# Patient Record
Sex: Female | Born: 1965 | Race: White | Hispanic: No | Smoking: Former smoker
Health system: Southern US, Community
[De-identification: ages and names within clinical notes are randomized; demographics above are authoritative.]

## PROBLEM LIST (undated history)

## (undated) DIAGNOSIS — Z923 Personal history of irradiation: Secondary | ICD-10-CM

## (undated) DIAGNOSIS — R748 Abnormal levels of other serum enzymes: Secondary | ICD-10-CM

## (undated) DIAGNOSIS — C50919 Malignant neoplasm of unspecified site of unspecified female breast: Secondary | ICD-10-CM

## (undated) DIAGNOSIS — M8580 Other specified disorders of bone density and structure, unspecified site: Secondary | ICD-10-CM

## (undated) DIAGNOSIS — K769 Liver disease, unspecified: Secondary | ICD-10-CM

## (undated) DIAGNOSIS — Z95828 Presence of other vascular implants and grafts: Secondary | ICD-10-CM

## (undated) DIAGNOSIS — Z9221 Personal history of antineoplastic chemotherapy: Secondary | ICD-10-CM

## (undated) DIAGNOSIS — Z87442 Personal history of urinary calculi: Secondary | ICD-10-CM

## (undated) DIAGNOSIS — Z9889 Other specified postprocedural states: Secondary | ICD-10-CM

## (undated) DIAGNOSIS — N189 Chronic kidney disease, unspecified: Secondary | ICD-10-CM

## (undated) DIAGNOSIS — N912 Amenorrhea, unspecified: Secondary | ICD-10-CM

## (undated) DIAGNOSIS — F419 Anxiety disorder, unspecified: Secondary | ICD-10-CM

## (undated) DIAGNOSIS — K760 Fatty (change of) liver, not elsewhere classified: Secondary | ICD-10-CM

## (undated) DIAGNOSIS — E559 Vitamin D deficiency, unspecified: Secondary | ICD-10-CM

## (undated) HISTORY — DX: Personal history of antineoplastic chemotherapy: Z92.21

## (undated) HISTORY — DX: Personal history of irradiation: Z92.3

## (undated) HISTORY — DX: Amenorrhea, unspecified: N91.2

## (undated) HISTORY — PX: HERNIA REPAIR: SHX51

## (undated) HISTORY — DX: Chronic kidney disease, unspecified: N18.9

## (undated) HISTORY — PX: ENDOMETRIAL ABLATION: SHX621

---

## 2015-02-05 ENCOUNTER — Ambulatory Visit (INDEPENDENT_AMBULATORY_CARE_PROVIDER_SITE_OTHER): Payer: 59 | Admitting: Podiatrist

## 2015-02-05 ENCOUNTER — Encounter: Payer: Self-pay | Admitting: Podiatrist

## 2015-02-05 VITALS — BP 121/64 | HR 80 | Resp 12

## 2015-02-05 DIAGNOSIS — B079 Viral wart, unspecified: Secondary | ICD-10-CM | POA: Diagnosis not present

## 2015-02-05 MED ORDER — IMIQUIMOD 5 % EX CREA: TOPICAL_CREAM | Freq: Every day | CUTANEOUS | Status: DC

## 2015-02-05 NOTE — Patient Instructions (Signed)
WARTS (Verrucae)  Warts are caused by a virus that has invaded the skin.  They are more common in young adults and children and a small percentage will resolve on their own.  There are many types of warts including mosaic warts (large flat), vulgaris (domed warts-have pearl like appearance), and plantar warts (flat or cauliflower like appearance).  Warts are highly contagious and may be picked up from any surface.  Warts thrive in a warm moist environment and are common near pools, showers, and locker room floors.  Any microscopic cut in the skin is where the virus enters and becomes a wart.  Warts are very difficult to treat and get rid of.  Patience is necessary in the treatment of this virus.  It may take months to cure and different methods may have to be used to get rid of your wart.  Standard Initial Treatment is: 1. Periodic debridement of the wart and application of Canthacur to each lesion (a blistering agent that will slough off the warty skin) 2. Dispensing of topical treatments/prescriptions to apply to the wart at home  Other options include: 1. Excision of the lesion-numbing the skin around the wart and cutting it out-requires daily soaks post-operatively and takes about 2-3 weeks to fully heal 2. Excision with CO2 Laser-Performed at the surgical center your foot is numbed up and the lesions are all cut out and then lasered with a high power laser.  Very good for multiple warts that are resistant. 3. Cimetidine (Tagamet)-Oral agent used in high does--has shown better results in children  How do I apply the standard topical treatments?  1. formadon (drying agent) Apply a tiny dab directly to the wart and cover (bandaid or  with duct tape) good to apply at night so the medication does not spread out to the good skin.  The skin will start to dry out.  Use a pumice stone daily to remove the white skin as best you can.  If the skin gets too raw and painful, discontinue for a few days then  resume. 2. Aldara (Imiquimod)-this is an immune response modifier.  They come in little packets so try to get at least 2 days out of each packet if you can.  Apply a small amount to the lesion and cover with duct tape.  Do not rub it in-let it absorb on its own.  Good to apply each morning.  Other Helpful Hints:  Wash shoes that can be washed in the washing machine 2-3 x per month with some bleach  Use Lysol in shoes that cannot be washed and wipe out with a cloth 1 x per week-allow to dry for 8 hours before wearing again  Use a bleach solution (1 part bleach to 3 parts water) in your tub or shower to reduce the spread of the virus to yourself and others  Use aqua socks or clean sandals when at the pool or locker room to reduce the chance of picking up the virus or spreading it to others

## 2015-02-05 NOTE — Progress Notes (Signed)
   Subjective:    Patient ID: Lisa Anthony, female    DOB: [birth year], 49 y.o.   MRN: [id number]  HPI  PT STATED LT BOTTOM HAVE WART AND BEEN HURTING FOR 2 YEARS. THE WART IS GETTING BIGGER, ESPECIALLY WHEN PUTTING PRESSURE. TRIED OTC WART REMOVAL BUT NO HELP  Review of Systems  All other systems reviewed and are negative.      Objective:   Physical Exam Patient is awake, alert, and oriented x 3.  In no acute distress.  Vascular status is intact with palpable pedal pulses at 2/4 DP and PT bilateral and capillary refill time within normal limits. Neurological sensation is also intact bilaterally via Semmes Weinstein monofilament at 5/5 sites. Light touch, vibratory sensation, Achilles tendon reflex is intact. Dermatological exam reveals skin color, turger and texture as normal. No open lesions present.  Musculature intact with dorsiflexion, plantarflexion, inversion, eversion.  Well circumscribed lesion is present plantar heel of the left foot.  Measures 57mm in diameter.  Multiple capillary budding throughout with loss of skin tension lines.  Shallow in appearance. Single, solitary lesion noted.      Assessment & Plan:  Plantar wart left foot  Plan:  Discussed topical versus excisional options.  She opted to try topical treatments first.  She will start with formadon alternated with aldara cream.  She will use this combination for a month and if there is no improvement at that time she will call and have the lesion removed surgically.

## 2015-12-11 ENCOUNTER — Emergency Department (HOSPITAL_COMMUNITY)
Admission: EM | Admit: 2015-12-11 | Discharge: 2015-12-11 | Disposition: A | Discharge status: Home / Self Care | Payer: 59 | Attending: Emergency Medicine | Admitting: Emergency Medicine

## 2015-12-11 ENCOUNTER — Emergency Department (HOSPITAL_COMMUNITY): Payer: 59

## 2015-12-11 ENCOUNTER — Encounter (HOSPITAL_COMMUNITY): Payer: Self-pay | Admitting: *Deleted

## 2015-12-11 DIAGNOSIS — Z79899 Other long term (current) drug therapy: Secondary | ICD-10-CM | POA: Diagnosis not present

## 2015-12-11 DIAGNOSIS — Z3202 Encounter for pregnancy test, result negative: Secondary | ICD-10-CM | POA: Insufficient documentation

## 2015-12-11 DIAGNOSIS — R079 Chest pain, unspecified: Secondary | ICD-10-CM | POA: Insufficient documentation

## 2015-12-11 DIAGNOSIS — R0789 Other chest pain: Secondary | ICD-10-CM | POA: Diagnosis not present

## 2015-12-11 LAB — BASIC METABOLIC PANEL
Anion gap: 11 (ref 5–15)
BUN: 9 mg/dL (ref 6–20)
CHLORIDE: 103 mmol/L (ref 101–111)
CO2: 22 mmol/L (ref 22–32)
CREATININE: 0.78 mg/dL (ref 0.44–1.00)
Calcium: 9 mg/dL (ref 8.9–10.3)
GFR calc Af Amer: >60 mL/min (ref >60)
GFR calc non Af Amer: >60 mL/min (ref >60)
GLUCOSE: 102 mg/dL — AB (ref 65–99)
Potassium: 4.1 mmol/L (ref 3.5–5.1)
SODIUM: 136 mmol/L (ref 135–145)

## 2015-12-11 LAB — D-DIMER, QUANTITATIVE (NOT AT ARMC): D DIMER QUANT: 0.51 ug{FEU}/mL — AB (ref 0.00–0.50)

## 2015-12-11 LAB — CBC
HEMATOCRIT: 37.2 % (ref 36.0–46.0)
HEMOGLOBIN: 12.8 g/dL (ref 12.0–15.0)
MCH: 32.1 pg (ref 26.0–34.0)
MCHC: 34.4 g/dL (ref 30.0–36.0)
MCV: 93.2 fL (ref 78.0–100.0)
Platelets: 287 10*3/uL (ref 150–400)
RBC: 3.99 MIL/uL (ref 3.87–5.11)
RDW: 12.8 % (ref 11.5–15.5)
WBC: 7 10*3/uL (ref 4.0–10.5)

## 2015-12-11 LAB — I-STAT TROPONIN, ED
TROPONIN I, POC: 0 ng/mL (ref 0.00–0.08)
Troponin i, poc: 0 ng/mL (ref 0.00–0.08)

## 2015-12-11 LAB — I-STAT BETA HCG BLOOD, ED (MC, WL, AP ONLY): I-stat hCG, quantitative: <5 m[IU]/mL (ref <5)

## 2015-12-11 MED ORDER — NAPROXEN 500 MG PO TABS: 500.0000 mg | ORAL_TABLET | Freq: Two times a day (BID) | ORAL | Status: DC

## 2015-12-11 MED ORDER — HYDROMORPHONE HCL 1 MG/ML IJ SOLN
0.5000 mg | Freq: Once | INTRAMUSCULAR | Status: AC
  Administered 2015-12-11: 0.5 mg via INTRAVENOUS
  Filled 2015-12-11: qty 1

## 2015-12-11 MED ORDER — OXYCODONE-ACETAMINOPHEN 5-325 MG PO TABS: 1.0000 | ORAL_TABLET | Freq: Four times a day (QID) | ORAL | Status: DC | PRN

## 2015-12-11 MED ORDER — OXYCODONE-ACETAMINOPHEN 5-325 MG PO TABS
1.0000 | ORAL_TABLET | Freq: Once | ORAL | Status: AC
  Administered 2015-12-11: 1 via ORAL
  Filled 2015-12-11: qty 1

## 2015-12-11 MED ORDER — IBUPROFEN 400 MG PO TABS
600.0000 mg | ORAL_TABLET | Freq: Once | ORAL | Status: AC
  Administered 2015-12-11: 600 mg via ORAL
  Filled 2015-12-11: qty 1

## 2015-12-11 MED ORDER — KETOROLAC TROMETHAMINE 30 MG/ML IJ SOLN
30.0000 mg | Freq: Once | INTRAMUSCULAR | Status: AC
  Administered 2015-12-11: 30 mg via INTRAVENOUS
  Filled 2015-12-11: qty 1

## 2015-12-11 MED ORDER — LORAZEPAM 2 MG/ML IJ SOLN
0.5000 mg | Freq: Once | INTRAMUSCULAR | Status: AC
  Administered 2015-12-11: 0.5 mg via INTRAVENOUS
  Filled 2015-12-11: qty 1

## 2015-12-11 MED ORDER — IOHEXOL 350 MG/ML SOLN
80.0000 mL | Freq: Once | INTRAVENOUS | Status: AC | PRN
  Administered 2015-12-11: 100 mL via INTRAVENOUS

## 2015-12-11 MED ORDER — NITROGLYCERIN 0.4 MG SL SUBL
0.4000 mg | SUBLINGUAL_TABLET | SUBLINGUAL | Status: DC | PRN
  Administered 2015-12-11 (×3): 0.4 mg via SUBLINGUAL
  Filled 2015-12-11: qty 1

## 2015-12-11 NOTE — ED Notes (Signed)
Family at bedside. 

## 2015-12-11 NOTE — ED Provider Notes (Signed)
The patient is a 50 year old female, she has a history of high cholesterol but has no other risk factors for coronary disease. Her father did have a heart attack in his 30s. She denies any risk factors for pulmonary embolism and she has not had any recent infections. She reports several days of chest discomfort on the left which radiates to her back, it also radiates to her neck and shoulder, it seems to be somewhat positional and his significant when she changes position for example it becomes severe when she tries to lay down on her back or chest down. She hardly slept at all last night because of the discomfort. She denies feeling short of breath, no fevers, no coughing, no swelling of the legs. On bedside exam the patient has a soft nontender abdomen, nontender left chest, supple neck, no lymphadenopathy, normal range of motion of the bilateral upper extremities without discomfort, no peripheral edema, her EKG is also unremarkable other than some nonspecific T-wave abnormalities. I do not have any old EKGs with which to compare this to. I have performed a bedside ultrasound which shows no signs of pericardial effusion, she does appear to have good contractility, these images were not archived. Initial labs are unremarkable, chest x-rays unremarkable, no signs of pneumothorax, no signs of myocardial infarction, the patient has been having symptoms that have been ongoing now for over 24 hours without relief thus I would expect her to have an elevated troponin or ischemic findings on the EKG more than nonspecific T waves. Second troponin, d-dimer  Multiple ECG's neg, CT performed Pt without acute findings   Medical screening examination/treatment/procedure(s) were conducted as a shared visit with non-physician practitioner(s) and myself.  I personally evaluated the patient during the encounter.  Clinical Impression:   Final diagnoses:  Chest pain, unspecified chest pain type     EKG  Interpretation  Date/Time:  Thursday December 11 2015 08:18:07 EDT Ventricular Rate:  93 PR Interval:  142 QRS Duration: 90 QT Interval:  364 QTC Calculation: 452 R Axis:   44 Text Interpretation:  Normal sinus rhythm Nonspecific T wave abnormality No old tracing to compare Confirmed by Eliyas Suddreth  MD, [place] ([id number]) on 12/11/2015 8:25:03 AM       EKG Interpretation  Date/Time:  Thursday December 11 2015 11:36:30 EDT Ventricular Rate:  80 PR Interval:  137 QRS Duration: 99 QT Interval:  391 QTC Calculation: 451 R Axis:   64 Text Interpretation:  Sinus rhythm Low voltage, precordial leads RSR' in V1 or V2, right VCD or RVH Borderline T abnormalities, anterior leads Since last tracing T wave abnormality have improved Confirmed by Sabra Heck  MD, [place] ([id number]) on 12/11/2015 12:41:14 PM         Noemi Chapel, MD 12/13/15 [phone]

## 2015-12-11 NOTE — ED Provider Notes (Signed)
CSN: [id number]     Arrival date & time 12/11/15  [id number] History   First MD Initiated Contact with Patient 12/11/15 [phone]     Chief Complaint  Patient presents with  . Chest Pain   HPI  Lisa Anthony is a 50 y.o. female PMH significant for hyperlipidemia presenting with a 3 day history of chest pain. She describes her chest pain is left-sided to midsternal in location, radiating to her back, worse with lying down, sharp, constant, 7 out of 10 pain scale. She endorses left arm weakness. She states her father suffered a massive heart attack when he was 50 years old. She denies fevers, chills, shortness of breath, abdominal pain, nausea, vomiting, recent surgeries, leg swelling or discoloration.  History reviewed. No pertinent past medical history. History reviewed. No pertinent past surgical history. No family history on file. Social History  Substance Use Topics  . Smoking status: Never Smoker   . Smokeless tobacco: None  . Alcohol Use: Yes   OB History    No data available     Review of Systems  Ten systems are reviewed and are negative for acute change except as noted in the HPI  Allergies  Review of patient's allergies indicates no known allergies.  Home Medications   Prior to Admission medications   Medication Sig Start Date End Date Taking? Authorizing Provider  imiquimod (ALDARA) 5 % cream Apply topically at bedtime. 02/05/15   Bronson Ing, DPM   BP 145/90 mmHg  Pulse 89  Temp(Src) 97.9 F (36.6 C) (Oral)  Resp 14  Ht 5\' 6"  (1.676 m)  Wt 61.236 kg  BMI 21.80 kg/m2  SpO2 100%  LMP 11/27/2015 Physical Exam  Constitutional: She appears well-developed and well-nourished. No distress.  HENT:  Head: Normocephalic and atraumatic.  Mouth/Throat: Oropharynx is clear and moist. No oropharyngeal exudate.  Eyes: Conjunctivae are normal. Pupils are equal, round, and reactive to light. Right eye exhibits no discharge. Left eye exhibits no discharge. No scleral icterus.   Neck: No tracheal deviation present.  Cardiovascular: Normal rate, regular rhythm, normal heart sounds and intact distal pulses.  Exam reveals no gallop and no friction rub.   No murmur heard. Pulmonary/Chest: Effort normal and breath sounds normal. No respiratory distress. She has no wheezes. She has no rales. She exhibits no tenderness.  Abdominal: Soft. Bowel sounds are normal. She exhibits no distension and no mass. There is no tenderness. There is no rebound and no guarding.  Musculoskeletal: She exhibits no edema.  Lymphadenopathy:    She has no cervical adenopathy.  Neurological: She is alert. Coordination normal.  Skin: Skin is warm and dry. No rash noted. She is not diaphoretic. No erythema.  Psychiatric: She has a normal mood and affect. Her behavior is normal.  Nursing note and vitals reviewed.  ED Course  Procedures  Labs Review Labs Reviewed  BASIC METABOLIC PANEL - Abnormal; Notable for the following:    Glucose, Bld 102 (*)    All other components within normal limits  D-DIMER, QUANTITATIVE (NOT AT [facility]) - Abnormal; Notable for the following:    D-Dimer, Quant 0.51 (*)    All other components within normal limits  CBC  I-STAT TROPOININ, ED  I-STAT TROPOININ, ED  I-STAT BETA HCG BLOOD, ED ([facility], [facility], [facility] ONLY)   Imaging Review Dg Chest 2 View  12/11/2015  CLINICAL DATA:  Chest pain for 3 day EXAM: CHEST  2 VIEW COMPARISON:  None. FINDINGS: Normal heart size. Lungs clear.  No pneumothorax. No pleural effusion. IMPRESSION: No active cardiopulmonary disease. Electronically Signed   By: Marybelle Killings M.D.   On: 12/11/2015 08:50   Ct Angio Chest Pe W/cm &/or Wo Cm  12/11/2015  CLINICAL DATA:  Chest tightness and heaviness. EXAM: CT ANGIOGRAPHY CHEST WITH CONTRAST TECHNIQUE: Multidetector CT imaging of the chest was performed using the standard protocol during bolus administration of intravenous contrast. Multiplanar CT image reconstructions and MIPs were obtained to evaluate the  vascular anatomy. CONTRAST:  137mL OMNIPAQUE IOHEXOL 350 MG/ML SOLN COMPARISON:  None. FINDINGS: There is adequate opacification of the pulmonary arteries. There is no pulmonary embolus. The main pulmonary artery, right main pulmonary artery and left main pulmonary arteries are normal in size. The heart size is normal. There is no pericardial effusion. The lungs are clear. There is no focal consolidation, pleural effusion or pneumothorax. There is no axillary, hilar, or mediastinal adenopathy. There is no lytic or blastic osseous lesion. The visualized portions of the upper abdomen are unremarkable. Review of the MIP images confirms the above findings. IMPRESSION: No evidence of pulmonary embolus. Electronically Signed   By: Kathreen Devoid   On: 12/11/2015 16:20   I have personally reviewed and evaluated these images and lab results as part of my medical decision-making.   EKG Interpretation   Date/Time:  Thursday December 11 2015 08:18:07 EDT Ventricular Rate:  93 PR Interval:  142 QRS Duration: 90 QT Interval:  364 QTC Calculation: 452 R Axis:   44 Text Interpretation:  Normal sinus rhythm Nonspecific T wave abnormality  No old tracing to compare Confirmed by MILLER  MD, [place] ([id number]) on  12/11/2015 8:25:03 AM    EKG Interpretation  Date/Time:  Thursday December 11 2015 11:36:30 EDT Ventricular Rate:  80 PR Interval:  137 QRS Duration: 99 QT Interval:  391 QTC Calculation: 451 R Axis:   64 Text Interpretation:  Sinus rhythm Low voltage, precordial leads RSR' in V1 or V2, right VCD or RVH Borderline T abnormalities, anterior leads Since last tracing T wave abnormality have improved Confirmed by MILLER  MD, [place] ([id number]) on 12/11/2015 12:41:14 PM        MDM   Final diagnoses:  Chest pain, unspecified chest pain type   Patient nontoxic-appearing, vital signs stable. No chest tenderness on exam. Based on patient history and physical exam, most likely etiologies include anxiety versus ACS  (although atypical in nature) versus pericarditis versus musculoskeletal pain. Less likely etiologies include Prinzmetal's/cocaine-induced angina, pericarditis/pericardial effusion, cardiac tamponade, constrictive pericarditis, myocarditis, aortic dissection, thoracic aortic aneurysm, CHF/acute pulmonary edema, pneumonia, pneumothorax, tension pneumothorax, pulmonary embolism, pulmonary HTN, GERD, esophageal spasm, Mallory-Weiss tear, Boerhaave syndrome, peptic ulcer diease, biliary disease, pancreatitis, herpes zoster.  Troponin, BMP, CBC, chest x-ray unremarkable. EKG demonstrates nonspecific T wave abnormality. Heart score of 3. D-dimer with nonspecific elevation of 0.51.  Patient crying and states "there is no way I can go home with this pain" after nitro, toradol, percocet x 2. She states her pain level, which was 10/10 pain scale initially is still only 9/10 pain scale. Patient given 0.5 mg ativan and 0.5 mg dilaudid. I feel her pain is likely pericarditis vs anxiety. CTA chest unremarkable.  Troponin x 2, EKG x 2 unremarkable.  Patient feeling improved after ativan and dilaudid.  Patient is to be discharged with recommendation to follow up with cardiology in regards to today's hospital visit.  Case has been discussed with and seen by Dr. Sabra Heck who agrees with the above plan to discharge.  [place], PA-C 12/12/15 [place], MD 12/13/15 [phone]

## 2015-12-11 NOTE — ED Notes (Signed)
Pt reports chest pain and SOB that is constant but worse with lying flat. Pt reports that her left arm is sore. this started on Monday and has become progressively  Worse. Pt reports that she has taken medications to help her sleep but they are not working.

## 2015-12-11 NOTE — ED Notes (Signed)
MD at bedside. 

## 2015-12-11 NOTE — ED Notes (Signed)
Pt is crying and in discomfort. EKG completed. PA notified. New order given.

## 2015-12-11 NOTE — Discharge Instructions (Signed)
Ms. Lisa Anthony,  Nice meeting you! Please follow-up with cardiologist. Return to the emergency department if you develop increased chest pain, nausea/vomiting, shortness of breath. Feel better soon!  S. Wendie Simmer, PA-C

## 2015-12-13 ENCOUNTER — Emergency Department (HOSPITAL_COMMUNITY): Payer: 59

## 2015-12-13 ENCOUNTER — Encounter (HOSPITAL_COMMUNITY): Payer: Self-pay

## 2015-12-13 ENCOUNTER — Emergency Department (HOSPITAL_COMMUNITY)
Admission: EM | Admit: 2015-12-13 | Discharge: 2015-12-13 | Disposition: A | Discharge status: Home / Self Care | Payer: 59 | Attending: Emergency Medicine | Admitting: Emergency Medicine

## 2015-12-13 DIAGNOSIS — M5412 Radiculopathy, cervical region: Secondary | ICD-10-CM | POA: Diagnosis not present

## 2015-12-13 DIAGNOSIS — M542 Cervicalgia: Secondary | ICD-10-CM | POA: Diagnosis not present

## 2015-12-13 DIAGNOSIS — Z791 Long term (current) use of non-steroidal anti-inflammatories (NSAID): Secondary | ICD-10-CM | POA: Diagnosis not present

## 2015-12-13 DIAGNOSIS — M79602 Pain in left arm: Secondary | ICD-10-CM | POA: Insufficient documentation

## 2015-12-13 DIAGNOSIS — R200 Anesthesia of skin: Secondary | ICD-10-CM | POA: Insufficient documentation

## 2015-12-13 DIAGNOSIS — R079 Chest pain, unspecified: Secondary | ICD-10-CM | POA: Diagnosis not present

## 2015-12-13 DIAGNOSIS — R0789 Other chest pain: Secondary | ICD-10-CM

## 2015-12-13 LAB — BASIC METABOLIC PANEL
Anion gap: 9 (ref 5–15)
BUN: 10 mg/dL (ref 6–20)
CHLORIDE: 103 mmol/L (ref 101–111)
CO2: 25 mmol/L (ref 22–32)
CREATININE: 0.62 mg/dL (ref 0.44–1.00)
Calcium: 9.2 mg/dL (ref 8.9–10.3)
GFR calc Af Amer: >60 mL/min (ref >60)
GFR calc non Af Amer: >60 mL/min (ref >60)
Glucose, Bld: 108 mg/dL — ABNORMAL HIGH (ref 65–99)
POTASSIUM: 3.6 mmol/L (ref 3.5–5.1)
Sodium: 137 mmol/L (ref 135–145)

## 2015-12-13 LAB — I-STAT TROPONIN, ED: Troponin i, poc: 0 ng/mL (ref 0.00–0.08)

## 2015-12-13 LAB — CBC
HEMATOCRIT: 35.4 % — AB (ref 36.0–46.0)
HEMOGLOBIN: 12.1 g/dL (ref 12.0–15.0)
MCH: 32 pg (ref 26.0–34.0)
MCHC: 34.2 g/dL (ref 30.0–36.0)
MCV: 93.7 fL (ref 78.0–100.0)
Platelets: 265 10*3/uL (ref 150–400)
RBC: 3.78 MIL/uL — AB (ref 3.87–5.11)
RDW: 12.6 % (ref 11.5–15.5)
WBC: 7.5 10*3/uL (ref 4.0–10.5)

## 2015-12-13 MED ORDER — HYDROMORPHONE HCL 1 MG/ML IJ SOLN
1.0000 mg | Freq: Once | INTRAMUSCULAR | Status: AC
  Administered 2015-12-13: 1 mg via INTRAVENOUS
  Filled 2015-12-13: qty 1

## 2015-12-13 MED ORDER — OXYCODONE-ACETAMINOPHEN 5-325 MG PO TABS
1.0000 | ORAL_TABLET | ORAL | Status: DC | PRN
Start: 2015-12-13 — End: 2015-12-13
  Administered 2015-12-13: 1 via ORAL
  Filled 2015-12-13: qty 1

## 2015-12-13 MED ORDER — ONDANSETRON HCL 4 MG/2ML IJ SOLN
4.0000 mg | Freq: Once | INTRAMUSCULAR | Status: AC
  Administered 2015-12-13: 4 mg via INTRAVENOUS
  Filled 2015-12-13: qty 2

## 2015-12-13 MED ORDER — ONDANSETRON 4 MG PO TBDP: 4.0000 mg | ORAL_TABLET | Freq: Three times a day (TID) | ORAL | Status: DC | PRN

## 2015-12-13 MED ORDER — HYDROMORPHONE HCL 2 MG PO TABS: 2.0000 mg | ORAL_TABLET | Freq: Four times a day (QID) | ORAL | Status: DC | PRN

## 2015-12-13 MED ORDER — METHYLPREDNISOLONE SODIUM SUCC 125 MG IJ SOLR
125.0000 mg | Freq: Once | INTRAMUSCULAR | Status: AC
  Administered 2015-12-13: 125 mg via INTRAVENOUS
  Filled 2015-12-13: qty 2

## 2015-12-13 MED ORDER — PREDNISONE 10 MG (21) PO TBPK: 10.0000 mg | ORAL_TABLET | Freq: Every day | ORAL | Status: DC

## 2015-12-13 NOTE — Discharge Instructions (Signed)
Do not take Percocet with Dilaudid. Both of these medications are narcotics and may make you very drowsy if you took them together. Please continue her naproxen twice a day with food as this is an anti-inflammatory. Please start your prednisone tomorrow morning on March 26. I recommend close outpatient follow-up as you likely need an MRI of your cervical spine non-emergently. If you develop worsening pain is uncontrolled, new chest pain or shortness of breath, numbness or weakness on both sides of your body, difficulty walking, difficulty holding her bowel or bladder, please return to the hospital.   Cervical Radiculopathy Cervical radiculopathy happens when a nerve in the neck (cervical nerve) is pinched or bruised. This condition can develop because of an injury or as part of the normal aging process. Pressure on the cervical nerves can cause pain or numbness that runs from the neck all the way down into the arm and fingers. Usually, this condition gets better with rest. Treatment may be needed if the condition does not improve.  CAUSES This condition may be caused by:  Injury.  Slipped (herniated) disk.  Muscle tightness in the neck because of overuse.  Arthritis.  Breakdown or degeneration in the bones and joints of the spine (spondylosis) due to aging.  Bone spurs that may develop near the cervical nerves. SYMPTOMS Symptoms of this condition include:  Pain that runs from the neck to the arm and hand. The pain can be severe or irritating. It may be worse when the neck is moved.  Numbness or weakness in the affected arm and hand. DIAGNOSIS This condition may be diagnosed based on symptoms, medical history, and a physical exam. You may also have tests, including:  X-rays.  CT scan.  MRI.  Electromyogram (EMG).  Nerve conduction tests. TREATMENT In many cases, treatment is not needed for this condition. With rest, the condition usually gets better over time. If treatment is  needed, options may include:  Wearing a soft neck collar for short periods of time.  Physical therapy to strengthen your neck muscles.  Medicines, such as NSAIDs, oral corticosteroids, or spinal injections.  Surgery. This may be needed if other treatments do not help. Various types of surgery may be done depending on the cause of your problems. HOME CARE INSTRUCTIONS Managing Pain  Take over-the-counter and prescription medicines only as told by your health care provider.  If directed, apply ice to the affected area.  Put ice in a plastic bag.  Place a towel between your skin and the bag.  Leave the ice on for 20 minutes, 2-3 times per day.  If ice does not help, you can try using heat. Take a warm shower or warm bath, or use a heat pack as told by your health care provider.  Try a gentle neck and shoulder massage to help relieve symptoms. Activity  Rest as needed. Follow instructions from your health care provider about any restrictions on activities.  Do stretching and strengthening exercises as told by your health care provider or physical therapist. General Instructions  If you were given a soft collar, wear it as told by your health care provider.  Use a flat pillow when you sleep.  Keep all follow-up visits as told by your health care provider. This is important. SEEK MEDICAL CARE IF:  Your condition does not improve with treatment. SEEK IMMEDIATE MEDICAL CARE IF:  Your pain gets much worse and cannot be controlled with medicines.  You have weakness or numbness in your hand, arm,  face, or leg.  You have a high fever.  You have a stiff, rigid neck.  You lose control of your bowels or your bladder (have incontinence).  You have trouble with walking, balance, or speaking.   This information is not intended to replace advice given to you by your health care provider. Make sure you discuss any questions you have with your health care provider.   Document  Released: 06/01/2001 Document Revised: 05/28/2015 Document Reviewed: 10/31/2014 Elsevier Interactive Patient Education 2016 [employer]  Chest Wall Pain Chest wall pain is pain in or around the bones and muscles of your chest. Sometimes, an injury causes this pain. Sometimes, the cause may not be known. This pain may take several weeks or longer to get better. HOME CARE INSTRUCTIONS  Pay attention to any changes in your symptoms. Take these actions to help with your pain:   Rest as told by your health care provider.   Avoid activities that cause pain. These include any activities that use your chest muscles or your abdominal and side muscles to lift heavy items.   If directed, apply ice to the painful area:  Put ice in a plastic bag.  Place a towel between your skin and the bag.  Leave the ice on for 20 minutes, 2-3 times per day.  Take over-the-counter and prescription medicines only as told by your health care provider.  Do not use tobacco products, including cigarettes, chewing tobacco, and e-cigarettes. If you need help quitting, ask your health care provider.  Keep all follow-up visits as told by your health care provider. This is important. SEEK MEDICAL CARE IF:  You have a fever.  Your chest pain becomes worse.  You have new symptoms. SEEK IMMEDIATE MEDICAL CARE IF:  You have nausea or vomiting.  You feel sweaty or light-headed.  You have a cough with phlegm (sputum) or you cough up blood.  You develop shortness of breath.   This information is not intended to replace advice given to you by your health care provider. Make sure you discuss any questions you have with your health care provider.   Document Released: 09/06/2005 Document Revised: 05/28/2015 Document Reviewed: 12/02/2014 Elsevier Interactive Patient Education [employer].

## 2015-12-13 NOTE — ED Notes (Signed)
Pt here with central CP and left arm pain, pain onset Monday. She was seen in ER on Thursday but states the pain has worsened and has not relieved. Emesis x 2 today.

## 2015-12-13 NOTE — ED Notes (Signed)
Registration at the bedside.

## 2015-12-13 NOTE — ED Notes (Signed)
MD at bedside. 

## 2015-12-13 NOTE — ED Notes (Signed)
Patient speaking with pharmacy

## 2015-12-13 NOTE — ED Provider Notes (Signed)
By signing my name below, I, [name], attest that this documentation has been prepared under the direction and in the presence of [employer], DO. Electronically Signed: [name], ED Scribe. 12/13/2015. 3:57 AM.  TIME SEEN: 3:53 AM   CHIEF COMPLAINT: CP  HPI: HPI Comments: Lisa Anthony is a 50 y.o. female who presents to the Emergency Department complaining of worsening constant CP onset 5 days prior. Pt is also reporting neck pain, back pain and left aching arm pain and numbness. Reports that the pain is worse when laying flat on her back or on her left side. Patient's pain is also worse with bending her neck and states this causes her to have a sharp pulling pain in her anterior chest. Pt states she has been taking oxycodone and naproxen with no relief. Pt reports nausea and vomiting from the pain medications. Pt denies fever or cough. No dizziness, diaphoresis. Pt denies recent injury or trauma. She denies feeling short of breath. No cough. No lower extremity swelling or pain. Was seen in the emergency department on March 23 and had normal labs other than a mildly elevated d-dimer. She had a negative CT scan that showed no pulmonary embolus or dissection.   She denies any weakness in her extremities. No bowel or bladder incontinence. No urinary retention. No difficulty ambulating. She denies any history of IV drug abuse, being immunocompromised. No history of epidural injections or neck or back surgery. She has not had any fever.   ROS: See HPI Constitutional: no fever  Eyes: no drainage  ENT: no runny nose   Cardiovascular:  chest pain  Resp: no SOB  GI: no vomiting GU: no dysuria Integumentary: no rash  Allergy: no hives  Musculoskeletal: no leg swelling  Neurological: no slurred speech ROS otherwise negative  PAST MEDICAL HISTORY/PAST SURGICAL HISTORY:  History reviewed. No pertinent past medical history.  MEDICATIONS:  Prior to Admission medications    Medication Sig Start Date End Date Taking? Authorizing Provider  naproxen (NAPROSYN) 500 MG tablet Take 1 tablet (500 mg total) by mouth 2 (two) times daily. 12/11/15  Yes [place], PA-C  oxyCODONE-acetaminophen (PERCOCET/ROXICET) 5-325 MG tablet Take 1-2 tablets by mouth every 6 (six) hours as needed for severe pain. 12/11/15  Yes [place] Lions, PA-C  imiquimod (ALDARA) 5 % cream Apply topically at bedtime. Patient not taking: Reported on 12/11/2015 02/05/15   Bronson Ing, DPM    ALLERGIES:  No Known Allergies  SOCIAL HISTORY:  Social History  Substance Use Topics  . Smoking status: Never Smoker   . Smokeless tobacco: Not on file  . Alcohol Use: Yes    FAMILY HISTORY: No family history on file.  EXAM: BP 130/91 mmHg  Pulse 70  Temp(Src) 98.2 F (36.8 C) (Oral)  Resp 16  SpO2 99%  LMP 11/27/2015 CONSTITUTIONAL: Alert and oriented and responds appropriately to questions. Afebrile, nontoxic, does appear uncomfortable HEAD: Normocephalic EYES: Conjunctivae clear, PERRL ENT: normal nose; no rhinorrhea; moist mucous membranes NECK: Supple, no meningismus, no LAD, tender to palpation over her posterior neck diffusely without midline step-off or deformity, patient has pain with flexion of her neck  CARD: RRR; S1 and S2 appreciated; no murmurs, no clicks, no rubs, no gallops CHEST:  Left chest wall is tender to palpation without crepitus, ecchymosis or deformity. No rash noted. RESP: Normal chest excursion without splinting or tachypnea; breath sounds clear and equal bilaterally; no wheezes, no rhonchi, no rales, no hypoxia or respiratory  distress, speaking full sentences ABD/GI: Normal bowel sounds; non-distended; soft, non-tender, no rebound, no guarding, no peritoneal signs BACK:  The back appears normal and is non-tender to palpation, there is no CVA tenderness EXT: Normal ROM in all joints; non-tender to palpation; no edema; normal capillary refill; no  cyanosis, no calf tenderness or swelling, no bony tenderness to palpation of the left arm, compartments are soft, no joint effusion, no erythema or warmth, 2+ radial pulses bilaterally    SKIN: Normal color for age and race; warm; no rash NEURO: Moves all extremities equally, patient reports some decreased sensation in the left arm compared to the right but otherwise sensation to light touch intact diffusely, cranial nerves II through XII intact, strength 5/5 in all 4 external ears, normal grip strength in bilateral upper extremities, normal gait, normal reflexes in bilateral upper extremities PSYCH: The patient's mood and manner are appropriate. Grooming and personal hygiene are appropriate.  MEDICAL DECISION MAKING: Patient here with complaints of chest pain, neck pain and back pain. It seems to be musculoskeletal as it is reproducible with palpation and movement. Was here several days ago for a workup and had negative cardiac labs, negative CT of her chest. Pain is been constant for several days. Repeat troponin here today is negative. EKG shows no new ischemic abnormality. My suspicion that this is ACS is very low. Doubt dissection or PE given recent normal CT scan. I suspect that she has radiculopathy. Pain is worse with movement of the neck and palpation and she does have some numbness that goes down into her left arm. Normal strength and normal reflexes in bilateral upper extremities.  I doubt that she has spinal stenosis, epidural abscess or hematoma, discitis or transverse myelitis. She has no red flag symptoms. I do not feel this time she needs emergent MRI of her cervical spine but states she will need one as an outpatient. I provided her with outpatient follow-up information. She states that she is taking oxycodone at home intermittently without any relief. She states it is causing her to have nausea. We will discharge her with prescription of Dilaudid to take instead as a dose of IV Dilaudid in the  emergency department has significantly helped with her pain. We'll also discharge with prescription for Zofran. Have advised her to continue her naproxen twice a day with food. We'll also discharge on steroid taper. She has received a dose of IV Solu-Medrol in the emergency department.   At this time, I do not feel there is any life-threatening condition present. I feel the patient is safe to be discharged home. I have reviewed and discussed all results (EKG, imaging, lab, urine as appropriate), exam findings with patient. I have reviewed nursing notes and appropriate previous records.  I feel the patient is safe to be discharged home without further emergent workup. Discussed usual and customary return precautions. Patient and family (if present) verbalize understanding and are comfortable with this plan.  Patient will follow-up with their primary care provider. If they do not have a primary care provider, information for follow-up has been provided to them. All questions have been answered.    EKG Interpretation  Date/Time:  Saturday December 13 2015 02:11:58 EDT Ventricular Rate:  76 PR Interval:  142 QRS Duration: 92 QT Interval:  380 QTC Calculation: 427 R Axis:   59 Text Interpretation:  Normal sinus rhythm Nonspecific ST and T wave abnormality Abnormal ECG No significant change since last tracing Confirmed by Shanekqua Schaper,  DO, Sudais Banghart (  [id number]) on 12/13/2015 3:31:46 AM        I personally performed the services described in this documentation, which was scribed in my presence. The recorded information has been reviewed and is accurate.    [place], DO 12/13/15 [phone]

## 2015-12-31 ENCOUNTER — Ambulatory Visit: Payer: 59 | Admitting: Cardiovascular Disease

## 2016-01-05 DIAGNOSIS — G933 Postviral fatigue syndrome: Secondary | ICD-10-CM | POA: Diagnosis not present

## 2016-01-05 DIAGNOSIS — R21 Rash and other nonspecific skin eruption: Secondary | ICD-10-CM | POA: Diagnosis not present

## 2016-01-05 DIAGNOSIS — Z09 Encounter for follow-up examination after completed treatment for conditions other than malignant neoplasm: Secondary | ICD-10-CM | POA: Diagnosis not present

## 2016-01-05 DIAGNOSIS — R509 Fever, unspecified: Secondary | ICD-10-CM | POA: Diagnosis not present

## 2016-01-05 DIAGNOSIS — R700 Elevated erythrocyte sedimentation rate: Secondary | ICD-10-CM | POA: Diagnosis not present

## 2016-01-05 DIAGNOSIS — R5383 Other fatigue: Secondary | ICD-10-CM | POA: Diagnosis not present

## 2016-09-30 ENCOUNTER — Other Ambulatory Visit: Payer: Self-pay | Admitting: Nurse Practitioner

## 2016-09-30 DIAGNOSIS — Z1239 Encounter for other screening for malignant neoplasm of breast: Secondary | ICD-10-CM

## 2016-10-14 ENCOUNTER — Ambulatory Visit
Admission: RE | Admit: 2016-10-14 | Discharge: 2016-10-14 | Disposition: A | Discharge status: Home / Self Care | Payer: 59 | Source: Ambulatory Visit | Attending: Nurse Practitioner | Admitting: Nurse Practitioner

## 2016-10-14 DIAGNOSIS — Z1231 Encounter for screening mammogram for malignant neoplasm of breast: Secondary | ICD-10-CM | POA: Diagnosis not present

## 2016-10-14 DIAGNOSIS — Z1239 Encounter for other screening for malignant neoplasm of breast: Secondary | ICD-10-CM

## 2016-10-18 ENCOUNTER — Other Ambulatory Visit: Payer: Self-pay | Admitting: Nurse Practitioner

## 2016-10-18 DIAGNOSIS — R928 Other abnormal and inconclusive findings on diagnostic imaging of breast: Secondary | ICD-10-CM

## 2016-10-20 ENCOUNTER — Ambulatory Visit
Admission: RE | Admit: 2016-10-20 | Discharge: 2016-10-20 | Disposition: A | Discharge status: Home / Self Care | Payer: 59 | Source: Ambulatory Visit | Attending: Nurse Practitioner | Admitting: Nurse Practitioner

## 2016-10-20 ENCOUNTER — Other Ambulatory Visit: Payer: Self-pay | Admitting: Nurse Practitioner

## 2016-10-20 DIAGNOSIS — R921 Mammographic calcification found on diagnostic imaging of breast: Secondary | ICD-10-CM | POA: Diagnosis not present

## 2016-10-20 DIAGNOSIS — C50211 Malignant neoplasm of upper-inner quadrant of right female breast: Secondary | ICD-10-CM | POA: Diagnosis not present

## 2016-10-20 DIAGNOSIS — R928 Other abnormal and inconclusive findings on diagnostic imaging of breast: Secondary | ICD-10-CM

## 2016-10-20 DIAGNOSIS — N6310 Unspecified lump in the right breast, unspecified quadrant: Secondary | ICD-10-CM

## 2016-10-20 DIAGNOSIS — N6011 Diffuse cystic mastopathy of right breast: Secondary | ICD-10-CM | POA: Diagnosis not present

## 2016-10-20 DIAGNOSIS — N6312 Unspecified lump in the right breast, upper inner quadrant: Secondary | ICD-10-CM | POA: Diagnosis not present

## 2016-10-21 ENCOUNTER — Other Ambulatory Visit: Payer: Self-pay | Admitting: Nurse Practitioner

## 2016-10-21 DIAGNOSIS — R2231 Localized swelling, mass and lump, right upper limb: Secondary | ICD-10-CM

## 2016-10-22 ENCOUNTER — Telehealth: Payer: Self-pay | Admitting: *Deleted

## 2016-10-22 NOTE — Telephone Encounter (Signed)
Confirmed [facility] for 10/27/16 at 1215 .  Instructions and contact information given.

## 2016-10-22 NOTE — Telephone Encounter (Signed)
Left vm regarding [place] on 10/27/16. Contact information provided.

## 2016-10-25 ENCOUNTER — Ambulatory Visit
Admission: RE | Admit: 2016-10-25 | Discharge: 2016-10-25 | Disposition: A | Discharge status: Home / Self Care | Payer: 59 | Source: Ambulatory Visit | Attending: Nurse Practitioner | Admitting: Nurse Practitioner

## 2016-10-25 DIAGNOSIS — R2231 Localized swelling, mass and lump, right upper limb: Secondary | ICD-10-CM

## 2016-10-25 DIAGNOSIS — R590 Localized enlarged lymph nodes: Secondary | ICD-10-CM | POA: Diagnosis not present

## 2016-10-25 HISTORY — DX: Malignant neoplasm of unspecified site of unspecified female breast: C50.919

## 2016-10-26 ENCOUNTER — Other Ambulatory Visit: Payer: Self-pay | Admitting: *Deleted

## 2016-10-26 ENCOUNTER — Other Ambulatory Visit: Payer: Self-pay | Admitting: Emergency Medicine

## 2016-10-26 DIAGNOSIS — Z170 Estrogen receptor positive status [ER+]: Principal | ICD-10-CM

## 2016-10-26 DIAGNOSIS — C50211 Malignant neoplasm of upper-inner quadrant of right female breast: Secondary | ICD-10-CM

## 2016-10-27 ENCOUNTER — Encounter: Payer: Self-pay | Admitting: Radiation Oncology

## 2016-10-27 ENCOUNTER — Ambulatory Visit: Payer: 59 | Attending: General Surgery | Admitting: Physical Therapy

## 2016-10-27 ENCOUNTER — Ambulatory Visit (HOSPITAL_BASED_OUTPATIENT_CLINIC_OR_DEPARTMENT_OTHER): Payer: 59 | Admitting: Hematology and Oncology

## 2016-10-27 ENCOUNTER — Encounter: Payer: Self-pay | Admitting: Hematology and Oncology

## 2016-10-27 ENCOUNTER — Ambulatory Visit
Admission: RE | Admit: 2016-10-27 | Discharge: 2016-10-27 | Disposition: A | Discharge status: Home / Self Care | Payer: 59 | Source: Ambulatory Visit | Attending: Radiation Oncology | Admitting: Radiation Oncology

## 2016-10-27 ENCOUNTER — Ambulatory Visit: Payer: Self-pay | Admitting: General Surgery

## 2016-10-27 ENCOUNTER — Other Ambulatory Visit (HOSPITAL_BASED_OUTPATIENT_CLINIC_OR_DEPARTMENT_OTHER): Payer: 59

## 2016-10-27 ENCOUNTER — Encounter: Payer: Self-pay | Admitting: Physical Therapy

## 2016-10-27 DIAGNOSIS — R293 Abnormal posture: Secondary | ICD-10-CM | POA: Insufficient documentation

## 2016-10-27 DIAGNOSIS — C50411 Malignant neoplasm of upper-outer quadrant of right female breast: Secondary | ICD-10-CM | POA: Diagnosis not present

## 2016-10-27 DIAGNOSIS — C50211 Malignant neoplasm of upper-inner quadrant of right female breast: Secondary | ICD-10-CM | POA: Diagnosis not present

## 2016-10-27 DIAGNOSIS — Z170 Estrogen receptor positive status [ER+]: Secondary | ICD-10-CM | POA: Insufficient documentation

## 2016-10-27 LAB — COMPREHENSIVE METABOLIC PANEL
ALBUMIN: 4.2 g/dL (ref 3.5–5.0)
ALK PHOS: 64 U/L (ref 40–150)
ALT: 22 U/L (ref 0–55)
ANION GAP: 10 meq/L (ref 3–11)
AST: 22 U/L (ref 5–34)
BUN: 12 mg/dL (ref 7.0–26.0)
CALCIUM: 9.6 mg/dL (ref 8.4–10.4)
CO2: 25 mEq/L (ref 22–29)
Chloride: 101 mEq/L (ref 98–109)
Creatinine: 0.8 mg/dL (ref 0.6–1.1)
EGFR: 90 mL/min/{1.73_m2} — ABNORMAL LOW (ref >90)
Glucose: 82 mg/dl (ref 70–140)
POTASSIUM: 3.9 meq/L (ref 3.5–5.1)
Sodium: 137 mEq/L (ref 136–145)
Total Bilirubin: 0.77 mg/dL (ref 0.20–1.20)
Total Protein: 7.5 g/dL (ref 6.4–8.3)

## 2016-10-27 LAB — CBC WITH DIFFERENTIAL/PLATELET
BASO%: 1 % (ref 0.0–2.0)
BASOS ABS: 0.1 10*3/uL (ref 0.0–0.1)
EOS ABS: 0.1 10*3/uL (ref 0.0–0.5)
EOS%: 0.8 % (ref 0.0–7.0)
HEMATOCRIT: 38.7 % (ref 34.8–46.6)
HEMOGLOBIN: 13.2 g/dL (ref 11.6–15.9)
LYMPH#: 2.4 10*3/uL (ref 0.9–3.3)
LYMPH%: 28.5 % (ref 14.0–49.7)
MCH: 32.3 pg (ref 25.1–34.0)
MCHC: 34.1 g/dL (ref 31.5–36.0)
MCV: 94.6 fL (ref 79.5–101.0)
MONO#: 0.8 10*3/uL (ref 0.1–0.9)
MONO%: 9.2 % (ref 0.0–14.0)
NEUT#: 5.1 10*3/uL (ref 1.5–6.5)
NEUT%: 60.5 % (ref 38.4–76.8)
PLATELETS: 304 10*3/uL (ref 145–400)
RBC: 4.09 10*6/uL (ref 3.70–5.45)
RDW: 13.2 % (ref 11.2–14.5)
WBC: 8.4 10*3/uL (ref 3.9–10.3)

## 2016-10-27 NOTE — Assessment & Plan Note (Signed)
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 After reviewing the pathology in detail, we proceeded to discuss the different treatment options between surgery, radiation, chemotherapy, antiestrogen therapies.  Recommendations: 1. Breast conserving surgery followed by 2. Oncotype DX testing to determine if chemotherapy would be of any benefit followed by 3. Adjuvant radiation therapy followed by 4. Adjuvant antiestrogen therapy  Oncotype counseling: I discussed Oncotype DX test. I explained to the patient that this is a 21 gene panel to evaluate patient tumors DNA to calculate recurrence score. This would help determine whether patient has high risk or intermediate risk or low risk breast cancer. She understands that if her tumor was found to be high risk, she would benefit from systemic chemotherapy. If low risk, no need of chemotherapy. If she was found to be intermediate risk, we would need to evaluate the score as well as other risk factors and determine if an abbreviated chemotherapy may be of benefit.  Return to clinic after surgery to discuss final pathology report and then determine if Oncotype DX testing will need to be sent.

## 2016-10-27 NOTE — Progress Notes (Signed)
Radiation Oncology         [phone] ________________________________  Name: Lisa Anthony MRN: [id number]  Date: 10/27/2016  DOB: 1966-09-12  CC:No PCP Per Patient  Jovita Kussmaul, MD     REFERRING PHYSICIAN: Autumn Messing III, MD   DIAGNOSIS: The encounter diagnosis was Malignant neoplasm of upper-inner quadrant of right breast in female, estrogen receptor positive ([place]).   HISTORY OF PRESENT ILLNESS: Lisa Anthony is a 51 y.o. female with a newly diagnosed breast cancer seen today in multidisciplinary clinic today.  She was noted to have some asymmetry and calcifications on recent screening mammogram.  Further evalution with diagnostic mammogram revealed a 1.1x0.8x1cm mass in the right breast with a borderline node.  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  Axillary lymph node biopsy performed on 10/25/16 revealed a benign reactive lymph node without evidence of malignancy. She presents today for further discussion and consideration of her treatment options.   PREVIOUS RADIATION THERAPY: No   PAST MEDICAL HISTORY:  Past Medical History:  Diagnosis Date  . Breast cancer ([place])        PAST SURGICAL HISTORY: Past Surgical History:  Procedure Laterality Date  . BREAST BIOPSY       FAMILY HISTORY:  Family History  Problem Relation Age of Onset  . Breast cancer Maternal Grandmother     60-70s     SOCIAL HISTORY:  reports that she has never smoked. She does not have any smokeless tobacco history on file. She reports that she drinks alcohol. She reports that she does not use drugs. She is divorced and lives in [place], [place] with her 3 children.  She works in the Conservator, museum/gallery at [employer].    ALLERGIES: Patient has no known allergies.   MEDICATIONS:  Current Outpatient Prescriptions  Medication Sig Dispense Refill  . HYDROmorphone (DILAUDID) 2 MG tablet Take 1-2  tablets (2-4 mg total) by mouth every 6 (six) hours as needed for severe pain. 20 tablet 0  . naproxen (NAPROSYN) 500 MG tablet Take 1 tablet (500 mg total) by mouth 2 (two) times daily. 30 tablet 0  . ondansetron (ZOFRAN ODT) 4 MG disintegrating tablet Take 1-2 tablets (4-8 mg total) by mouth every 8 (eight) hours as needed for nausea or vomiting. 20 tablet 0  . oxyCODONE-acetaminophen (PERCOCET/ROXICET) 5-325 MG tablet Take 1-2 tablets by mouth every 6 (six) hours as needed for severe pain. 14 tablet 0  . predniSONE (STERAPRED UNI-PAK 21 TAB) 10 MG (21) TBPK tablet Take 1 tablet (10 mg total) by mouth daily. Take as directed 21 tablet 0   No current facility-administered medications for this encounter.      REVIEW OF SYSTEMS: On review of systems, the patient reports that she is doing well overall. She denies any chest pain, shortness of breath, cough, fevers, chills, night sweats, unintended weight changes. She denies any bowel or bladder disturbances, and denies abdominal pain, nausea or vomiting. She denies any new musculoskeletal or joint aches or pains. A complete review of systems is obtained and is otherwise negative.    PHYSICAL EXAM:  Wt Readings from Last 3 Encounters:  10/27/16 146 lb 4.8 oz (66.4 kg)  12/11/15 135 lb (61.2 kg)   Temp Readings from Last 3 Encounters:  10/27/16 98.2 F (36.8 C) (Oral)  12/13/15 98.2 F (36.8 C) (Oral)  12/11/15 97.9 F (36.6 C) (Oral)   BP Readings  from Last 3 Encounters:  10/27/16 (!) 143/82  12/13/15 105/68  12/11/15 118/85   Pulse Readings from Last 3 Encounters:  10/27/16 94  12/13/15 64  12/11/15 86    In general this is a well appearing Caucasian female in no acute distress. She is alert and oriented x4 and appropriate throughout the examination. HEENT reveals that the patient is normocephalic, atraumatic. EOMs are intact. PERRLA. Skin is intact without any evidence of gross lesions. Cardiovascular exam reveals a regular rate  and rhythm, no clicks rubs or murmurs are auscultated. Chest is clear to auscultation bilaterally. Bilateral breast exam is performed.  Ecchymosis and induration is noted at the biospy site on the right breast.  The left breast exam is normal without palpable mass.  No nipple discharge bilaterally.  Lymphatic assessment is performed and does not reveal any adenopathy in the cervical, supraclavicular, axillary, or inguinal chains. Abdomen has active bowel sounds in all quadrants and is intact. The abdomen is soft, non tender, non distended. Lower extremities are negative for pretibial pitting edema, deep calf tenderness, cyanosis or clubbing.   ECOG = 0  0 - Asymptomatic (Fully active, able to carry on all predisease activities without restriction)  1 - Symptomatic but completely ambulatory (Restricted in physically strenuous activity but ambulatory and able to carry out work of a light or sedentary nature. For example, light housework, office work)  2 - Symptomatic, <50% in bed during the day (Ambulatory and capable of all self care but unable to carry out any work activities. Up and about more than 50% of waking hours)  3 - Symptomatic, >50% in bed, but not bedbound (Capable of only limited self-care, confined to bed or chair 50% or more of waking hours)  4 - Bedbound (Completely disabled. Cannot carry on any self-care. Totally confined to bed or chair)  5 - Death   Eustace Pen MM, Creech RH, Tormey DC, et al. [phone]). "Toxicity and response criteria of the [facility]". [place] Oncol. 5 (6): 649-55    LABORATORY DATA:  Lab Results  Component Value Date   WBC 8.4 10/27/2016   HGB 13.2 10/27/2016   HCT 38.7 10/27/2016   MCV 94.6 10/27/2016   PLT 304 10/27/2016   Lab Results  Component Value Date   NA 137 10/27/2016   K 3.9 10/27/2016   CL 103 12/13/2015   CO2 25 10/27/2016   Lab Results  Component Value Date   ALT 22 10/27/2016   AST 22 10/27/2016    ALKPHOS 64 10/27/2016   BILITOT 0.77 10/27/2016      RADIOGRAPHY: Mm Digital Screening Bilateral  Result Date: 10/14/2016 CLINICAL DATA:  Screening. EXAM: DIGITAL SCREENING BILATERAL MAMMOGRAM WITH CAD COMPARISON:  None available. ACR Breast Density Category c: The breast tissue is heterogeneously dense, which may obscure small masses. FINDINGS: In the right breast, a possible asymmetry with associated microcalcifications warrants further evaluation. In the left breast, no findings suspicious for malignancy. Images were processed with CAD. IMPRESSION: Further evaluation is suggested for possible asymmetry with associated microcalcifications in the right breast. RECOMMENDATION: Diagnostic mammogram and possibly ultrasound of the right breast. (Code:[id number]) The patient will be contacted regarding the findings, and additional imaging will be scheduled. BI-RADS CATEGORY  0: Incomplete. Need additional imaging evaluation and/or prior mammograms for comparison. Electronically Signed   By: Fidela Salisbury M.D.   On: 10/15/2016 10:07   US Breast Ltd Uni Right Inc Axilla  Result Date: 10/20/2016 CLINICAL DATA:  Screening  recall for a right breast asymmetry and calcifications. EXAM: 2D DIGITAL DIAGNOSTIC RIGHT MAMMOGRAM WITH ADJUNCT TOMO ULTRASOUND RIGHT BREAST COMPARISON:  Previous exam(s). ACR Breast Density Category c: The breast tissue is heterogeneously dense, which may obscure small masses. FINDINGS: In the superior right breast there is a a persistent obscured mass in the posterior depth measuring approximately 1.0 cm. There are several calcifications associated with the mass which layer on the true lateral view and are consistent with benign milk of calcium. On physical exam, a small palpable mass is identified in the upper slightly outer quadrant of the right breast. Targeted ultrasound is performed, showing an irregular complex mass with cystic components in the right breast at 1230 o'clock, 4 cm  from the nipple, measuring 1.1 x 0.8 x 1.0 cm. Ultrasound of the right axilla demonstrates multiple normal-appearing lymph nodes with preserved fatty hila. There is 1 mildly prominent right axillary lymph node with cortex measuring up to 4 mm. IMPRESSION: 1. There is an indeterminate complex mass in the right breast at 12:30. 2. There is 1 borderline right axillary lymph node with cortex measuring up to 4 mm. The morphology of the lymph node appears normal. 3. The calcifications in the right breast are consistent with benign milk of calcium. RECOMMENDATION: 1. Ultrasound-guided biopsy is recommended for the complex right breast mass at 12:30. This has been added on to today's interventional schedule. 2. If the pathology results for the biopsied mass are benign, a six-month follow-up right axillary ultrasound is recommended for the prominent lymph node. If pathology results are malignant, consider ultrasound-guided biopsy for the right axillary lymph node. I have discussed the findings and recommendations with the patient. Results were also provided in writing at the conclusion of the visit. If applicable, a reminder letter will be sent to the patient regarding the next appointment. BI-RADS CATEGORY  4: Suspicious. Electronically Signed   By: Ammie Ferrier M.D.   On: 10/20/2016 10:56   Mm Diag Breast Tomo Uni Right  Result Date: 10/20/2016 CLINICAL DATA:  Screening recall for a right breast asymmetry and calcifications. EXAM: 2D DIGITAL DIAGNOSTIC RIGHT MAMMOGRAM WITH ADJUNCT TOMO ULTRASOUND RIGHT BREAST COMPARISON:  Previous exam(s). ACR Breast Density Category c: The breast tissue is heterogeneously dense, which may obscure small masses. FINDINGS: In the superior right breast there is a a persistent obscured mass in the posterior depth measuring approximately 1.0 cm. There are several calcifications associated with the mass which layer on the true lateral view and are consistent with benign milk of calcium.  On physical exam, a small palpable mass is identified in the upper slightly outer quadrant of the right breast. Targeted ultrasound is performed, showing an irregular complex mass with cystic components in the right breast at 1230 o'clock, 4 cm from the nipple, measuring 1.1 x 0.8 x 1.0 cm. Ultrasound of the right axilla demonstrates multiple normal-appearing lymph nodes with preserved fatty hila. There is 1 mildly prominent right axillary lymph node with cortex measuring up to 4 mm. IMPRESSION: 1. There is an indeterminate complex mass in the right breast at 12:30. 2. There is 1 borderline right axillary lymph node with cortex measuring up to 4 mm. The morphology of the lymph node appears normal. 3. The calcifications in the right breast are consistent with benign milk of calcium. RECOMMENDATION: 1. Ultrasound-guided biopsy is recommended for the complex right breast mass at 12:30. This has been added on to today's interventional schedule. 2. If the pathology results for the biopsied mass are  benign, a six-month follow-up right axillary ultrasound is recommended for the prominent lymph node. If pathology results are malignant, consider ultrasound-guided biopsy for the right axillary lymph node. I have discussed the findings and recommendations with the patient. Results were also provided in writing at the conclusion of the visit. If applicable, a reminder letter will be sent to the patient regarding the next appointment. BI-RADS CATEGORY  4: Suspicious. Electronically Signed   By: Ammie Ferrier M.D.   On: 10/20/2016 10:56   Mm Clip Placement Right  Result Date: 10/25/2016 CLINICAL DATA:  51 year old female status post ultrasound-guided biopsy of a right axillary lymph nodes EXAM: DIAGNOSTIC RIGHT MAMMOGRAM POST ULTRASOUND BIOPSY COMPARISON:  Previous exam(s). FINDINGS: Mammographic images were obtained following ultrasound guided biopsy of an indeterminate right axillary lymph node. Cc and lateral views of  the right breast were performed. Due to its posterior, superior location, the marker within the biopsied right axillary lymph node was unable to be visualized. Biopsy marker deployment was confirmed under real-time ultrasound. IMPRESSION: The marker placed within the biopsied right axillary lymph node was unable to be visualized mammographically due to its posterior, superior location. Final Assessment: Post Procedure Mammograms for Marker Placement Electronically Signed   By: Pamelia Hoit M.D.   On: 10/25/2016 09:29   Mm Clip Placement Right  Result Date: 10/20/2016 CLINICAL DATA:  Status post ultrasound-guided core needle biopsy of a right breast mass. EXAM: DIAGNOSTIC RIGHT MAMMOGRAM POST ULTRASOUND BIOPSY COMPARISON:  Previous exam(s). FINDINGS: Mammographic images were obtained following ultrasound guided biopsy of a right breast mass. The ribbon shaped biopsy clip lies in the expected location of the mass. IMPRESSION: Well-positioned ribbon shaped biopsy clip following ultrasound-guided core needle biopsy of a right breast mass. Final Assessment: Post Procedure Mammograms for Marker Placement Electronically Signed   By: Lajean Manes M.D.   On: 10/20/2016 10:46   [place] Rt Breast Bx W Loc Dev 1st Lesion Img Bx Spec US Guide  Addendum Date: 10/27/2016   ADDENDUM REPORT: 10/26/2016 10:06 ADDENDUM: Pathology revealed a benign reactive lymph node in the right axilla. This was found to be concordant by Dr. Pamelia Hoit. Pathology results were discussed with the patient by telephone. The patient reported doing well after the biopsy. Post biopsy instructions and care were reviewed and questions were answered. The patient was encouraged to call [facility] for any additional concerns. The patient has recently been diagnosed with right breast cancer and has an appointment at [facility] on October 27, 2016. Pathology results reported by Susa Raring RN,  BSN on 10/26/2016. Electronically Signed   By: Pamelia Hoit M.D.   On: 10/26/2016 10:06   Result Date: 10/27/2016 CLINICAL DATA:  51 year old female with recent ultrasound-guided right breast biopsy demonstrating invasive ductal carcinoma. The patient returns for ultrasound-guided biopsy of an indeterminate right axillary lymph node. EXAM: ULTRASOUND GUIDED CORE NEEDLE BIOPSY OF A RIGHT AXILLARY NODE COMPARISON:  Previous exam(s). FINDINGS: I met with the patient and we discussed the procedure of ultrasound-guided biopsy, including benefits and alternatives. We discussed the high likelihood of a successful procedure. We discussed the risks of the procedure, including infection, bleeding, tissue injury, clip migration, and inadequate sampling. Informed written consent was given. The usual time-out protocol was performed immediately prior to the procedure. Using sterile technique and 1% Lidocaine as local anesthetic, under direct ultrasound visualization, a 14 gauge spring-loaded device was used to perform biopsy of an indeterminate right axillary lymph node  using a lateral to medial approach. At the conclusion of the procedure a HydroMARK spiral shaped tissue marker clip was deployed into the biopsy cavity. Follow up 2 view mammogram was performed and dictated separately. IMPRESSION: Ultrasound guided biopsy of an indeterminate right axillary lymph node. No apparent complications. Electronically Signed: By: Pamelia Hoit M.D. On: 10/25/2016 09:18   [place] Rt Breast Bx W Loc Dev 1st Lesion Img Bx Spec US Guide  Addendum Date: 10/21/2016   ADDENDUM REPORT: 10/21/2016 11:52 ADDENDUM: Pathology revealed grade III invasive ductal carcinoma in the right breast. This was found to be concordant by Dr. Lajean Manes. Pathology results were discussed with the patient by telephone. The patient reported doing well after the biopsy. Post biopsy instructions and care were reviewed and questions were answered. The patient was encouraged to  call [facility] for any additional concerns. The patient was referred to the [facility] on October 27, 2016. The patient is scheduled to return to [facility] for a right axillary lymph node biopsy on Monday, October 25, 2016. Pathology results reported by Susa Raring RN, BSN on 10/21/2016. Electronically Signed   By: Lajean Manes M.D.   On: 10/21/2016 11:52   Result Date: 10/20/2016 CLINICAL DATA:  Patient presents for ultrasound-guided core needle biopsy of a 12:30 o'clock right breast mass. EXAM: ULTRASOUND GUIDED RIGHT BREAST CORE NEEDLE BIOPSY COMPARISON:  Previous exam(s). FINDINGS: I met with the patient and we discussed the procedure of ultrasound-guided biopsy, including benefits and alternatives. We discussed the high likelihood of a successful procedure. We discussed the risks of the procedure, including infection, bleeding, tissue injury, clip migration, and inadequate sampling. Informed written consent was given. The usual time-out protocol was performed immediately prior to the procedure. Using sterile technique and 1% Lidocaine as local anesthetic, under direct ultrasound visualization, a 12 gauge spring-loaded device was used to perform biopsy of the upper inner quadrant right breast mass using an inferior, medial approach. At the conclusion of the procedure a ribbon shaped tissue marker clip was deployed into the biopsy cavity. Follow up 2 view mammogram was performed and dictated separately. IMPRESSION: Ultrasound guided biopsy of a right breast mass. No apparent complications. Electronically Signed: By: Lajean Manes M.D. On: 10/20/2016 09:58       IMPRESSION/PLAN: 1. 51 y/o female with Stage 1A, cT1c N0 Mx grade 3 invasive ductal carcinoma of the right breast, ER/PR positive, HER2 not amplified with Pi67 of 30%.  Dr. Lisbeth Renshaw discusses the pathology findings and reviews  the nature of invasive breast disease. The consensus from the breast conference include breast conservation with lumpectomy and sentinel lymph node mapping. Oncotype testing will be performed provided that the final measurment on surgical specimen is 21m or greater, to assess the potential role of chemotherapy in her treatment plan.  Provided that chemotherapy is not indicated, the patient's course would then be followed by external radiotherapy to the breast x 6 1/2 weeks followed by antiestrogen therapy. We discussed the risks, benefits, short, and long term effects of radiotherapy, and the patient is interested in proceeding. Dr. MLisbeth Renshawdiscusses the delivery and logistics of radiotherapy. We will see her back about 2 weeks after surgery to move forward with the simulation and planning process and anticipate starting radiotherapy about 4 weeks after surgery.    The above documentation reflects my direct findings during this shared patient visit. Please see the separate  note by Dr. Lisbeth Renshaw on this date for the remainder of the patient's plan of care.    Carola Rhine, PAC

## 2016-10-27 NOTE — Progress Notes (Signed)
Nutrition Assessment  Reason for Assessment:  Pt seen in Breast Clinic  ASSESSMENT:   51 year old female with new diagnosis of breast cancer.  Past medical history reviewed  Patient reports normal appetite.  Medications:  reviewed  Labs: reviewed  Anthropometrics:   Height: 66 inches Weight: 146 pounds BMI: 23.7   NUTRITION DIAGNOSIS: Food and nutrition related knowledge deficit related to new diagnosis of breast cancer as evidenced by no prior need for nutrition related information.  INTERVENTION:   Discussed and provided packet of information regarding nutritional tips for breast cancer patients.  Questions answered.  Teachback method used.      MONITORING, EVALUATION, and GOAL: Pt will consume a healthy plant based diet to maintain lean body mass throughout treatment.   Lisa Anthony, Lisa Anthony, Lisa Anthony (pager)

## 2016-10-27 NOTE — Patient Instructions (Signed)

## 2016-10-27 NOTE — Progress Notes (Signed)
[place] CONSULT NOTE  Patient Care Team: No Pcp Per Patient as PCP - General (General Practice) Lovell Sheehan, NP as Nurse Practitioner (Nurse Practitioner) Autumn Messing III, MD as Consulting Physician (General Surgery) Nicholas Lose, MD as Consulting Physician (Hematology and Oncology) Kyung Rudd, MD as Consulting Physician (Radiation Oncology)  CHIEF COMPLAINTS/PURPOSE OF CONSULTATION:  Newly diagnosed breast cancer  HISTORY OF PRESENTING ILLNESS:  Lisa Anthony 51 y.o. female is here because of recent diagnosis of right breast cancer. Patient had a routine screening mammogram the detected a right breast abnormality. This was evaluated by ultrasound and right breast biopsy was performed. She was presented this morning at the multidisciplinary tumor board and she is here today to discuss the treatment plan. She does complain of discomfort related to the biopsy.    I reviewed her records extensively and collaborated the history with the patient.  SUMMARY OF ONCOLOGIC HISTORY:   Malignant neoplasm of upper-inner quadrant of right breast in female, estrogen receptor positive ([place])   10/20/2016 Initial Diagnosis    Right breast biopsy 12:30 position: IDC grade 3, ER 100%, PR 95%, Ki-67 30%, HER-2 negative ratio 1.31, screening detected right breast asymmetry and calcifications 1.1 cm, right axillary borderline enlarged lymph node biopsy benign, T1c N0 stage IA clinical stage      MEDICAL HISTORY:  Past Medical History:  Diagnosis Date  . Breast cancer [facility])     SURGICAL HISTORY: Past Surgical History:  Procedure Laterality Date  . BREAST BIOPSY    . HERNIA REPAIR      SOCIAL HISTORY: Social History   Social History  . Marital status: Divorced    Spouse name: N/A  . Number of children: N/A  . Years of education: N/A   Occupational History  . Not on file.   Social History Main Topics  . Smoking status: Former Smoker    Quit date: 03/26/1998  . Smokeless  tobacco: Not on file  . Alcohol use Yes     Comment: 5-6  . Drug use: No  . Sexual activity: Not on file   Other Topics Concern  . Not on file   Social History Narrative  . No narrative on file    FAMILY HISTORY: Family History  Problem Relation Age of Onset  . Breast cancer Maternal Grandmother     60-70s    ALLERGIES:  has No Known Allergies.  MEDICATIONS:  Current Outpatient Prescriptions  Medication Sig Dispense Refill  . HYDROmorphone (DILAUDID) 2 MG tablet Take 1-2 tablets (2-4 mg total) by mouth every 6 (six) hours as needed for severe pain. 20 tablet 0  . naproxen (NAPROSYN) 500 MG tablet Take 1 tablet (500 mg total) by mouth 2 (two) times daily. 30 tablet 0  . ondansetron (ZOFRAN ODT) 4 MG disintegrating tablet Take 1-2 tablets (4-8 mg total) by mouth every 8 (eight) hours as needed for nausea or vomiting. 20 tablet 0  . oxyCODONE-acetaminophen (PERCOCET/ROXICET) 5-325 MG tablet Take 1-2 tablets by mouth every 6 (six) hours as needed for severe pain. 14 tablet 0  . predniSONE (STERAPRED UNI-PAK 21 TAB) 10 MG (21) TBPK tablet Take 1 tablet (10 mg total) by mouth daily. Take as directed 21 tablet 0   No current facility-administered medications for this visit.     REVIEW OF SYSTEMS:   Constitutional: Denies fevers, chills or abnormal night sweats Eyes: Denies blurriness of vision, double vision or watery eyes Ears, nose, mouth, throat, and face: Denies mucositis or sore  throat Respiratory: Denies cough, dyspnea or wheezes Cardiovascular: Denies palpitation, chest discomfort or lower extremity swelling Gastrointestinal:  Denies nausea, heartburn or change in bowel habits Skin: Denies abnormal skin rashes Lymphatics: Denies new lymphadenopathy or easy bruising Neurological:Denies numbness, tingling or new weaknesses Behavioral/Psych: Mood is stable, no new changes  Breast:  Denies any palpable lumps or discharge All other systems were reviewed with the patient and  are negative.  PHYSICAL EXAMINATION: ECOG PERFORMANCE STATUS: 1 - Symptomatic but completely ambulatory  Vitals:   10/27/16 1246  BP: (!) 143/82  Pulse: 94  Resp: 18  Temp: 98.2 F (36.8 C)   Filed Weights   10/27/16 1246  Weight: 146 lb 4.8 oz (66.4 kg)    GENERAL:alert, no distress and comfortable SKIN: skin color, texture, turgor are normal, no rashes or significant lesions EYES: normal, conjunctiva are pink and non-injected, sclera clear OROPHARYNX:no exudate, no erythema and lips, buccal mucosa, and tongue normal  NECK: supple, thyroid normal size, non-tender, without nodularity LYMPH:  no palpable lymphadenopathy in the cervical, axillary or inguinal LUNGS: clear to auscultation and percussion with normal breathing effort HEART: regular rate & rhythm and no murmurs and no lower extremity edema ABDOMEN:abdomen soft, non-tender and normal bowel sounds Musculoskeletal:no cyanosis of digits and no clubbing  PSYCH: alert & oriented x 3 with fluent speech NEURO: no focal motor/sensory deficits BREAST: No palpable nodules in breast. No palpable axillary or supraclavicular lymphadenopathy (exam performed in the presence of a chaperone)   LABORATORY DATA:  I have reviewed the data as listed Lab Results  Component Value Date   WBC 8.4 10/27/2016   HGB 13.2 10/27/2016   HCT 38.7 10/27/2016   MCV 94.6 10/27/2016   PLT 304 10/27/2016   Lab Results  Component Value Date   NA 137 10/27/2016   K 3.9 10/27/2016   CL 103 12/13/2015   CO2 25 10/27/2016    RADIOGRAPHIC STUDIES: I have personally reviewed the radiological reports and agreed with the findings in the report.  ASSESSMENT AND PLAN:  Malignant neoplasm of upper-inner quadrant of right breast in female, estrogen receptor positive ([place]) 10/20/2016: Right breast biopsy 12:30 position: IDC grade 3, ER 100%, PR 95%, Ki-67 30%, HER-2 negative ratio 1.31, screening detected right breast asymmetry and calcifications 1.1  cm, right axillary borderline enlarged lymph node biopsy benign, T1c N0 stage IA clinical stage  Pathology and radiology counseling:Discussed with the patient, the details of pathology including the type of breast cancer,the clinical staging, the significance of ER, PR and HER-2/neu receptors and the implications for treatment. After reviewing the pathology in detail, we proceeded to discuss the different treatment options between surgery, radiation, chemotherapy, antiestrogen therapies.  Recommendations: 1. Breast conserving surgery followed by 2. Oncotype DX testing to determine if chemotherapy would be of any benefit followed by 3. Adjuvant radiation therapy followed by 4. Adjuvant antiestrogen therapy  Oncotype counseling: I discussed Oncotype DX test. I explained to the patient that this is a 21 gene panel to evaluate patient tumors DNA to calculate recurrence score. This would help determine whether patient has high risk or intermediate risk or low risk breast cancer. She understands that if her tumor was found to be high risk, she would benefit from systemic chemotherapy. If low risk, no need of chemotherapy. If she was found to be intermediate risk, we would need to evaluate the score as well as other risk factors and determine if an abbreviated chemotherapy may be of benefit.  Return to clinic after  surgery to discuss final pathology report and then determine if Oncotype DX testing will need to be sent.     All questions were answered. The patient knows to call the clinic with any problems, questions or concerns.    Rulon Eisenmenger, MD 10/27/16

## 2016-10-27 NOTE — Therapy (Signed)
[place], [place], [address] Phone: [phone]   Fax:  [phone]  Physical Therapy Evaluation  Patient Details  Name: Lisa Anthony MRN: [id number] Date of Birth: [birth year] Referring Provider: Dr. Autumn Messing  Encounter Date: 10/27/2016      PT End of Session - 10/27/16 1422    Visit Number 1   Number of Visits 1   PT Start Time 1340   PT Stop Time 1404   PT Time Calculation (min) 24 min   Activity Tolerance Patient tolerated treatment well   Behavior During Therapy [facility] for tasks assessed/performed      Past Medical History:  Diagnosis Date  . Breast cancer [facility])     Past Surgical History:  Procedure Laterality Date  . BREAST BIOPSY    . HERNIA REPAIR      There were no vitals filed for this visit.       Subjective Assessment - 10/27/16 1422    Subjective Patient reports she is here today to be seen by her medical team for her newly diagnosed right breast cancer.   Patient is accompained by: Family member   Pertinent History Patient was diagnosed on 10/14/16 with right grade 3 invasive ductal carcinoma breast cancer.  It is ER/PR positive and HER2 negative with a ki67 of 30%. It measures 1.1 cm and is located in the upper inner quadrant.   Patient Stated Goals Reduce lymphedema risk and learn post op shoulder ROM HEP            [facility] PT Assessment - 10/27/16 0001      Assessment   Medical Diagnosis Right breast cancer   Referring Provider Dr. Autumn Messing   Onset Date/Surgical Date 10/14/16   Hand Dominance Right   Prior Therapy none     Precautions   Precautions Other (comment)   Precaution Comments active cancer     Restrictions   Weight Bearing Restrictions No     Balance Screen   Has the patient fallen in the past 6 months No   Has the patient had a decrease in activity level because of a fear of falling?  No   Is the patient reluctant to leave their home  because of a fear of falling?  No     Home Environment   Living Environment Private residence   Living Arrangements Children  16 and 65 y.o. kids   Available Help at Discharge Family     Prior Function   Level of Independence Independent   Vocation Full time employment   Vocation Requirements IT at [employer] She does not exercise     Cognition   Overall Cognitive Status Within Functional Limits for tasks assessed     Posture/Postural Control   Posture/Postural Control Postural limitations   Postural Limitations Forward head;Rounded Shoulders     ROM / Strength   AROM / PROM / Strength AROM;Strength     AROM   AROM Assessment Site Shoulder;Cervical   Right/Left Shoulder Left;Right   Right Shoulder Extension 39 Degrees   Right Shoulder Flexion 150 Degrees   Right Shoulder ABduction 168 Degrees   Right Shoulder Internal Rotation 79 Degrees   Right Shoulder External Rotation 90 Degrees   Left Shoulder Extension 54 Degrees   Left Shoulder Flexion 158 Degrees   Left Shoulder ABduction 172 Degrees   Left Shoulder Internal Rotation 79 Degrees   Left Shoulder External Rotation 90 Degrees  Cervical Flexion WNL   Cervical Extension WNL   Cervical - Right Side Bend WNL   Cervical - Left Side Bend WNL   Cervical - Right Rotation WNL   Cervical - Left Rotation WNL     Strength   Overall Strength Within functional limits for tasks performed           LYMPHEDEMA/ONCOLOGY QUESTIONNAIRE - 10/27/16 1417      Type   Cancer Type Right breast cancer     Lymphedema Assessments   Lymphedema Assessments Upper extremities     Right Upper Extremity Lymphedema   10 cm Proximal to Olecranon Process 26.4 cm   Olecranon Process 23.5 cm   10 cm Proximal to Ulnar Styloid Process 20.8 cm   Just Proximal to Ulnar Styloid Process 15.3 cm   Across Hand at [employer] 18.5 cm   At [place] of 2nd Digit 6 cm     Left Upper Extremity Lymphedema   10 cm Proximal to Olecranon  Process 26.3 cm   Olecranon Process 23.6 cm   10 cm Proximal to Ulnar Styloid Process 20.2 cm   Just Proximal to Ulnar Styloid Process 15.1 cm   Across Hand at [employer] 18.3 cm   At [place] of 2nd Digit 6.1 cm            Patient was instructed today in a home exercise program today for post op shoulder range of motion. These included active assist shoulder flexion in sitting, scapular retraction, wall walking with shoulder abduction, and hands behind head external rotation.  She was encouraged to do these twice a day, holding 3 seconds and repeating 5 times when permitted by her physician.        PT Education - 10/27/16 1421    Education provided Yes   Education Details Lymphedema risk reduction and post op shoulder ROM HEP   Person(s) Educated Patient  ex-husband   Methods Explanation;Demonstration;Handout   Comprehension Verbalized understanding;Returned demonstration              Breast Clinic Goals - 10/27/16 1425      Patient will be able to verbalize understanding of pertinent lymphedema risk reduction practices relevant to her diagnosis specifically related to skin care.   Time 1   Period Days   Status Achieved     Patient will be able to return demonstrate and/or verbalize understanding of the post-op home exercise program related to regaining shoulder range of motion.   Time 1   Period Days   Status Achieved     Patient will be able to verbalize understanding of the importance of attending the postoperative After Breast Cancer Class for further lymphedema risk reduction education and therapeutic exercise.   Time 1   Period Days   Status Achieved              Plan - 10/27/16 1422    Clinical Impression Statement Patient was diagnosed on 10/14/16 with right grade 3 invasive ductal carcinoma breast cancer.  It is ER/PR positive and HER2 negative with a ki67 of 30%. It measures 1.1 cm and is located in the upper inner quadrant. Her  multidisciplinary medical team met prior to her assessments to determine a recommended treatment plan.  She is planning to have a right lumpectomy and sentinel node biopsy followed by Oncotype testing, radiation and anti-estrogen therapy.  She may benefit from post op PT to regain shoulder ROM and reduce lymphedema risk.  Due to her  lack of comorbidities, her eval is of low complexity.   Rehab Potential Excellent   Clinical Impairments Affecting Rehab Potential none   PT Frequency One time visit   PT Treatment/Interventions Therapeutic exercise;Patient/family education   PT Next Visit Plan Will f/u after surgery to determine PT needs   PT Home Exercise Plan Post op shoulder ROM HEP   Consulted and Agree with Plan of Care Patient;Family member/caregiver   Family Member Consulted ex-husband      Patient will benefit from skilled therapeutic intervention in order to improve the following deficits and impairments:  Postural dysfunction, Decreased knowledge of precautions, Pain, Impaired UE functional use, Decreased range of motion  Visit Diagnosis: Carcinoma of upper-inner quadrant of right breast in female, estrogen receptor positive ([place]) - Plan: PT plan of care cert/re-cert  Abnormal posture - Plan: PT plan of care cert/re-cert   Patient will follow up at outpatient cancer rehab if needed following surgery.  If the patient requires physical therapy at that time, a specific plan will be dictated and sent to the referring physician for approval. The patient was educated today on appropriate basic range of motion exercises to begin post operatively and the importance of attending the After Breast Cancer class following surgery.  Patient was educated today on lymphedema risk reduction practices as it pertains to recommendations that will benefit the patient immediately following surgery.  She verbalized good understanding.  No additional physical therapy is indicated at this time.      Problem  List Patient Active Problem List   Diagnosis Date Noted  . Malignant neoplasm of upper-inner quadrant of right breast in female, estrogen receptor positive ([place]) 10/27/2016    Annia Friendly, PT 10/27/16 2:27 PM  [place], [place], [address] Phone: [phone]   Fax:  [phone]  Name: Lisa Anthony MRN: [id number] Date of Birth: 07/05/1966

## 2016-10-28 ENCOUNTER — Encounter: Payer: Self-pay | Admitting: General Practice

## 2016-10-28 NOTE — Addendum Note (Signed)
Addended by: Jaci Carrel A on: 10/28/2016 10:38 AM   Modules accepted: Orders

## 2016-10-28 NOTE — Progress Notes (Signed)
[place] Psychosocial Distress Screening Spiritual Care  Reached Lisa Anthony by phone following Breast Multidisciplinary Clinic to introduce Lisa Anthony team/resources, reviewing distress screen per protocol.  The patient scored a 8 on the Psychosocial Distress Thermometer which indicates severe distress. Also assessed for distress and other psychosocial needs.   ONCBCN DISTRESS SCREENING 10/28/2016  Screening Type Initial Screening  Distress experienced in past week (1-10) 8  Practical problem type Insurance;Work/school;Food;Childcare  Family Problem type Children  Emotional problem type Nervousness/Anxiety;Adjusting to illness;Adjusting to appearance changes  Physical Problem type Sleep/insomnia  Referral to support programs Yes   Offered additional emotional support and opportunity for Lisa Anthony to share and process her story/feelings about dx/tx.  Per pt, [facility] was helpful in reducing her anxiety by increasing information and developing care plan.  Per pt, she told her children (18y, 37y, 13y) about dx last night, which also reduced some distress.  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    Follow up needed: No. Lisa Anthony welcomes referral for [employer], which I am placing now.  She is also interested in massage, support group, and, as she gets her bearings, exploring other programs.  She plans to contact Support Team as needs/desires become more clear and has full packet of [place] resources.    [place], [place], [facility] Pager [phone] Voicemail [phone]

## 2016-10-29 ENCOUNTER — Telehealth: Payer: Self-pay | Admitting: Hematology and Oncology

## 2016-10-29 NOTE — Telephone Encounter (Signed)
lvm to inform pt of 2/20 appt date/time per LOS

## 2016-11-01 ENCOUNTER — Telehealth: Payer: Self-pay | Admitting: *Deleted

## 2016-11-01 NOTE — Telephone Encounter (Signed)
  Oncology Nurse Navigator Documentation  Navigator Location: [facility] (11/01/16 1400)   )Navigator Encounter Type: Telephone (11/01/16 1400) Telephone: Outgoing Call;Clinic/[facility] Follow-up (11/01/16 1400)       Genetic Counseling Date: 11/09/16 (11/01/16 1400) Genetic Counseling Type: Non-Urgent (11/01/16 1400)                                        Time Spent with Patient: 15 (11/01/16 1400)

## 2016-11-02 DIAGNOSIS — D0511 Intraductal carcinoma in situ of right breast: Secondary | ICD-10-CM | POA: Insufficient documentation

## 2016-11-05 ENCOUNTER — Other Ambulatory Visit: Payer: Self-pay | Admitting: General Surgery

## 2016-11-05 DIAGNOSIS — C50411 Malignant neoplasm of upper-outer quadrant of right female breast: Secondary | ICD-10-CM

## 2016-11-05 DIAGNOSIS — Z170 Estrogen receptor positive status [ER+]: Principal | ICD-10-CM

## 2016-11-08 ENCOUNTER — Telehealth: Payer: Self-pay | Admitting: Hematology and Oncology

## 2016-11-08 NOTE — Telephone Encounter (Signed)
sw pt to confirm 3/8 appt at 215 pm per LOS

## 2016-11-09 ENCOUNTER — Other Ambulatory Visit: Payer: 59

## 2016-11-09 ENCOUNTER — Ambulatory Visit (HOSPITAL_BASED_OUTPATIENT_CLINIC_OR_DEPARTMENT_OTHER): Payer: 59

## 2016-11-09 DIAGNOSIS — Z7183 Encounter for nonprocreative genetic counseling: Secondary | ICD-10-CM | POA: Diagnosis not present

## 2016-11-09 DIAGNOSIS — C50411 Malignant neoplasm of upper-outer quadrant of right female breast: Secondary | ICD-10-CM

## 2016-11-09 DIAGNOSIS — Z170 Estrogen receptor positive status [ER+]: Principal | ICD-10-CM

## 2016-11-09 DIAGNOSIS — C50211 Malignant neoplasm of upper-inner quadrant of right female breast: Secondary | ICD-10-CM

## 2016-11-09 DIAGNOSIS — Z803 Family history of malignant neoplasm of breast: Secondary | ICD-10-CM

## 2016-11-09 DIAGNOSIS — C50919 Malignant neoplasm of unspecified site of unspecified female breast: Secondary | ICD-10-CM | POA: Diagnosis not present

## 2016-11-09 NOTE — Progress Notes (Signed)
REFERRING PROVIDER: Nicholas Lose, MD  PRIMARY PROVIDER:  No PCP Per Patient  PRIMARY REASON FOR VISIT:  1. Malignant neoplasm of upper-inner quadrant of right breast in female, estrogen receptor positive ([place])   2. Family history of malignant neoplasm of breast    HISTORY OF PRESENT ILLNESS:   Lisa Anthony, a 51 y.o. female, was seen for a [place] cancer genetics consultation at the request of Dr. Lindi Adie due to a personal and family history of cancer.  Lisa Anthony presents to clinic today to discuss the possibility of a hereditary predisposition to cancer, genetic testing, and to further clarify her future cancer risks, as well as potential cancer risks for family members.   In January 2018, at the age of 89, Lisa Anthony was diagnosed with invasive ductal carcinoma of the right breast. This will be treated with a lumpectomy scheduled on 11/18/16 followed by radiation. Chemotherapy will depend on her Oncotype Dx score. As the cancer is ER and PR positive, she will be put on anti-estrogen therapy afterwards.   CANCER HISTORY:    Malignant neoplasm of upper-inner quadrant of right breast in female, estrogen receptor positive ([place])   10/20/2016 Initial Diagnosis    Right breast biopsy 12:30 position: IDC grade 3, ER 100%, PR 95%, Ki-67 30%, HER-2 negative ratio 1.31, screening detected right breast asymmetry and calcifications 1.1 cm, right axillary borderline enlarged lymph node biopsy benign, T1c N0 stage IA clinical stage        HORMONAL RISK FACTORS:  Ovaries intact: yes.  Hysterectomy: no.  Colonoscopy: no; not examined. Mammogram within the last year: yes. Up to date with pelvic exams:  yes. Any excessive radiation exposure in the past:  no  Past Medical History:  Diagnosis Date  . Breast cancer [facility])     Past Surgical History:  Procedure Laterality Date  . BREAST BIOPSY    . HERNIA REPAIR      Social History   Social History  . Marital status: Divorced    Spouse  name: N/A  . Number of children: N/A  . Years of education: N/A   Social History Main Topics  . Smoking status: Former Smoker    Quit date: 03/26/1998  . Smokeless tobacco: Not on file  . Alcohol use Yes     Comment: 5-6  . Drug use: No  . Sexual activity: Not on file   Other Topics Concern  . Not on file   Social History Narrative  . No narrative on file     FAMILY HISTORY:  We obtained a detailed, 4-generation family history.  Significant diagnoses are listed below: Family History  Problem Relation Age of Onset  . Breast cancer Maternal Grandmother 21    bilateral  . Breast cancer Cousin 79    Lisa Anthony is unaware of previous family history of genetic testing for hereditary cancer risks. Patient's maternal ancestors are of [place] descent, and paternal ancestors are of [place] and [place] descent. There is no reported Ashkenazi Jewish ancestry. There is no known consanguinity.  GENETIC COUNSELING ASSESSMENT: Lisa Anthony is a 51 y.o. female with a personal and family history which is somewhat suggestive of a hereditary predisposition to cancer. We, therefore, discussed and recommended the following at today's visit.   DISCUSSION: We reviewed the characteristics, features and inheritance patterns of hereditary cancer syndromes. We also discussed genetic testing, including the appropriate family members to test, the process of testing, insurance coverage and turn-around-time for results. We discussed the implications  of a negative, positive and/or variant of uncertain significant result. We recommended Lisa Anthony pursue genetic testing for the Common Hereditary Cancer gene panel.   Based on Lisa Anthony's personal and family history of cancer, she meets medical criteria for genetic testing. Despite that she meets criteria, she may still have an out of pocket cost. We discussed that if her out of pocket cost for testing is over $100, the laboratory will call and confirm  whether she wants to proceed with testing.  If the out of pocket cost of testing is less than $100 she will be billed by the genetic testing laboratory.   PLAN: After considering the risks, benefits, and limitations, Lisa Anthony  provided informed consent to pursue genetic testing and the blood sample was sent to [facility] for analysis of the Common Hereditary Cancer gene panel. Results should be available within approximately 2-3 weeks' time, at which point they will be disclosed by telephone to Lisa Anthony, as will any additional recommendations warranted by these results. Lisa Anthony will receive a summary of her genetic counseling visit and a copy of her results once available. This information will also be available in Epic. We encouraged Lisa Anthony to remain in contact with cancer genetics annually so that we can continuously update the family history and inform her of any changes in cancer genetics and testing that may be of benefit for her family. Lisa Anthony questions were answered to her satisfaction today. Our contact information was provided should additional questions or concerns arise.  Lastly, we encouraged Lisa Anthony to remain in contact with cancer genetics annually so that we can continuously update the family history and inform her of any changes in cancer genetics and testing that may be of benefit for this family.   [name] questions were answered to her satisfaction today. Our contact information was provided should additional questions or concerns arise. Thank you for the referral and allowing [place] to share in the care of your patient.   Benay Pike, MS, [facility] Certified Genetic Counselor  The patient was seen for a total of 30 minutes in face-to-face genetic counseling.  This patient was discussed with Drs. Magrinat, Lindi Adie and/or Burr Anthony who agrees with the above.    _______________________________________________________________________ For Office  Staff:  Number of people involved in session: 2 Was an Intern/ student involved with case: no

## 2016-11-10 ENCOUNTER — Encounter (HOSPITAL_BASED_OUTPATIENT_CLINIC_OR_DEPARTMENT_OTHER): Payer: Self-pay | Admitting: *Deleted

## 2016-11-16 ENCOUNTER — Ambulatory Visit
Admission: RE | Admit: 2016-11-16 | Discharge: 2016-11-16 | Disposition: A | Discharge status: Home / Self Care | Payer: 59 | Source: Ambulatory Visit | Attending: General Surgery | Admitting: General Surgery

## 2016-11-16 DIAGNOSIS — C50411 Malignant neoplasm of upper-outer quadrant of right female breast: Secondary | ICD-10-CM

## 2016-11-16 DIAGNOSIS — C50911 Malignant neoplasm of unspecified site of right female breast: Secondary | ICD-10-CM | POA: Diagnosis not present

## 2016-11-16 DIAGNOSIS — Z170 Estrogen receptor positive status [ER+]: Principal | ICD-10-CM

## 2016-11-16 NOTE — Progress Notes (Signed)
Pt stopped in to pick up Boost drink today at 1420 hrs.  Instructions given/ pt verbalized understanding.

## 2016-11-17 ENCOUNTER — Telehealth: Payer: Self-pay | Admitting: Genetic Counselor

## 2016-11-17 ENCOUNTER — Ambulatory Visit: Payer: Self-pay | Admitting: Genetic Counselor

## 2016-11-17 DIAGNOSIS — C50211 Malignant neoplasm of upper-inner quadrant of right female breast: Secondary | ICD-10-CM

## 2016-11-17 DIAGNOSIS — Z1379 Encounter for other screening for genetic and chromosomal anomalies: Secondary | ICD-10-CM

## 2016-11-17 DIAGNOSIS — Z170 Estrogen receptor positive status [ER+]: Principal | ICD-10-CM

## 2016-11-17 DIAGNOSIS — Z803 Family history of malignant neoplasm of breast: Secondary | ICD-10-CM

## 2016-11-17 NOTE — Telephone Encounter (Signed)
Discussed Lisa Anthony's negative genetic testing result. Given these results and the later ages of diagnosis of breast cancer in herself and her relatives, as well as the number of female relatives without cancer, it is unlikely that her cancer is due to an inherited predisposition. Please see my encounter note for more details.

## 2016-11-17 NOTE — Progress Notes (Signed)
[facility]    Patient Name: Lisa Anthony Patient DOB: [birth year] Patient Age: 51 y.o. Encounter Date: 11/17/2016  Referring Provider: Nicholas Lose, MD  Primary Care Provider: No PCP Per Patient  [name] was called today to discuss genetic test results. Please see the Genetics note from her visit on 11/09/2016 for a detailed discussion of her personal and family history.  Genetic Testing: At the time of [name]'s visit, we recommended she pursue genetic testing of multiple genes associated with a hereditary predisposition to cancer. Testing included sequencing and deletion/duplication analysis of 43 genes on [facility] Common Cancers panel (APC, ATM, AXIN2, BARD1, BMPR1A, BRCA1, BRCA2, BRIP1, CDH1, CDKN2A, CHEK2, DICER1, EPCAM, GREM1, HOXB13, KIT, MEN1, MLH1, MSH2, MSH6, MUTYH, NBN, NF1, PALB2, PDGFRA, PMS2, POLD1, POLE, PTEN, RAD50, RAD51C, RAD51D, SDHA, SDHB, SDHC, SDHD, SMAD4, SMARCA4, STK11, TP53, TSC1, TSC2, VHL). Testing was normal and did not reveal a mutation in these genes. A copy of the genetic test report will be scanned into Epic under the media tab.  Since the current test is not perfect, it is possible there may be a gene mutation that current testing cannot detect, but that chance is small. We also discussed that it is possible that a different genetic factor, which was not part of this testing or has not yet been discovered, is responsible for the cancer diagnoses in the family. Again, the likelihood of this is low. No additional testing is recommended at this time.   Cancer Screening: This result suggests that [name]'s cancer was most likely not due to an inherited predisposition. Most cancers happen by chance and this negative test, along with details of her family history, suggests that her cancer falls into this category. We, therefore, recommended she continue to follow the cancer screening guidelines  provided by her physician.   Family Members: Family members are at some increased risk of developing cancer, over the general population risk, simply due to the family history. We recommended women have a yearly mammogram beginning at age 26, a yearly clinical breast exam, and perform monthly breast self-exams. A gynecologic exam is recommended yearly. Colon cancer screening is recommended to begin by age 21 for men and women.  Lastly, cancer genetics is a rapidly advancing field and it is possible that new genetic tests will be appropriate for her in the future. We encourage her to remain in contact with [place] on an annual basis so we can update her personal and family histories, and let her know of advances in cancer genetics that may benefit the family. Our contact number was provided. [name] is welcome to call anytime with additional questions.    Marylouise Stacks, MS, [facility] Certified Genetic Counselor phone: [phone] [email or web address]

## 2016-11-18 ENCOUNTER — Ambulatory Visit (HOSPITAL_BASED_OUTPATIENT_CLINIC_OR_DEPARTMENT_OTHER): Payer: 59 | Admitting: Anesthesiology

## 2016-11-18 ENCOUNTER — Ambulatory Visit
Admission: RE | Admit: 2016-11-18 | Discharge: 2016-11-18 | Disposition: A | Discharge status: Home / Self Care | Payer: 59 | Source: Ambulatory Visit | Attending: General Surgery | Admitting: General Surgery

## 2016-11-18 ENCOUNTER — Ambulatory Visit (HOSPITAL_BASED_OUTPATIENT_CLINIC_OR_DEPARTMENT_OTHER)
Admission: RE | Admit: 2016-11-18 | Discharge: 2016-11-18 | Disposition: A | Discharge status: Home / Self Care | Payer: 59 | Source: Ambulatory Visit | Attending: General Surgery | Admitting: General Surgery

## 2016-11-18 ENCOUNTER — Encounter (HOSPITAL_BASED_OUTPATIENT_CLINIC_OR_DEPARTMENT_OTHER): Payer: Self-pay

## 2016-11-18 ENCOUNTER — Encounter (HOSPITAL_BASED_OUTPATIENT_CLINIC_OR_DEPARTMENT_OTHER)
Admission: RE | Disposition: A | Discharge status: Home / Self Care | Payer: Self-pay | Source: Ambulatory Visit | Attending: General Surgery

## 2016-11-18 ENCOUNTER — Encounter (HOSPITAL_COMMUNITY)
Admission: RE | Admit: 2016-11-18 | Discharge: 2016-11-18 | Disposition: A | Discharge status: Home / Self Care | Payer: 59 | Source: Ambulatory Visit | Attending: General Surgery | Admitting: General Surgery

## 2016-11-18 DIAGNOSIS — Z170 Estrogen receptor positive status [ER+]: Principal | ICD-10-CM

## 2016-11-18 DIAGNOSIS — Z87891 Personal history of nicotine dependence: Secondary | ICD-10-CM | POA: Diagnosis not present

## 2016-11-18 DIAGNOSIS — G8918 Other acute postprocedural pain: Secondary | ICD-10-CM | POA: Diagnosis not present

## 2016-11-18 DIAGNOSIS — R928 Other abnormal and inconclusive findings on diagnostic imaging of breast: Secondary | ICD-10-CM | POA: Diagnosis not present

## 2016-11-18 DIAGNOSIS — C50411 Malignant neoplasm of upper-outer quadrant of right female breast: Secondary | ICD-10-CM | POA: Insufficient documentation

## 2016-11-18 DIAGNOSIS — E7800 Pure hypercholesterolemia, unspecified: Secondary | ICD-10-CM | POA: Insufficient documentation

## 2016-11-18 DIAGNOSIS — C50911 Malignant neoplasm of unspecified site of right female breast: Secondary | ICD-10-CM | POA: Diagnosis not present

## 2016-11-18 DIAGNOSIS — R590 Localized enlarged lymph nodes: Secondary | ICD-10-CM | POA: Diagnosis not present

## 2016-11-18 DIAGNOSIS — F419 Anxiety disorder, unspecified: Secondary | ICD-10-CM | POA: Insufficient documentation

## 2016-11-18 DIAGNOSIS — N6011 Diffuse cystic mastopathy of right breast: Secondary | ICD-10-CM | POA: Diagnosis not present

## 2016-11-18 DIAGNOSIS — Z803 Family history of malignant neoplasm of breast: Secondary | ICD-10-CM | POA: Diagnosis not present

## 2016-11-18 DIAGNOSIS — C50211 Malignant neoplasm of upper-inner quadrant of right female breast: Secondary | ICD-10-CM | POA: Diagnosis not present

## 2016-11-18 HISTORY — PX: BREAST LUMPECTOMY WITH RADIOACTIVE SEED AND SENTINEL LYMPH NODE BIOPSY: SHX6550

## 2016-11-18 HISTORY — PX: BREAST LUMPECTOMY: SHX2

## 2016-11-18 SURGERY — BREAST LUMPECTOMY WITH RADIOACTIVE SEED AND SENTINEL LYMPH NODE BIOPSY
Anesthesia: General | Site: Breast | Laterality: Right

## 2016-11-18 MED ORDER — BUPIVACAINE-EPINEPHRINE (PF) 0.5% -1:200000 IJ SOLN
INTRAMUSCULAR | Status: AC
  Filled 2016-11-18: qty 60

## 2016-11-18 MED ORDER — HYDROCODONE-ACETAMINOPHEN 5-325 MG PO TABS: 1.0000 | ORAL_TABLET | ORAL | 0 refills | Status: DC | PRN

## 2016-11-18 MED ORDER — GABAPENTIN 300 MG PO CAPS
ORAL_CAPSULE | ORAL | Status: AC
  Filled 2016-11-18: qty 1

## 2016-11-18 MED ORDER — FENTANYL CITRATE (PF) 100 MCG/2ML IJ SOLN
INTRAMUSCULAR | Status: AC
Start: 2016-11-18 — End: 2016-11-18
  Filled 2016-11-18: qty 2

## 2016-11-18 MED ORDER — OXYCODONE HCL 5 MG PO TABS
ORAL_TABLET | ORAL | Status: AC
  Filled 2016-11-18: qty 1

## 2016-11-18 MED ORDER — SODIUM CHLORIDE 0.9 % IJ SOLN
INTRAMUSCULAR | Status: AC
  Filled 2016-11-18: qty 10

## 2016-11-18 MED ORDER — FENTANYL CITRATE (PF) 100 MCG/2ML IJ SOLN
50.0000 ug | INTRAMUSCULAR | Status: AC | PRN
  Administered 2016-11-18: 100 ug via INTRAVENOUS
  Administered 2016-11-18: 50 ug via INTRAVENOUS
  Administered 2016-11-18: 100 ug via INTRAVENOUS

## 2016-11-18 MED ORDER — LIDOCAINE 2% (20 MG/ML) 5 ML SYRINGE
INTRAMUSCULAR | Status: DC | PRN
  Administered 2016-11-18: 100 mg via INTRAVENOUS
  Administered 2016-11-18: 200 mg via INTRAVENOUS
  Administered 2016-11-18: 50 mg via INTRAVENOUS

## 2016-11-18 MED ORDER — PROPOFOL 500 MG/50ML IV EMUL
INTRAVENOUS | Status: AC
  Filled 2016-11-18: qty 50

## 2016-11-18 MED ORDER — BUPIVACAINE-EPINEPHRINE 0.5% -1:200000 IJ SOLN
INTRAMUSCULAR | Status: DC | PRN
  Administered 2016-11-18: 24 mL

## 2016-11-18 MED ORDER — TECHNETIUM TC 99M SULFUR COLLOID FILTERED
1.0000 | Freq: Once | INTRAVENOUS | Status: AC | PRN
  Administered 2016-11-18: 1 via INTRADERMAL

## 2016-11-18 MED ORDER — ONDANSETRON HCL 4 MG/2ML IJ SOLN
INTRAMUSCULAR | Status: AC
  Filled 2016-11-18: qty 2

## 2016-11-18 MED ORDER — CHLORHEXIDINE GLUCONATE CLOTH 2 % EX PADS: 6.0000 | MEDICATED_PAD | Freq: Once | CUTANEOUS | Status: DC

## 2016-11-18 MED ORDER — ACETAMINOPHEN 500 MG PO TABS
1000.0000 mg | ORAL_TABLET | ORAL | Status: AC
  Administered 2016-11-18: 1000 mg via ORAL

## 2016-11-18 MED ORDER — HYDROMORPHONE HCL 1 MG/ML IJ SOLN: 0.2500 mg | INTRAMUSCULAR | Status: DC | PRN

## 2016-11-18 MED ORDER — GABAPENTIN 300 MG PO CAPS
300.0000 mg | ORAL_CAPSULE | ORAL | Status: AC
  Administered 2016-11-18: 300 mg via ORAL

## 2016-11-18 MED ORDER — LACTATED RINGERS IV SOLN
INTRAVENOUS | Status: DC
  Administered 2016-11-18: 13:00:00 via INTRAVENOUS

## 2016-11-18 MED ORDER — MIDAZOLAM HCL 2 MG/2ML IJ SOLN
INTRAMUSCULAR | Status: AC
  Filled 2016-11-18: qty 2

## 2016-11-18 MED ORDER — ONDANSETRON HCL 4 MG/2ML IJ SOLN: 4.0000 mg | Freq: Four times a day (QID) | INTRAMUSCULAR | Status: DC | PRN

## 2016-11-18 MED ORDER — OXYCODONE HCL 5 MG/5ML PO SOLN: 5.0000 mg | Freq: Once | ORAL | Status: AC | PRN

## 2016-11-18 MED ORDER — ACETAMINOPHEN 500 MG PO TABS
ORAL_TABLET | ORAL | Status: AC
  Filled 2016-11-18: qty 2

## 2016-11-18 MED ORDER — BUPIVACAINE HCL (PF) 0.25 % IJ SOLN
INTRAMUSCULAR | Status: AC
  Filled 2016-11-18: qty 30

## 2016-11-18 MED ORDER — PROPOFOL 10 MG/ML IV BOLUS
INTRAVENOUS | Status: DC | PRN
  Administered 2016-11-18: 300 mg via INTRAVENOUS

## 2016-11-18 MED ORDER — BUPIVACAINE-EPINEPHRINE (PF) 0.5% -1:200000 IJ SOLN
INTRAMUSCULAR | Status: DC | PRN
  Administered 2016-11-18: 25 mL via PERINEURAL

## 2016-11-18 MED ORDER — CELECOXIB 400 MG PO CAPS: 400.0000 mg | ORAL_CAPSULE | ORAL | Status: DC

## 2016-11-18 MED ORDER — FENTANYL CITRATE (PF) 100 MCG/2ML IJ SOLN
INTRAMUSCULAR | Status: AC
  Filled 2016-11-18: qty 2

## 2016-11-18 MED ORDER — MIDAZOLAM HCL 2 MG/2ML IJ SOLN
1.0000 mg | INTRAMUSCULAR | Status: DC | PRN
  Administered 2016-11-18 (×2): 2 mg via INTRAVENOUS

## 2016-11-18 MED ORDER — SCOPOLAMINE 1 MG/3DAYS TD PT72: 1.0000 | MEDICATED_PATCH | Freq: Once | TRANSDERMAL | Status: DC | PRN

## 2016-11-18 MED ORDER — DEXAMETHASONE SODIUM PHOSPHATE 10 MG/ML IJ SOLN
INTRAMUSCULAR | Status: AC
Start: 2016-11-18 — End: 2016-11-18
  Filled 2016-11-18: qty 1

## 2016-11-18 MED ORDER — CELECOXIB 200 MG PO CAPS
ORAL_CAPSULE | ORAL | Status: AC
  Filled 2016-11-18: qty 2

## 2016-11-18 MED ORDER — DEXAMETHASONE SODIUM PHOSPHATE 4 MG/ML IJ SOLN
INTRAMUSCULAR | Status: DC | PRN
  Administered 2016-11-18: 10 mg via INTRAVENOUS

## 2016-11-18 MED ORDER — CEFAZOLIN SODIUM-DEXTROSE 2-4 GM/100ML-% IV SOLN
2.0000 g | INTRAVENOUS | Status: AC
  Administered 2016-11-18: 2 g via INTRAVENOUS

## 2016-11-18 MED ORDER — ONDANSETRON HCL 4 MG/2ML IJ SOLN
INTRAMUSCULAR | Status: DC | PRN
  Administered 2016-11-18: 4 mg via INTRAVENOUS

## 2016-11-18 MED ORDER — OXYCODONE HCL 5 MG PO TABS
5.0000 mg | ORAL_TABLET | Freq: Once | ORAL | Status: AC | PRN
  Administered 2016-11-18: 5 mg via ORAL

## 2016-11-18 MED ORDER — LIDOCAINE 2% (20 MG/ML) 5 ML SYRINGE
INTRAMUSCULAR | Status: AC
  Filled 2016-11-18: qty 5

## 2016-11-18 MED ORDER — METHYLENE BLUE 0.5 % INJ SOLN
INTRAVENOUS | Status: AC
  Filled 2016-11-18: qty 10

## 2016-11-18 MED ORDER — CEFAZOLIN SODIUM-DEXTROSE 2-4 GM/100ML-% IV SOLN
INTRAVENOUS | Status: AC
  Filled 2016-11-18: qty 100

## 2016-11-18 SURGICAL SUPPLY — 41 items
APPLIER CLIP 9.375 MED OPEN (MISCELLANEOUS) ×4
BLADE SURG 15 STRL LF DISP TIS (BLADE) ×1 IMPLANT
BLADE SURG 15 STRL SS (BLADE) ×1
CANISTER SUC SOCK COL 7IN (MISCELLANEOUS) IMPLANT
CANISTER SUCT 1200ML W/VALVE (MISCELLANEOUS) IMPLANT
CHLORAPREP W/TINT 26ML (MISCELLANEOUS) ×2 IMPLANT
CLIP APPLIE 9.375 MED OPEN (MISCELLANEOUS) ×2 IMPLANT
COVER BACK TABLE 60X90IN (DRAPES) ×2 IMPLANT
COVER MAYO STAND STRL (DRAPES) ×2 IMPLANT
COVER PROBE W GEL 5X96 (DRAPES) ×2 IMPLANT
DECANTER SPIKE VIAL GLASS SM (MISCELLANEOUS) IMPLANT
DERMABOND ADVANCED (GAUZE/BANDAGES/DRESSINGS) ×1
DERMABOND ADVANCED .7 DNX12 (GAUZE/BANDAGES/DRESSINGS) ×1 IMPLANT
DEVICE DUBIN W/COMP PLATE 8390 (MISCELLANEOUS) ×2 IMPLANT
DRAPE LAPAROSCOPIC ABDOMINAL (DRAPES) ×2 IMPLANT
DRAPE UTILITY XL STRL (DRAPES) ×2 IMPLANT
ELECT COATED BLADE 2.86 ST (ELECTRODE) ×2 IMPLANT
ELECT REM PT RETURN 9FT ADLT (ELECTROSURGICAL) ×2
ELECTRODE REM PT RTRN 9FT ADLT (ELECTROSURGICAL) ×1 IMPLANT
GLOVE BIO SURGEON STRL SZ7.5 (GLOVE) ×2 IMPLANT
GOWN STRL REUS W/ TWL LRG LVL3 (GOWN DISPOSABLE) ×2 IMPLANT
GOWN STRL REUS W/TWL LRG LVL3 (GOWN DISPOSABLE) ×2
ILLUMINATOR WAVEGUIDE N/F (MISCELLANEOUS) ×2 IMPLANT
KIT MARKER MARGIN INK (KITS) ×2 IMPLANT
LIGHT WAVEGUIDE WIDE FLAT (MISCELLANEOUS) IMPLANT
NDL SAFETY ECLIPSE 18X1.5 (NEEDLE) IMPLANT
NEEDLE HYPO 18GX1.5 SHARP (NEEDLE)
NEEDLE HYPO 25X1 1.5 SAFETY (NEEDLE) ×2 IMPLANT
NS IRRIG 1000ML POUR BTL (IV SOLUTION) IMPLANT
PACK BASIN DAY SURGERY FS (CUSTOM PROCEDURE TRAY) ×2 IMPLANT
PENCIL BUTTON HOLSTER BLD 10FT (ELECTRODE) ×2 IMPLANT
SLEEVE SCD COMPRESS KNEE MED (MISCELLANEOUS) ×2 IMPLANT
SPONGE LAP 18X18 X RAY DECT (DISPOSABLE) ×2 IMPLANT
SUT MON AB 4-0 PC3 18 (SUTURE) ×4 IMPLANT
SUT SILK 2 0 SH (SUTURE) IMPLANT
SUT VICRYL 3-0 CR8 SH (SUTURE) ×2 IMPLANT
SYR CONTROL 10ML LL (SYRINGE) ×2 IMPLANT
TOWEL OR 17X24 6PK STRL BLUE (TOWEL DISPOSABLE) ×2 IMPLANT
TOWEL OR NON WOVEN STRL DISP B (DISPOSABLE) ×2 IMPLANT
TUBE CONNECTING 20X1/4 (TUBING) IMPLANT
YANKAUER SUCT BULB TIP NO VENT (SUCTIONS) IMPLANT

## 2016-11-18 NOTE — H&P (Signed)
Lisa Anthony  Location: [facility] Patient #: [id number] DOB: [birth year] Undefined / Language: Cleophus Molt / Race: White Female   History of Present Illness  The patient is a 51 year old female who presents with breast cancer. We are asked to see the patient in consultation by Dr. Lisbeth Renshaw to evaluate her for a new right breast cancer. The patient is a 51 year old white female who presents with a screening mammogram that showed abnormal calcifications in the upper right breast. This measured 1.1cm. The lymph nodes looked neg. 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 She does not smoke or take hormone replacement. She has a family history of breast cancer in a maternal grandmother and cousin.   Past Surgical History  Breast Biopsy  Right.  Diagnostic Studies History Mammogram  within last year Pap Smear  1-5 years ago  Medication History  Medications Reconciled  Social History  Alcohol use  Occasional alcohol use. Caffeine use  Carbonated beverages, Coffee. No drug use  Tobacco use  Former smoker.  Family History  Breast Cancer  Family Members In General. Heart Disease  Family Members In General, Father. Heart disease in female family member before age 32  Hypertension  Sister.  Pregnancy / Birth History  Age at menarche  67 years. Contraceptive History  Contraceptive implant, Oral contraceptives. Gravida  3 Length (months) of breastfeeding  12-24 Maternal age  23-35 Para  3 Regular periods   Other Problems  Anxiety Disorder  Hypercholesterolemia  Kidney Stone  Lump In Breast     Review of Systems ( General Not Present- Appetite Loss, Chills, Fatigue, Fever, Night Sweats, Weight Gain and Weight Loss. Skin Not Present- Change in Wart/Mole, Dryness, Hives, Jaundice, New Lesions, Non-Healing Wounds, Rash and Ulcer. HEENT Not Present- Earache, Hearing Loss,  Hoarseness, Nose Bleed, Oral Ulcers, Ringing in the Ears, Seasonal Allergies, Sinus Pain, Sore Throat, Visual Disturbances, Wears glasses/contact lenses and Yellow Eyes. Respiratory Not Present- Bloody sputum, Chronic Cough, Difficulty Breathing, Snoring and Wheezing. Breast Present- Breast Mass. Not Present- Breast Pain, Nipple Discharge and Skin Changes. Cardiovascular Not Present- Chest Pain, Difficulty Breathing Lying Down, Leg Cramps, Palpitations, Rapid Heart Rate, Shortness of Breath and Swelling of Extremities. Gastrointestinal Not Present- Abdominal Pain, Bloating, Bloody Stool, Change in Bowel Habits, Chronic diarrhea, Constipation, Difficulty Swallowing, Excessive gas, Gets full quickly at meals, Hemorrhoids, Indigestion, Nausea, Rectal Pain and Vomiting. Female Genitourinary Not Present- Frequency, Nocturia, Painful Urination, Pelvic Pain and Urgency. Musculoskeletal Not Present- Back Pain, Joint Pain, Joint Stiffness, Muscle Pain, Muscle Weakness and Swelling of Extremities. Neurological Not Present- Decreased Memory, Fainting, Headaches, Numbness, Seizures, Tingling, Tremor, Trouble walking and Weakness. Psychiatric Present- Anxiety. Not Present- Bipolar, Change in Sleep Pattern, Depression, Fearful and Frequent crying. Endocrine Not Present- Cold Intolerance, Excessive Hunger, Hair Changes, Heat Intolerance, Hot flashes and New Diabetes. Hematology Not Present- Blood Thinners, Easy Bruising, Excessive bleeding, Gland problems, HIV and Persistent Infections.   Physical Exam  General Mental Status-Alert. General Appearance-Consistent with stated age. Hydration-Well hydrated. Voice-Normal.  Head and Neck Head-normocephalic, atraumatic with no lesions or palpable masses. Trachea-midline. Thyroid Gland Characteristics - normal size and consistency.  Eye Eyeball - Bilateral-Extraocular movements intact. Sclera/Conjunctiva - Bilateral-No scleral icterus.  Chest  and Lung Exam Chest and lung exam reveals -quiet, even and easy respiratory effort with no use of accessory muscles and on auscultation, normal breath sounds, no adventitious sounds  and normal vocal resonance. Inspection Chest Wall - Normal. Back - normal.  Breast Note: There is a palpable bruise in the upper right breast. other than this there is no palpable mass in either breast. There is no palpable axillary, supraclavicular, or cervical lymphadenopathy.   Cardiovascular Cardiovascular examination reveals -normal heart sounds, regular rate and rhythm with no murmurs and normal pedal pulses bilaterally.  Abdomen Inspection Inspection of the abdomen reveals - No Hernias. Skin - Scar - no surgical scars. Palpation/Percussion Palpation and Percussion of the abdomen reveal - Soft, Non Tender, No Rebound tenderness, No Rigidity (guarding) and No hepatosplenomegaly. Auscultation Auscultation of the abdomen reveals - Bowel sounds normal.  Neurologic Neurologic evaluation reveals -alert and oriented x 3 with no impairment of recent or remote memory. Mental Status-Normal.  Musculoskeletal Normal Exam - Left-Upper Extremity Strength Normal and Lower Extremity Strength Normal. Normal Exam - Right-Upper Extremity Strength Normal and Lower Extremity Strength Normal.  Lymphatic Head & Neck  General Head & Neck Lymphatics: Bilateral - Description - Normal. Axillary  General Axillary Region: Bilateral - Description - Normal. Tenderness - Non Tender. Femoral & Inguinal  Generalized Femoral & Inguinal Lymphatics: Bilateral - Description - Normal. Tenderness - Non Tender.    Assessment & Plan  MALIGNANT NEOPLASM OF UPPER-OUTER QUADRANT OF RIGHT BREAST IN FEMALE, ESTROGEN RECEPTOR POSITIVE (C50.411) Impression: The patient appears to have a small stage I cancer in the upper right breast. I have talked to her in detail about the different options for treatment and at this point  she favors breast conservation. I think this is a very good way to treat her breast cancer. She is also a candidate for sentinel node mapping. I would plan for a right breast radioactive seed localized lumpectomy and sentinel node mapping. I have discussed with her in detail the risks and benenfits of the surgery as well as some of the technical aspects and she understands and wishes to proceed.

## 2016-11-18 NOTE — Interval H&P Note (Signed)
History and Physical Interval Note:  11/18/2016 1:23 PM  Lisa Anthony  has presented today for surgery, with the diagnosis of RIGHT BREAST CANCER  The various methods of treatment have been discussed with the patient and family. After consideration of risks, benefits and other options for treatment, the patient has consented to  Procedure(s): BREAST LUMPECTOMY WITH RADIOACTIVE SEED AND SENTINEL LYMPH NODE BIOPSY (Right) as a surgical intervention .  The patient's history has been reviewed, patient examined, no change in status, stable for surgery.  I have reviewed the patient's chart and labs.  Questions were answered to the patient's satisfaction.     TOTH III,Jamiyah Dingley S

## 2016-11-18 NOTE — Progress Notes (Signed)
  Assisted Dr. Hodierne with right, ultrasound guided, pectoralis block. Side rails up, monitors on throughout procedure. See vital signs in flow sheet. Tolerated Procedure well. 

## 2016-11-18 NOTE — Anesthesia Preprocedure Evaluation (Signed)
Anesthesia Evaluation  Patient identified by MRN, date of birth, ID band Patient awake    Reviewed: Allergy & Precautions, H&P , NPO status , Patient's Chart, lab work & pertinent test results  Airway Mallampati: II   Neck ROM: full    Dental   Pulmonary former smoker,    breath sounds clear to auscultation       Cardiovascular negative cardio ROS   Rhythm:regular Rate:Normal     Neuro/Psych    GI/Hepatic   Endo/Other    Renal/GU      Musculoskeletal   Abdominal   Peds  Hematology   Anesthesia Other Findings   Reproductive/Obstetrics Breast CA                             Anesthesia Physical Anesthesia Plan  ASA: II  Anesthesia Plan: General   Post-op Pain Management:  Regional for Post-op pain   Induction: Intravenous  Airway Management Planned: LMA  Additional Equipment:   Intra-op Plan:   Post-operative Plan:   Informed Consent: I have reviewed the patients History and Physical, chart, labs and discussed the procedure including the risks, benefits and alternatives for the proposed anesthesia with the patient or authorized representative who has indicated his/her understanding and acceptance.     Plan Discussed with: CRNA, Anesthesiologist and Surgeon  Anesthesia Plan Comments:         Anesthesia Quick Evaluation

## 2016-11-18 NOTE — Op Note (Signed)
11/18/2016  3:35 PM  PATIENT:  Lisa Anthony  51 y.o. female  PRE-OPERATIVE DIAGNOSIS:  RIGHT BREAST CANCER  POST-OPERATIVE DIAGNOSIS:  RIGHT BREAST CANCER  PROCEDURE:  Procedure(s): BREAST LUMPECTOMY WITH RADIOACTIVE SEED AND DEEP AXILLARY SENTINEL LYMPH NODE BIOPSY (Right)  SURGEON:  Surgeon(s) and Role:    * Jovita Kussmaul, MD - Primary  PHYSICIAN ASSISTANT:   ASSISTANTS: none   ANESTHESIA:   local and general  EBL:  Total I/O In: 1400 [I.V.:1400] Out: 25 [Blood:25]  BLOOD ADMINISTERED:none  DRAINS: none   LOCAL MEDICATIONS USED:  MARCAINE     SPECIMEN:  Source of Specimen:  right breast tissue with additional medial margin and sentinel node X 4  DISPOSITION OF SPECIMEN:  PATHOLOGY  COUNTS:  YES  TOURNIQUET:  * No tourniquets in log *  DICTATION: .Dragon Dictation   After informed consent was obtained the patient was brought to the operating room and placed in the supine position on the operating room table. After adequate induction of general anesthesia and the patient's right breast, chest, and axillary area were prepped with ChloraPrep, allowed to dry, and draped in usual sterile manner. An appropriate timeout was performed. Previously an I-125 seed was placed in the upper portion of the right breast to mark an area of invasive breast cancer. Earlier in the day the patient underwent injection of 1 mCi of technetium sulfur colloid in the subareolar position on the right. At this point the neoprobe was sent to technetium in the right axilla was examined. A hot spot was identified. A small transversely oriented incision was made with a 15 blade knife. The incision was carried through the skin and subcutaneous tissue sharply with electrocautery until the axilla was entered. The neoprobe was used to direct blunt hemostat dissection into the deep axillary lymph nodes. In doing so was able to identify 4 lymph nodes with increased radioactivity. Each of these was excised  sharply with the electrocautery and the lymphatics were controlled with clips. A small piece of additional tissue was also sent separately. Ex vivo counts on these nodes ranged from 200 to 2000. These were sent as sentinel nodes numbers 1 through 4. No other hot or palpable lymph nodes were identified in the right axilla. During the dissection of the lymph nodes I did identify one intercostal brachial nerve that was coursing through one of the lymph nodes and had to be divided between clips. At this point the axilla was examined and found to be hemostatic. The area was infiltrated with quarter percent Marcaine. The deep layer of the wound was closed with interrupted 3-0 Vicryl stitches. The skin was then closed with a running 4-0 Monocryl subcuticular stitch. Attention was then turned to the right breast. The neoprobe was sent to I-125. The area of radioactivity was readily identified in the upper portion of the right breast. The area around this was infiltrated with quarter percent Marcaine. A curvilinear incision was then made along the upper edge of the areola. The incision was carried through the skin and subcutaneous continuous tissue sharply with the electrocautery. The neoprobe was then used to direct dissection into the upper portion of the right breast. Once I approach the radioactive seed then a circular portion of breast tissue was excised sharply around the radioactive seed while checking the area of radioactivity frequently with the neoprobe. Once the specimen was removed it was oriented with the appropriate paint colors. A specimen radiograph was obtained that showed the clip and seed to  be near the center of the specimen. I did think I might be a little bit close on the medial edge so I did excise some tissue on the medial edge of the cavity and marked this appropriately. This tissue was also sent to pathology for further evaluation. The wound was then irrigated with saline and infiltrated with  quarter percent Marcaine. The cavity was marked with clips. The deep layers of the wound was closed with layers of interrupted 3-0 Vicryl stitches. The skin was then closed with interrupted 4-0 Monocryl subcuticular stitches. Dermabond dressings were applied. The patient tolerated the procedure well. At the end of the case all needle sponge and instrument counts were correct. The patient was then awakened and taken to recovery in stable condition.  PLAN OF CARE: Discharge to home after PACU  PATIENT DISPOSITION:  PACU - hemodynamically stable.   Delay start of Pharmacological VTE agent (>24hrs) due to surgical blood loss or risk of bleeding: not applicable

## 2016-11-18 NOTE — Discharge Instructions (Signed)

## 2016-11-18 NOTE — Anesthesia Procedure Notes (Signed)
Anesthesia Regional Block: Pectoralis block   Pre-Anesthetic Checklist: ,, timeout performed, Correct Patient, Correct Site, Correct Laterality, Correct Procedure, Correct Position, site marked, Risks and benefits discussed,  Surgical consent,  Pre-op evaluation,  At surgeon's request and post-op pain management  Laterality: Right  Prep: chloraprep       Needles:  Injection technique: Single-shot  Needle Type: Echogenic Needle     Needle Length: 9cm  Needle Gauge: 21     Additional Needles:   Procedures: ultrasound guided,,,,,,,,  Narrative:  Start time: 11/18/2016 1:11 PM End time: 11/18/2016 1:19 PM Injection made incrementally with aspirations every 5 mL.  Performed by: Personally  Anesthesiologist: Kylei Purington  Additional Notes: Pt tolerated the procedure well.

## 2016-11-18 NOTE — Progress Notes (Signed)
Emotional support during breast injections °

## 2016-11-18 NOTE — Transfer of Care (Signed)
Immediate Anesthesia Transfer of Care Note  Patient: Lisa Anthony  Procedure(s) Performed: Procedure(s): BREAST LUMPECTOMY WITH RADIOACTIVE SEED AND SENTINEL LYMPH NODE BIOPSY (Right)  Patient Location: PACU  Anesthesia Type:General  Level of Consciousness: awake and sedated  Airway & Oxygen Therapy: Patient Spontanous Breathing and Patient connected to face mask oxygen  Post-op Assessment: Report given to RN and Post -op Vital signs reviewed and stable  Post vital signs: Reviewed and stable  Last Vitals:  Vitals:   11/18/16 1325 11/18/16 1326  BP:    Pulse: (!) 114 (!) 107  Resp: (!) 24 16  Temp:      Last Pain:  Vitals:   11/18/16 1234  TempSrc: Oral      Patients Stated Pain Goal: 3 ([id number])  Complications: No apparent anesthesia complications

## 2016-11-18 NOTE — Anesthesia Procedure Notes (Signed)
Procedure Name: LMA Insertion Performed by: Skylur Fuston W Pre-anesthesia Checklist: Patient identified, Emergency Drugs available, Suction available and Patient being monitored Patient Re-evaluated:Patient Re-evaluated prior to inductionOxygen Delivery Method: Circle system utilized Preoxygenation: Pre-oxygenation with 100% oxygen Intubation Type: IV induction Ventilation: Mask ventilation without difficulty LMA: LMA inserted LMA Size: 4.0 Number of attempts: 2 Placement Confirmation: positive ETCO2 Tube secured with: Tape Dental Injury: Teeth and Oropharynx as per pre-operative assessment        

## 2016-11-19 ENCOUNTER — Encounter (HOSPITAL_BASED_OUTPATIENT_CLINIC_OR_DEPARTMENT_OTHER): Payer: Self-pay | Admitting: General Surgery

## 2016-11-19 NOTE — Anesthesia Postprocedure Evaluation (Addendum)
Anesthesia Post Note  Patient: Lisa Anthony  Procedure(s) Performed: Procedure(s) (LRB): BREAST LUMPECTOMY WITH RADIOACTIVE SEED AND SENTINEL LYMPH NODE BIOPSY (Right)  Patient location during evaluation: PACU Anesthesia Type: General Level of consciousness: awake and alert and patient cooperative Pain management: pain level controlled Vital Signs Assessment: post-procedure vital signs reviewed and stable Respiratory status: spontaneous breathing and respiratory function stable Cardiovascular status: stable Anesthetic complications: no       Last Vitals:  Vitals:   11/18/16 1615 11/18/16 1644  BP: 123/82 135/84  Pulse: 94 84  Resp: 15 16  Temp:  36.9 C    Last Pain:  Vitals:   11/18/16 1644  TempSrc: Oral  PainSc: [place]

## 2016-11-23 ENCOUNTER — Telehealth: Payer: Self-pay | Admitting: *Deleted

## 2016-11-23 NOTE — Telephone Encounter (Signed)
Received order per Dr. Gudena for oncotype testing. Requisition sent to pathology. Received by Keisha 

## 2016-11-25 ENCOUNTER — Ambulatory Visit (HOSPITAL_BASED_OUTPATIENT_CLINIC_OR_DEPARTMENT_OTHER): Payer: 59 | Admitting: Hematology and Oncology

## 2016-11-25 ENCOUNTER — Encounter: Payer: Self-pay | Admitting: Hematology and Oncology

## 2016-11-25 DIAGNOSIS — Z170 Estrogen receptor positive status [ER+]: Secondary | ICD-10-CM | POA: Diagnosis not present

## 2016-11-25 DIAGNOSIS — C50211 Malignant neoplasm of upper-inner quadrant of right female breast: Secondary | ICD-10-CM | POA: Diagnosis not present

## 2016-11-25 NOTE — Progress Notes (Signed)
Patient Care Team: No Pcp Per Patient as PCP - General (General Practice) Lovell Sheehan, NP as Nurse Practitioner (Nurse Practitioner) Autumn Messing III, MD as Consulting Physician (General Surgery) Nicholas Lose, MD as Consulting Physician (Hematology and Oncology) Kyung Rudd, MD as Consulting Physician (Radiation Oncology)  DIAGNOSIS:  Encounter Diagnosis  Name Primary?  . Malignant neoplasm of upper-inner quadrant of right breast in female, estrogen receptor positive ([place])     SUMMARY OF ONCOLOGIC HISTORY:   Malignant neoplasm of upper-inner quadrant of right breast in female, estrogen receptor positive ([place])   10/20/2016 Initial Diagnosis    Right breast biopsy 12:30 position: IDC grade 3, ER 100%, PR 95%, Ki-67 30%, HER-2 negative ratio 1.31, screening detected right breast asymmetry and calcifications 1.1 cm, right axillary borderline enlarged lymph node biopsy benign, T1c N0 stage IA clinical stage      11/18/2016 Surgery    Right lumpectomy: IDC with DCIS, 1 cm, margins negative, 0/4 lymph nodes, ER 100%, PR 95%, Ki-67 30%, HER-2 negative ratio 1.31, T1 BN 0 stage IA        Genetic testing   11/16/2016 Initial Diagnosis    Genetic testing was negative for mutations in the 43 genes on [employer]'s Common Cancers panel (APC, ATM, AXIN2, BARD1, BMPR1A, BRCA1, BRCA2, BRIP1, CDH1, CDKN2A, CHEK2, DICER1, EPCAM, GREM1, HOXB13, KIT, MEN1, MLH1, MSH2, MSH6, MUTYH, NBN, NF1, PALB2, PDGFRA, PMS2, POLD1, POLE, PTEN, RAD50, RAD51C, RAD51D, SDHA, SDHB, SDHC, SDHD, SMAD4, SMARCA4, STK11, TP53, TSC1, TSC2, VHL).       CHIEF COMPLIANT: Follow-up after recent surgery  INTERVAL HISTORY: Lisa Anthony is a 51 year old with above-mentioned C right breast cancer underwent lumpectomy on 11/18/2016 and is here today to discuss the results. She is recovering fairly well from the surgery. She complains of mild fullness in the right breast and axilla. Denies any pain or discomfort.  REVIEW OF SYSTEMS:     Constitutional: Denies fevers, chills or abnormal weight loss Eyes: Denies blurriness of vision Ears, nose, mouth, throat, and face: Denies mucositis or sore throat Respiratory: Denies cough, dyspnea or wheezes Cardiovascular: Denies palpitation, chest discomfort Gastrointestinal:  Denies nausea, heartburn or change in bowel habits Skin: Denies abnormal skin rashes Lymphatics: Denies new lymphadenopathy or easy bruising Neurological:Denies numbness, tingling or new weaknesses Behavioral/Psych: Mood is stable, no new changes  Extremities: No lower extremity edema Breast: Recent lumpectomy right breast All other systems were reviewed with the patient and are negative.  I have reviewed the past medical history, past surgical history, social history and family history with the patient and they are unchanged from previous note.  ALLERGIES:  is allergic to sulfa antibiotics.  MEDICATIONS:  Current Outpatient Prescriptions  Medication Sig Dispense Refill  . Doxylamine Succinate, Sleep, (SLEEP AID PO) Take by mouth.    Marland Kitchen HYDROcodone-acetaminophen (NORCO/VICODIN) 5-325 MG tablet Take 1-2 tablets by mouth every 4 (four) hours as needed for moderate pain or severe pain. 15 tablet 0  . Minocycline HCl (SOLODYN) 80 MG TB24 Take by mouth. 1-2 tablets per week     No current facility-administered medications for this visit.     PHYSICAL EXAMINATION: ECOG PERFORMANCE STATUS: 1 - Symptomatic but completely ambulatory  Vitals:   11/25/16 1421  BP: 134/81  Pulse: 81  Resp: 20  Temp: 97.4 F (36.3 C)   Filed Weights   11/25/16 1421  Weight: 147 lb 6.4 oz (66.9 kg)    GENERAL:alert, no distress and comfortable SKIN: skin color, texture, turgor are normal, no rashes  or significant lesions EYES: normal, Conjunctiva are pink and non-injected, sclera clear OROPHARYNX:no exudate, no erythema and lips, buccal mucosa, and tongue normal  NECK: supple, thyroid normal size, non-tender, without  nodularity LYMPH:  no palpable lymphadenopathy in the cervical, axillary or inguinal LUNGS: clear to auscultation and percussion with normal breathing effort HEART: regular rate & rhythm and no murmurs and no lower extremity edema ABDOMEN:abdomen soft, non-tender and normal bowel sounds MUSCULOSKELETAL:no cyanosis of digits and no clubbing  NEURO: alert & oriented x 3 with fluent speech, no focal motor/sensory deficits EXTREMITIES: No lower extremity edema  LABORATORY DATA:  I have reviewed the data as listed   Chemistry      Component Value Date/Time   NA 137 10/27/2016 1231   K 3.9 10/27/2016 1231   CL 103 12/13/2015 0229   CO2 25 10/27/2016 1231   BUN 12.0 10/27/2016 1231   CREATININE 0.8 10/27/2016 1231      Component Value Date/Time   CALCIUM 9.6 10/27/2016 1231   ALKPHOS 64 10/27/2016 1231   AST 22 10/27/2016 1231   ALT 22 10/27/2016 1231   BILITOT 0.77 10/27/2016 1231       Lab Results  Component Value Date   WBC 8.4 10/27/2016   HGB 13.2 10/27/2016   HCT 38.7 10/27/2016   MCV 94.6 10/27/2016   PLT 304 10/27/2016   NEUTROABS 5.1 10/27/2016    ASSESSMENT & PLAN:  Malignant neoplasm of upper-inner quadrant of right breast in female, estrogen receptor positive ([facility]) Right lumpectomy: IDC with DCIS, 1 cm, margins negative, 0/4 lymph nodes, ER 100%, PR 95%, Ki-67 30%, HER-2 negative ratio 1.31, T1 BN 0 stage IA  Pathology counseling: I discussed the final pathology report of the patient provided  a copy of this report. I discussed the margins as well as lymph node surgeries. We also discussed the final staging along with previously performed ER/PR and HER-2/neu testing.  Recommendation: 1. Oncotype DX test to determine if she would benefit from chemotherapy 2. radiation therapy followed by 3. Adjuvant antiestrogen therapy  Return to clinic based upon Oncotype DX test result   I spent 25 minutes talking to the patient of which more than half was spent in  counseling and coordination of care.  No orders of the defined types were placed in this encounter.  The patient has a good understanding of the overall plan. she agrees with it. she will call with any problems that may develop before the next visit here.   Rulon Eisenmenger, MD 11/25/16

## 2016-11-25 NOTE — Assessment & Plan Note (Signed)
Right lumpectomy: IDC with DCIS, 1 cm, margins negative, 0/4 lymph nodes, ER 100%, PR 95%, Ki-67 30%, HER-2 negative ratio 1.31, T1 BN 0 stage IA  Pathology counseling: I discussed the final pathology report of the patient provided  a copy of this report. I discussed the margins as well as lymph node surgeries. We also discussed the final staging along with previously performed ER/PR and HER-2/neu testing.  Recommendation: 1. Oncotype DX test to determine if she would benefit from chemotherapy 2. radiation therapy followed by 3. Adjuvant antiestrogen therapy  Return to clinic based upon Oncotype DX test result

## 2016-11-30 DIAGNOSIS — Z170 Estrogen receptor positive status [ER+]: Secondary | ICD-10-CM | POA: Diagnosis not present

## 2016-11-30 DIAGNOSIS — C50211 Malignant neoplasm of upper-inner quadrant of right female breast: Secondary | ICD-10-CM | POA: Diagnosis not present

## 2016-12-01 ENCOUNTER — Telehealth: Payer: Self-pay | Admitting: *Deleted

## 2016-12-01 ENCOUNTER — Encounter (HOSPITAL_COMMUNITY): Payer: Self-pay

## 2016-12-01 NOTE — Telephone Encounter (Signed)
Received oncotype results of 27.  Spoke with patient to inform her and scheduled appointment with Dr. Lindi Adie to discuss for 12/03/16 at 815am,

## 2016-12-03 ENCOUNTER — Encounter: Payer: Self-pay | Admitting: *Deleted

## 2016-12-03 ENCOUNTER — Telehealth: Payer: Self-pay | Admitting: Hematology and Oncology

## 2016-12-03 ENCOUNTER — Ambulatory Visit (HOSPITAL_BASED_OUTPATIENT_CLINIC_OR_DEPARTMENT_OTHER): Payer: 59 | Admitting: Hematology and Oncology

## 2016-12-03 ENCOUNTER — Encounter: Payer: Self-pay | Admitting: Hematology and Oncology

## 2016-12-03 VITALS — BP 120/79 | HR 76 | Temp 97.7°F | Resp 20 | Ht 66.0 in | Wt 146.2 lb

## 2016-12-03 DIAGNOSIS — Z170 Estrogen receptor positive status [ER+]: Secondary | ICD-10-CM

## 2016-12-03 DIAGNOSIS — C50211 Malignant neoplasm of upper-inner quadrant of right female breast: Secondary | ICD-10-CM

## 2016-12-03 MED ORDER — ONDANSETRON HCL 8 MG PO TABS: 8.0000 mg | ORAL_TABLET | Freq: Two times a day (BID) | ORAL | 1 refills | Status: DC | PRN

## 2016-12-03 MED ORDER — LORAZEPAM 0.5 MG PO TABS: 0.5000 mg | ORAL_TABLET | Freq: Four times a day (QID) | ORAL | 0 refills | Status: DC | PRN

## 2016-12-03 MED ORDER — DEXAMETHASONE 4 MG PO TABS: 4.0000 mg | ORAL_TABLET | Freq: Every day | ORAL | 0 refills | Status: DC

## 2016-12-03 MED ORDER — VENLAFAXINE HCL ER 37.5 MG PO CP24: 37.5000 mg | ORAL_CAPSULE | Freq: Every day | ORAL | 3 refills | Status: DC

## 2016-12-03 MED ORDER — LIDOCAINE-PRILOCAINE 2.5-2.5 % EX CREA: TOPICAL_CREAM | CUTANEOUS | 3 refills | Status: DC

## 2016-12-03 MED ORDER — PROCHLORPERAZINE MALEATE 10 MG PO TABS: 10.0000 mg | ORAL_TABLET | Freq: Four times a day (QID) | ORAL | 1 refills | Status: DC | PRN

## 2016-12-03 NOTE — Assessment & Plan Note (Signed)
Right lumpectomy: IDC with DCIS, 1 cm, margins negative, 0/4 lymph nodes, ER 100%, PR 95%, Ki-67 30%, HER-2 negative ratio 1.31, T1 BN 0 stage IA Oncotype DX score 27: 18% risk of recurrence with tamoxifen alone intermediate risk  Oncotype counseling: I discussed with her the details of Oncotype DX test result which showed that she had a high intermediate risk of recurrence score of 27. This translates into 18% risk of recurrence with tamoxifen alone.  Recommendation: 1. I recommended adjuvant chemotherapy with Taxotere and Cytoxan every 3 weeks 4 2. radiation therapy followed by 3. Adjuvant antiestrogen therapy  Chemotherapy Counseling: I discussed the risks and benefits of chemotherapy including the risks of nausea/ vomiting, risk of infection from low WBC count, fatigue due to chemo or anemia, bruising or bleeding due to low platelets, mouth sores, loss/ change in taste and decreased appetite. Liver and kidney function will be monitored through out chemotherapy as abnormalities in liver and kidney function may be a side effect of treatment. Risk of permanent bone marrow dysfunction and leukemia due to chemo were also discussed.  Plan: 1. Port placement 2. Chemotherapy class  Start chemotherapy in 1-2 weeks

## 2016-12-03 NOTE — Progress Notes (Signed)
START ON PATHWAY REGIMEN - Breast     A cycle is every 21 days:     Docetaxel      Cyclophosphamide   **Always confirm dose/schedule in your pharmacy ordering system**    Patient Characteristics: Adjuvant Therapy, Node Negative, HER2/neu Negative/Unknown/Equivocal, ER Positive, Oncotype Intermediate Risk (18 - 30), Chemotherapy Preferred AJCC Stage Grouping: IA Current Disease Status: No Distant Mets or Local Recurrence AJCC M Stage: 0 ER Status: Positive (+) AJCC N Stage: 0 AJCC T Stage: 1c HER2/neu: Negative (-) PR Status: Positive (+) Node Status: Negative (-) Has this patient completed genomic testing? Yes - Oncotype DX(R) Oncotype Recurrence Score: 27 Treatment Preferred: Chemotherapy  Intent of Therapy: Curative Intent, Discussed with Patient

## 2016-12-03 NOTE — Telephone Encounter (Signed)
Gave patient avs report and appointments for March thru June. °

## 2016-12-03 NOTE — Progress Notes (Signed)
Patient Care Team: No Pcp Per Patient as PCP - General (General Practice) Lovell Sheehan, NP as Nurse Practitioner (Nurse Practitioner) Autumn Messing III, MD as Consulting Physician (General Surgery) Nicholas Lose, MD as Consulting Physician (Hematology and Oncology) Kyung Rudd, MD as Consulting Physician (Radiation Oncology)  DIAGNOSIS:  Encounter Diagnosis  Name Primary?  . Malignant neoplasm of upper-inner quadrant of right breast in female, estrogen receptor positive ([place])     SUMMARY OF ONCOLOGIC HISTORY:   Malignant neoplasm of upper-inner quadrant of right breast in female, estrogen receptor positive ([place])   10/20/2016 Initial Diagnosis    Right breast biopsy 12:30 position: IDC grade 3, ER 100%, PR 95%, Ki-67 30%, HER-2 negative ratio 1.31, screening detected right breast asymmetry and calcifications 1.1 cm, right axillary borderline enlarged lymph node biopsy benign, T1c N0 stage IA clinical stage      11/18/2016 Surgery    Right lumpectomy: IDC with DCIS, 1 cm, margins negative, 0/4 lymph nodes, ER 100%, PR 95%, Ki-67 30%, HER-2 negative ratio 1.31, T1 BN 0 stage IA       11/25/2016 Oncotype testing    Oncotype DX score 27: 18% risk of recurrence with tamoxifen alone intermediate risk       Genetic testing   11/16/2016 Initial Diagnosis    Genetic testing was negative for mutations in the 43 genes on [employer]'s Common Cancers panel (APC, ATM, AXIN2, BARD1, BMPR1A, BRCA1, BRCA2, BRIP1, CDH1, CDKN2A, CHEK2, DICER1, EPCAM, GREM1, HOXB13, KIT, MEN1, MLH1, MSH2, MSH6, MUTYH, NBN, NF1, PALB2, PDGFRA, PMS2, POLD1, POLE, PTEN, RAD50, RAD51C, RAD51D, SDHA, SDHB, SDHC, SDHD, SMAD4, SMARCA4, STK11, TP53, TSC1, TSC2, VHL).       CHIEF COMPLIANT: Follow-up to discuss Oncotype DX score  INTERVAL HISTORY: Lisa Anthony is a 51 year old with above-mentioned history of right breast cancer underwent lumpectomy and is here to discuss Oncotype DX score result. She is accompanied by her mother.  The breast has been healing very well.  REVIEW OF SYSTEMS:   Constitutional: Denies fevers, chills or abnormal weight loss Eyes: Denies blurriness of vision Ears, nose, mouth, throat, and face: Denies mucositis or sore throat Respiratory: Denies cough, dyspnea or wheezes Cardiovascular: Denies palpitation, chest discomfort Gastrointestinal:  Denies nausea, heartburn or change in bowel habits Skin: Denies abnormal skin rashes Lymphatics: Denies new lymphadenopathy or easy bruising Neurological:Denies numbness, tingling or new weaknesses Behavioral/Psych: Mood is stable, no new changes  Extremities: No lower extremity edema  All other systems were reviewed with the patient and are negative.  I have reviewed the past medical history, past surgical history, social history and family history with the patient and they are unchanged from previous note.  ALLERGIES:  is allergic to sulfa antibiotics.  MEDICATIONS:  Current Outpatient Prescriptions  Medication Sig Dispense Refill  . Doxylamine Succinate, Sleep, (SLEEP AID PO) Take by mouth.    Marland Kitchen HYDROcodone-acetaminophen (NORCO/VICODIN) 5-325 MG tablet Take 1-2 tablets by mouth every 4 (four) hours as needed for moderate pain or severe pain. 15 tablet 0  . Minocycline HCl (SOLODYN) 80 MG TB24 Take by mouth. 1-2 tablets per week     No current facility-administered medications for this visit.     PHYSICAL EXAMINATION: ECOG PERFORMANCE STATUS: 1 - Symptomatic but completely ambulatory  There were no vitals filed for this visit. There were no vitals filed for this visit.  GENERAL:alert, no distress and comfortable SKIN: skin color, texture, turgor are normal, no rashes or significant lesions EYES: normal, Conjunctiva are pink and non-injected, sclera clear  OROPHARYNX:no exudate, no erythema and lips, buccal mucosa, and tongue normal  NECK: supple, thyroid normal size, non-tender, without nodularity LYMPH:  no palpable lymphadenopathy in  the cervical, axillary or inguinal LUNGS: clear to auscultation and percussion with normal breathing effort HEART: regular rate & rhythm and no murmurs and no lower extremity edema ABDOMEN:abdomen soft, non-tender and normal bowel sounds MUSCULOSKELETAL:no cyanosis of digits and no clubbing  NEURO: alert & oriented x 3 with fluent speech, no focal motor/sensory deficits EXTREMITIES: No lower extremity edema  LABORATORY DATA:  I have reviewed the data as listed   Chemistry      Component Value Date/Time   NA 137 10/27/2016 1231   K 3.9 10/27/2016 1231   CL 103 12/13/2015 0229   CO2 25 10/27/2016 1231   BUN 12.0 10/27/2016 1231   CREATININE 0.8 10/27/2016 1231      Component Value Date/Time   CALCIUM 9.6 10/27/2016 1231   ALKPHOS 64 10/27/2016 1231   AST 22 10/27/2016 1231   ALT 22 10/27/2016 1231   BILITOT 0.77 10/27/2016 1231       Lab Results  Component Value Date   WBC 8.4 10/27/2016   HGB 13.2 10/27/2016   HCT 38.7 10/27/2016   MCV 94.6 10/27/2016   PLT 304 10/27/2016   NEUTROABS 5.1 10/27/2016    ASSESSMENT & PLAN:  Malignant neoplasm of upper-inner quadrant of right breast in female, estrogen receptor positive ([facility]) Right lumpectomy: IDC with DCIS, 1 cm, margins negative, 0/4 lymph nodes, ER 100%, PR 95%, Ki-67 30%, HER-2 negative ratio 1.31, T1 BN 0 stage IA Oncotype DX score 27: 18% risk of recurrence with tamoxifen alone intermediate risk  Oncotype counseling: I discussed with her the details of Oncotype DX test result which showed that she had a high intermediate risk of recurrence score of 27. This translates into 18% risk of recurrence with tamoxifen alone.  Recommendation: 1. I recommended adjuvant chemotherapy with Taxotere and Cytoxan every 3 weeks 4 2. radiation therapy followed by 3. Adjuvant antiestrogen therapy  Chemotherapy Counseling: I discussed the risks and benefits of chemotherapy including the risks of nausea/ vomiting, risk of infection  from low WBC count, fatigue due to chemo or anemia, bruising or bleeding due to low platelets, mouth sores, loss/ change in taste and decreased appetite. Liver and kidney function will be monitored through out chemotherapy as abnormalities in liver and kidney function may be a side effect of treatment. Risk of permanent bone marrow dysfunction and leukemia due to chemo were also discussed.  Plan: 1. Port placement 2. Chemotherapy class  Start chemotherapy in  April 3  I spent 25 minutes talking to the patient of which more than half was spent in counseling and coordination of care.  No orders of the defined types were placed in this encounter.  The patient has a good understanding of the overall plan. she agrees with it. she will call with any problems that may develop before the next visit here.   Rulon Eisenmenger, MD 12/03/16

## 2016-12-06 ENCOUNTER — Ambulatory Visit: Payer: Self-pay | Admitting: General Surgery

## 2016-12-07 ENCOUNTER — Encounter: Payer: Self-pay | Admitting: *Deleted

## 2016-12-07 ENCOUNTER — Encounter: Payer: Self-pay | Admitting: Hematology and Oncology

## 2016-12-07 ENCOUNTER — Other Ambulatory Visit: Payer: 59

## 2016-12-07 NOTE — Progress Notes (Signed)
Called patient to introduce myself as her Estate manager/land agent and to see if she had any financial questions or concerns. Asked patient if she has met her deductible/OOP for the year. She states if not she is probably not far. Advised patient to check with her insurance company to get specific details regarding this and if she hasn't, there is a progam she may apply for through [place] for Neulasta. Gave patient my contact name and number to return my call if she needs assistance. Patient verbalized understanding.

## 2016-12-08 ENCOUNTER — Encounter: Payer: Self-pay | Admitting: Hematology and Oncology

## 2016-12-08 NOTE — Progress Notes (Signed)
Patient returned call after speaking with her insurance company and she has not met her OOP as of yet. Advised patient that I would enroll her in [place] for Neulasta.  Completed enrollment online. Patient approved for up to $10,000 with no out of pocket expense for the first dose and each additional dose would only leave her with a $25 co-pay. Called patient back to share this information. Patient very appreciative and has my name and number for any additional questions or concerns.

## 2016-12-09 NOTE — Pre-Procedure Instructions (Signed)
Lisa Anthony  12/09/2016      [id number] - [place], [place] - [address] [place] [address] Phone: [phone] Fax: [phone]    Your procedure is scheduled on   Monday  12/13/16  Report to [facility] Admitting at [place] A[facility]  Call this number if you have problems the morning of surgery:  [phone]   Remember:  Do not eat food or drink liquids after midnight.  Take these medicines the morning of surgery with A SIP OF WATER    -  LORAZEPAM IF NEEDED, VENLAFAXINE (EFFEXOR)                                                                                                                               (STOP ASPIRIN OR ASPIRIN PRODUCTS, IBUPROFEN/ ADVIL/ MOTRIN, GOODY POWDERS, BC'S, HERBAL MEDICINES)  Do not wear jewelry, make-up or nail polish.  Do not wear lotions, powders, or perfumes, or deoderant.  Do not shave 48 hours prior to surgery.  Men may shave face and neck.  Do not bring valuables to the hospital.  [facility] is not responsible for any belongings or valuables.  Contacts, dentures or bridgework may not be worn into surgery.  Leave your suitcase in the car.  After surgery it may be brought to your room.  For patients admitted to the hospital, discharge time will be determined by your treatment team.  Patients discharged the day of surgery will not be allowed to drive home.   Name and phone number of your driver:    Special instructions:  [facility] - Preparing for Surgery  Before surgery, you can play an important role.  Because skin is not sterile, your skin needs to be as free of germs as possible.  You can reduce the number of germs on you skin by washing with CHG (chlorahexidine gluconate) soap before surgery.  CHG is an antiseptic cleaner which kills germs and bonds with the skin to continue killing germs even after washing.  Please DO NOT use if you have an allergy to CHG or antibacterial soaps.  If your skin  becomes reddened/irritated stop using the CHG and inform your nurse when you arrive at Short Stay.  Do not shave (including legs and underarms) for at least 48 hours prior to the first CHG shower.  You may shave your face.  Please follow these instructions carefully:   1.  Shower with CHG Soap the night before surgery and the                                morning of Surgery.  2.  If you choose to wash your hair, wash your hair first as usual with your       normal shampoo.  3.  After you shampoo, rinse your hair and body thoroughly to  remove the                      Shampoo.  4.  Use CHG as you would any other liquid soap.  You can apply chg directly       to the skin and wash gently with scrungie or a clean washcloth.  5.  Apply the CHG Soap to your body ONLY FROM THE NECK DOWN.        Do not use on open wounds or open sores.  Avoid contact with your eyes,       ears, mouth and genitals (private parts).  Wash genitals (private parts)       with your normal soap.  6.  Wash thoroughly, paying special attention to the area where your surgery        will be performed.  7.  Thoroughly rinse your body with warm water from the neck down.  8.  DO NOT shower/wash with your normal soap after using and rinsing off       the CHG Soap.  9.  Pat yourself dry with a clean towel.            10.  Wear clean pajamas.            11.  Place clean sheets on your bed the night of your first shower and do not        sleep with pets.  Day of Surgery  Do not apply any lotions/deoderants the morning of surgery.  Please wear clean clothes to the hospital/surgery center.    Please read over the following fact sheets that you were given. Pain Booklet

## 2016-12-10 ENCOUNTER — Encounter (HOSPITAL_COMMUNITY)
Admission: RE | Admit: 2016-12-10 | Discharge: 2016-12-10 | Disposition: A | Discharge status: Home / Self Care | Payer: 59 | Source: Ambulatory Visit | Attending: General Surgery | Admitting: General Surgery

## 2016-12-10 ENCOUNTER — Encounter (HOSPITAL_COMMUNITY): Payer: Self-pay

## 2016-12-10 DIAGNOSIS — Y838 Other surgical procedures as the cause of abnormal reaction of the patient, or of later complication, without mention of misadventure at the time of the procedure: Secondary | ICD-10-CM | POA: Diagnosis not present

## 2016-12-10 DIAGNOSIS — C50411 Malignant neoplasm of upper-outer quadrant of right female breast: Secondary | ICD-10-CM | POA: Diagnosis not present

## 2016-12-10 DIAGNOSIS — Z87891 Personal history of nicotine dependence: Secondary | ICD-10-CM | POA: Diagnosis not present

## 2016-12-10 DIAGNOSIS — Z882 Allergy status to sulfonamides status: Secondary | ICD-10-CM | POA: Diagnosis not present

## 2016-12-10 DIAGNOSIS — C50911 Malignant neoplasm of unspecified site of right female breast: Secondary | ICD-10-CM | POA: Diagnosis present

## 2016-12-10 DIAGNOSIS — L7634 Postprocedural seroma of skin and subcutaneous tissue following other procedure: Secondary | ICD-10-CM | POA: Diagnosis not present

## 2016-12-10 DIAGNOSIS — Z170 Estrogen receptor positive status [ER+]: Secondary | ICD-10-CM | POA: Diagnosis not present

## 2016-12-10 DIAGNOSIS — Z792 Long term (current) use of antibiotics: Secondary | ICD-10-CM | POA: Diagnosis not present

## 2016-12-10 HISTORY — DX: Personal history of urinary calculi: Z87.442

## 2016-12-10 HISTORY — DX: Anxiety disorder, unspecified: F41.9

## 2016-12-10 LAB — CBC
HCT: 38.3 % (ref 36.0–46.0)
Hemoglobin: 12.6 g/dL (ref 12.0–15.0)
MCH: 31.1 pg (ref 26.0–34.0)
MCHC: 32.9 g/dL (ref 30.0–36.0)
MCV: 94.6 fL (ref 78.0–100.0)
Platelets: 293 10*3/uL (ref 150–400)
RBC: 4.05 MIL/uL (ref 3.87–5.11)
RDW: 12.6 % (ref 11.5–15.5)
WBC: 6 10*3/uL (ref 4.0–10.5)

## 2016-12-10 LAB — BASIC METABOLIC PANEL
Anion gap: 11 (ref 5–15)
BUN: 7 mg/dL (ref 6–20)
CHLORIDE: 101 mmol/L (ref 101–111)
CO2: 27 mmol/L (ref 22–32)
CREATININE: 0.82 mg/dL (ref 0.44–1.00)
Calcium: 9.2 mg/dL (ref 8.9–10.3)
GFR calc non Af Amer: >60 mL/min (ref >60)
Glucose, Bld: 95 mg/dL (ref 65–99)
POTASSIUM: 3.6 mmol/L (ref 3.5–5.1)
SODIUM: 139 mmol/L (ref 135–145)

## 2016-12-10 LAB — HCG, SERUM, QUALITATIVE: PREG SERUM: NEGATIVE

## 2016-12-10 MED ORDER — GABAPENTIN 300 MG PO CAPS
300.0000 mg | ORAL_CAPSULE | ORAL | Status: AC
  Administered 2016-12-13: 300 mg via ORAL
  Filled 2016-12-10: qty 1

## 2016-12-10 MED ORDER — ACETAMINOPHEN 500 MG PO TABS
1000.0000 mg | ORAL_TABLET | ORAL | Status: AC
  Administered 2016-12-13: 1000 mg via ORAL
  Filled 2016-12-10: qty 2

## 2016-12-10 MED ORDER — CEFAZOLIN SODIUM-DEXTROSE 2-4 GM/100ML-% IV SOLN
2.0000 g | INTRAVENOUS | Status: AC
  Administered 2016-12-13: 2 g via INTRAVENOUS
  Filled 2016-12-10: qty 100

## 2016-12-13 ENCOUNTER — Encounter (HOSPITAL_COMMUNITY)
Admission: RE | Disposition: A | Discharge status: Home / Self Care | Payer: Self-pay | Source: Ambulatory Visit | Attending: General Surgery

## 2016-12-13 ENCOUNTER — Ambulatory Visit (HOSPITAL_COMMUNITY): Payer: 59 | Admitting: Anesthesiology

## 2016-12-13 ENCOUNTER — Ambulatory Visit (HOSPITAL_COMMUNITY): Payer: 59

## 2016-12-13 ENCOUNTER — Ambulatory Visit (HOSPITAL_COMMUNITY)
Admission: RE | Admit: 2016-12-13 | Discharge: 2016-12-13 | Disposition: A | Discharge status: Home / Self Care | Payer: 59 | Source: Ambulatory Visit | Attending: General Surgery | Admitting: General Surgery

## 2016-12-13 ENCOUNTER — Encounter (HOSPITAL_COMMUNITY): Payer: Self-pay

## 2016-12-13 DIAGNOSIS — Z792 Long term (current) use of antibiotics: Secondary | ICD-10-CM | POA: Insufficient documentation

## 2016-12-13 DIAGNOSIS — C50211 Malignant neoplasm of upper-inner quadrant of right female breast: Secondary | ICD-10-CM | POA: Diagnosis not present

## 2016-12-13 DIAGNOSIS — Z882 Allergy status to sulfonamides status: Secondary | ICD-10-CM | POA: Diagnosis not present

## 2016-12-13 DIAGNOSIS — Z170 Estrogen receptor positive status [ER+]: Secondary | ICD-10-CM | POA: Insufficient documentation

## 2016-12-13 DIAGNOSIS — Z09 Encounter for follow-up examination after completed treatment for conditions other than malignant neoplasm: Secondary | ICD-10-CM

## 2016-12-13 DIAGNOSIS — L7634 Postprocedural seroma of skin and subcutaneous tissue following other procedure: Secondary | ICD-10-CM | POA: Insufficient documentation

## 2016-12-13 DIAGNOSIS — R0602 Shortness of breath: Secondary | ICD-10-CM | POA: Diagnosis not present

## 2016-12-13 DIAGNOSIS — Z95828 Presence of other vascular implants and grafts: Secondary | ICD-10-CM

## 2016-12-13 DIAGNOSIS — C50411 Malignant neoplasm of upper-outer quadrant of right female breast: Secondary | ICD-10-CM | POA: Diagnosis not present

## 2016-12-13 DIAGNOSIS — Z87891 Personal history of nicotine dependence: Secondary | ICD-10-CM | POA: Insufficient documentation

## 2016-12-13 DIAGNOSIS — Y838 Other surgical procedures as the cause of abnormal reaction of the patient, or of later complication, without mention of misadventure at the time of the procedure: Secondary | ICD-10-CM | POA: Insufficient documentation

## 2016-12-13 DIAGNOSIS — Z452 Encounter for adjustment and management of vascular access device: Secondary | ICD-10-CM | POA: Diagnosis not present

## 2016-12-13 HISTORY — PX: PORTACATH PLACEMENT: SHX2246

## 2016-12-13 SURGERY — INSERTION, TUNNELED CENTRAL VENOUS DEVICE, WITH PORT
Anesthesia: General | Site: Breast

## 2016-12-13 MED ORDER — ONDANSETRON HCL 4 MG/2ML IJ SOLN
INTRAMUSCULAR | Status: DC | PRN
  Administered 2016-12-13: 4 mg via INTRAVENOUS

## 2016-12-13 MED ORDER — FENTANYL CITRATE (PF) 100 MCG/2ML IJ SOLN
INTRAMUSCULAR | Status: DC | PRN
  Administered 2016-12-13: 25 ug via INTRAVENOUS
  Administered 2016-12-13: 50 ug via INTRAVENOUS
  Administered 2016-12-13: 25 ug via INTRAVENOUS

## 2016-12-13 MED ORDER — LACTATED RINGERS IV SOLN
INTRAVENOUS | Status: DC
  Administered 2016-12-13 (×2): via INTRAVENOUS

## 2016-12-13 MED ORDER — BUPIVACAINE HCL (PF) 0.25 % IJ SOLN
INTRAMUSCULAR | Status: AC
  Filled 2016-12-13: qty 10

## 2016-12-13 MED ORDER — CHLORHEXIDINE GLUCONATE CLOTH 2 % EX PADS: 6.0000 | MEDICATED_PAD | Freq: Once | CUTANEOUS | Status: DC

## 2016-12-13 MED ORDER — HYDROCODONE-ACETAMINOPHEN 5-325 MG PO TABS: 1.0000 | ORAL_TABLET | ORAL | 0 refills | Status: DC | PRN

## 2016-12-13 MED ORDER — FENTANYL CITRATE (PF) 100 MCG/2ML IJ SOLN
INTRAMUSCULAR | Status: AC
  Filled 2016-12-13: qty 2

## 2016-12-13 MED ORDER — 0.9 % SODIUM CHLORIDE (POUR BTL) OPTIME
TOPICAL | Status: DC | PRN
  Administered 2016-12-13: 1000 mL

## 2016-12-13 MED ORDER — BUPIVACAINE HCL (PF) 0.25 % IJ SOLN
INTRAMUSCULAR | Status: DC | PRN
  Administered 2016-12-13: 10 mL

## 2016-12-13 MED ORDER — LIDOCAINE HCL (CARDIAC) 20 MG/ML IV SOLN
INTRAVENOUS | Status: DC | PRN
  Administered 2016-12-13: 60 mg via INTRAVENOUS

## 2016-12-13 MED ORDER — MIDAZOLAM HCL 5 MG/5ML IJ SOLN
INTRAMUSCULAR | Status: DC | PRN
  Administered 2016-12-13: 2 mg via INTRAVENOUS

## 2016-12-13 MED ORDER — MIDAZOLAM HCL 2 MG/2ML IJ SOLN
INTRAMUSCULAR | Status: AC
  Filled 2016-12-13: qty 2

## 2016-12-13 MED ORDER — OXYCODONE HCL 5 MG PO TABS
5.0000 mg | ORAL_TABLET | Freq: Once | ORAL | Status: AC | PRN
  Administered 2016-12-13: 5 mg via ORAL

## 2016-12-13 MED ORDER — HEPARIN SOD (PORK) LOCK FLUSH 100 UNIT/ML IV SOLN
INTRAVENOUS | Status: AC
  Filled 2016-12-13: qty 5

## 2016-12-13 MED ORDER — SODIUM CHLORIDE 0.9 % IV SOLN
INTRAVENOUS | Status: DC | PRN
  Administered 2016-12-13: 500 mL

## 2016-12-13 MED ORDER — HEPARIN SOD (PORK) LOCK FLUSH 100 UNIT/ML IV SOLN
INTRAVENOUS | Status: DC | PRN
  Administered 2016-12-13: 500 [IU] via INTRAVENOUS

## 2016-12-13 MED ORDER — FENTANYL CITRATE (PF) 100 MCG/2ML IJ SOLN
25.0000 ug | INTRAMUSCULAR | Status: DC | PRN
  Administered 2016-12-13 (×2): 50 ug via INTRAVENOUS

## 2016-12-13 MED ORDER — OXYCODONE HCL 5 MG/5ML PO SOLN: 5.0000 mg | Freq: Once | ORAL | Status: AC | PRN

## 2016-12-13 MED ORDER — OXYCODONE HCL 5 MG PO TABS
ORAL_TABLET | ORAL | Status: AC
  Filled 2016-12-13: qty 1

## 2016-12-13 MED ORDER — FENTANYL CITRATE (PF) 100 MCG/2ML IJ SOLN: 25.0000 ug | INTRAMUSCULAR | Status: DC | PRN

## 2016-12-13 MED ORDER — PROPOFOL 10 MG/ML IV BOLUS
INTRAVENOUS | Status: DC | PRN
  Administered 2016-12-13: 160 mg via INTRAVENOUS
  Administered 2016-12-13: 40 mg via INTRAVENOUS

## 2016-12-13 SURGICAL SUPPLY — 52 items
BAG DECANTER FOR FLEXI CONT (MISCELLANEOUS) ×2 IMPLANT
BLADE SURG 15 STRL LF DISP TIS (BLADE) ×1 IMPLANT
BLADE SURG 15 STRL SS (BLADE) ×1
CHLORAPREP W/TINT 10.5 ML (MISCELLANEOUS) ×2 IMPLANT
COVER SURGICAL LIGHT HANDLE (MISCELLANEOUS) ×2 IMPLANT
COVER TRANSDUCER ULTRASND GEL (DRAPE) IMPLANT
CRADLE DONUT ADULT HEAD (MISCELLANEOUS) ×2 IMPLANT
DERMABOND ADVANCED (GAUZE/BANDAGES/DRESSINGS) ×1
DERMABOND ADVANCED .7 DNX12 (GAUZE/BANDAGES/DRESSINGS) ×1 IMPLANT
DRAPE C-ARM 42X72 X-RAY (DRAPES) ×2 IMPLANT
DRAPE CHEST BREAST 15X10 FENES (DRAPES) ×2 IMPLANT
DRAPE UTILITY XL STRL (DRAPES) ×4 IMPLANT
ELECT CAUTERY BLADE 6.4 (BLADE) ×2 IMPLANT
ELECT REM PT RETURN 9FT ADLT (ELECTROSURGICAL) ×2
ELECTRODE REM PT RTRN 9FT ADLT (ELECTROSURGICAL) ×1 IMPLANT
GAUZE SPONGE 4X4 16PLY XRAY LF (GAUZE/BANDAGES/DRESSINGS) ×2 IMPLANT
GEL ULTRASOUND 20GR AQUASONIC (MISCELLANEOUS) IMPLANT
GLOVE BIO SURGEON STRL SZ7.5 (GLOVE) ×2 IMPLANT
GLOVE BIOGEL PI IND STRL 6.5 (GLOVE) ×1 IMPLANT
GLOVE BIOGEL PI INDICATOR 6.5 (GLOVE) ×1
GLOVE ECLIPSE 6.5 STRL STRAW (GLOVE) ×2 IMPLANT
GLOVE SKINSENSE NS SZ7.0 (GLOVE) ×1
GLOVE SKINSENSE STRL SZ7.0 (GLOVE) ×1 IMPLANT
GOWN BRE IMP SLV AUR XL STRL (GOWN DISPOSABLE) ×2 IMPLANT
GOWN STRL REUS W/ TWL LRG LVL3 (GOWN DISPOSABLE) ×2 IMPLANT
GOWN STRL REUS W/TWL LRG LVL3 (GOWN DISPOSABLE) ×2
INTRODUCER COOK 11FR (CATHETERS) IMPLANT
KIT BASIN OR (CUSTOM PROCEDURE TRAY) ×2 IMPLANT
KIT PORT POWER 8FR ISP CVUE (Catheter) ×2 IMPLANT
KIT ROOM TURNOVER OR (KITS) ×2 IMPLANT
NEEDLE 22X1 1/2 (OR ONLY) (NEEDLE) IMPLANT
NEEDLE HYPO 25GX1X1/2 BEV (NEEDLE) ×2 IMPLANT
NS IRRIG 1000ML POUR BTL (IV SOLUTION) ×2 IMPLANT
PACK SURGICAL SETUP 50X90 (CUSTOM PROCEDURE TRAY) ×2 IMPLANT
PAD ARMBOARD 7.5X6 YLW CONV (MISCELLANEOUS) ×2 IMPLANT
PENCIL BUTTON HOLSTER BLD 10FT (ELECTRODE) ×2 IMPLANT
SET INTRODUCER 12FR PACEMAKER (SHEATH) IMPLANT
SET SHEATH INTRODUCER 10FR (MISCELLANEOUS) IMPLANT
SHEATH COOK PEEL AWAY SET 9F (SHEATH) IMPLANT
SUT MNCRL AB 4-0 PS2 18 (SUTURE) ×2 IMPLANT
SUT PROLENE 2 0 SH 30 (SUTURE) ×4 IMPLANT
SUT SILK 2 0 (SUTURE)
SUT SILK 2-0 18XBRD TIE 12 (SUTURE) IMPLANT
SUT VIC AB 3-0 SH 27 (SUTURE) ×1
SUT VIC AB 3-0 SH 27XBRD (SUTURE) ×1 IMPLANT
SYR 10ML LL (SYRINGE) IMPLANT
SYR 20ML ECCENTRIC (SYRINGE) ×4 IMPLANT
SYR 5ML LUER SLIP (SYRINGE) ×2 IMPLANT
SYR CONTROL 10ML LL (SYRINGE) ×2 IMPLANT
TOWEL OR 17X24 6PK STRL BLUE (TOWEL DISPOSABLE) ×2 IMPLANT
TOWEL OR 17X26 10 PK STRL BLUE (TOWEL DISPOSABLE) ×2 IMPLANT
TUBING 1/4  WITH 7/8  ADAPTER (MISCELLANEOUS) ×2 IMPLANT

## 2016-12-13 NOTE — Transfer of Care (Signed)
Immediate Anesthesia Transfer of Care Note  Patient: Lisa Anthony  Procedure(s) Performed: Procedure(s): INSERTION PORT-A-CATH (N/A)  Patient Location: PACU  Anesthesia Type:General  Level of Consciousness: awake, alert  and oriented  Airway & Oxygen Therapy: Patient Spontanous Breathing and Patient connected to nasal cannula oxygen  Post-op Assessment: Report given to RN and Post -op Vital signs reviewed and stable  Post vital signs: Reviewed and stable  Last Vitals:  Vitals:   12/13/16 1152  BP: 136/90  Pulse: 81  Resp: 18  Temp: 37 C    Last Pain:  Vitals:   12/13/16 1152  TempSrc: Oral      Patients Stated Pain Goal: 3 (02/72/53 6644)  Complications: No apparent anesthesia complications

## 2016-12-13 NOTE — Anesthesia Procedure Notes (Signed)
Procedure Name: LMA Insertion Date/Time: 12/13/2016 2:22 PM Performed by: Clearnce Sorrel Pre-anesthesia Checklist: Patient identified, Emergency Drugs available, Suction available, Patient being monitored and Timeout performed Patient Re-evaluated:Patient Re-evaluated prior to inductionOxygen Delivery Method: Circle system utilized Preoxygenation: Pre-oxygenation with 100% oxygen Intubation Type: IV induction LMA: LMA inserted LMA Size: 4.0 Number of attempts: 1 Placement Confirmation: positive ETCO2 and breath sounds checked- equal and bilateral Tube secured with: Tape Dental Injury: Teeth and Oropharynx as per pre-operative assessment

## 2016-12-13 NOTE — Interval H&P Note (Signed)
History and Physical Interval Note:  12/13/2016 1:55 PM  Lisa Anthony  has presented today for surgery, with the diagnosis of RIGHT BREAST CANCER  The various methods of treatment have been discussed with the patient and family. After consideration of risks, benefits and other options for treatment, the patient has consented to  Procedure(s): INSERTION PORT-A-CATH (N/A) as a surgical intervention .  The patient's history has been reviewed, patient examined, no change in status, stable for surgery.  I have reviewed the patient's chart and labs.  Questions were answered to the patient's satisfaction.     TOTH III,PAUL S

## 2016-12-13 NOTE — Anesthesia Preprocedure Evaluation (Addendum)
Anesthesia Evaluation  Patient identified by MRN, date of birth, ID band Patient awake    Reviewed: Allergy & Precautions, H&P , NPO status , Patient's Chart, lab work & pertinent test results  History of Anesthesia Complications Negative for: history of anesthetic complications  Airway Mallampati: II  TM Distance: >3 FB Neck ROM: full    Dental  (+) Teeth Intact   Pulmonary neg shortness of breath, neg sleep apnea, neg COPD, neg recent URI, former smoker,    breath sounds clear to auscultation       Cardiovascular negative cardio ROS   Rhythm:regular Rate:Normal     Neuro/Psych PSYCHIATRIC DISORDERS Anxiety negative neurological ROS     GI/Hepatic negative GI ROS, Neg liver ROS,   Endo/Other  negative endocrine ROS  Renal/GU negative Renal ROS     Musculoskeletal   Abdominal   Peds  Hematology negative hematology ROS (+)   Anesthesia Other Findings   Reproductive/Obstetrics Breast CA                            Anesthesia Physical Anesthesia Plan  ASA: II  Anesthesia Plan: General   Post-op Pain Management:    Induction: Intravenous  Airway Management Planned: Oral ETT and LMA  Additional Equipment: None  Intra-op Plan:   Post-operative Plan: Extubation in OR  Informed Consent: I have reviewed the patients History and Physical, chart, labs and discussed the procedure including the risks, benefits and alternatives for the proposed anesthesia with the patient or authorized representative who has indicated his/her understanding and acceptance.   Dental advisory given  Plan Discussed with: CRNA and Surgeon  Anesthesia Plan Comments:        Anesthesia Quick Evaluation

## 2016-12-13 NOTE — Op Note (Signed)
12/13/2016  2:57 PM  PATIENT:  Lisa Anthony  51 y.o. female  PRE-OPERATIVE DIAGNOSIS:  RIGHT BREAST CANCER  POST-OPERATIVE DIAGNOSIS:  right breast cancer  PROCEDURE:  Procedure(s): INSERTION PORT-A-CATH (N/A)  SURGEON:  Surgeon(s) and Role:    * Jovita Kussmaul, MD - Primary  PHYSICIAN ASSISTANT:   ASSISTANTS: none   ANESTHESIA:   local and general  EBL:  Total I/O In: 500 [I.V.:500] Out: 2 [Blood:2]  BLOOD ADMINISTERED:none  DRAINS: none   LOCAL MEDICATIONS USED:  MARCAINE     SPECIMEN:  No Specimen  DISPOSITION OF SPECIMEN:  N/A  COUNTS:  YES  TOURNIQUET:  * No tourniquets in log *  DICTATION: .Dragon Dictation   After informed consent was obtained the patient was brought to the operating room and placed in the supine position on the operating room table. After adequate induction of general anesthesia a roll was placed between the patient's shoulder blades to extend the shoulder slightly. The left chest and neck area were then prepped with ChloraPrep, allowed to dry, and draped in usual sterile manner. An appropriate timeout was performed. The patient was placed in Trendelenburg position. A large-bore needle from the Port-A-Cath kit was then used to slide the knee bend of the clavicle and the left chest wall heading towards the sternal notch and in doing so I was unable to access the left subclavian vein without difficulty. A wire was fed through the needle using the Seldinger technique without difficulty. The wire was confirmed in the central venous system using real-time fluoroscopy. Next a small incision was made at the wire entry site on the left chest wall with a 15 blade knife. A subcutaneous pocket was created inferior to this incision by blunt finger dissection and some sharp dissection with electrocautery. Next the tubing was placed on the reservoir. The reservoir was placed in the pocket and the length of the tubing was estimated again using real-time  fluoroscopy. The tubing was cut to the appropriate length. Sheath and dilator were fed over the wire also using the Seldinger technique without difficulty. The dilator and wire were removed. The tubing was fed through the sheath as far as it would go and then held in place while the sheath was gently cracked and separated. Another real-time fluoroscopy image showed the tip of the catheter to be in the distal superior vena cava. The tubing was then permanently anchored to the reservoir. The reservoir was anchored in the pocket with 2 2-0 Prolene stitches. The port was then aspirated and aspirated blood easily. The port was then flushed initially with a dilute heparin solution and then with a more concentrated heparin solution. The subcutaneous tissue was then closed over the port with interrupted 3-0 Vicryl stitches. Skin was then closed with a running 4-0 Monocryl subcuticular stitch. Dermabond dressings were applied. The patient tolerated the procedure well. At the end of the case all needle spongecounts were correct. Patient was then awakened and taken to recovery in stable condition.  PLAN OF CARE: Discharge to home after PACU  PATIENT DISPOSITION:  PACU - hemodynamically stable.   Delay start of Pharmacological VTE agent (>24hrs) due to surgical blood loss or risk of bleeding: not applicable

## 2016-12-13 NOTE — H&P (Signed)
  Lisa Anthony  Location: [facility] Patient #: [id number] DOB: [birth year] Single / Language: Cleophus Molt / Race: White Female   History of Present Illness  The patient is a 51 year old female who presents for a follow-up for Breast cancer. The patient is a 51 year old white female who is about 3 weeks status post right breast lumpectomy and sentinel node mapping for a T1b N0 right breast cancer. She was ER/PR positive and HER-2/neu negative with a Ki-67 of 30%. She tolerated the surgery well. Her only complaint is of some swelling and soreness in the right axilla that has come up over the last couple days. She denies any fevers or chills.   Allergies  Sulfacetamide *CHEMICALS*  Hives Allergies Reconciled   Medication History Solodyn (80MG Tablet ER 24HR, Oral twice a week) Active. Doxylamine Succinate (Sleep) (25MG Tablet, Oral daily) Active. Medications Reconciled  Vitals  Weight: 147.6 lb Height: 66in Body Surface Area: 1.76 m Body Mass Index: 23.82 kg/m  Temp.: 98.47F  Pulse: 87 (Regular)  BP: 128/78 (Sitting, Left Arm, Standard)       Physical Exam  Breast Note: The right breast periareolar incision is healing nicely with no sign of infection or seroma. The right axillary incision is also healing well with no sign of infection but she does have a small seroma. This area was prepped with ChloraPrep and infiltrated with 1% lidocaine. I was able to aspirate about 40 cc of serous fluid and she tolerated this well.     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 She tolerated the surgery well. She has developed a small seroma in the right axilla which I was able to aspirate today and she tolerated this well. At this point I will plan to see her back in about 2 weeks  to check for any more fluid. She will continue to follow up with medical and radiation oncology for adjuvant therapy. Current Plans Follow up with [place] in the office in 2 weeks.  Call us sooner as needed. Pt will need chemotherapy. For port today. Risks and benefits of the port were discussed in detail with the patient and she understands and wishes to proceed

## 2016-12-14 ENCOUNTER — Encounter (HOSPITAL_COMMUNITY): Payer: Self-pay | Admitting: General Surgery

## 2016-12-16 ENCOUNTER — Telehealth: Payer: Self-pay | Admitting: *Deleted

## 2016-12-16 NOTE — Telephone Encounter (Signed)
  Oncology Nurse Navigator Documentation  Navigator Location: [facility] (12/16/16 0900)   )Navigator Encounter Type: Telephone (12/16/16 0900) Telephone: Incoming Call;Medication Assistance (12/16/16 0900)                                                  Time Spent with Patient: 15 (12/16/16 0900)

## 2016-12-17 NOTE — Anesthesia Postprocedure Evaluation (Addendum)
Anesthesia Post Note  Patient: Lisa Anthony  Procedure(s) Performed: Procedure(s) (LRB): INSERTION PORT-A-CATH (N/A)  Patient location during evaluation: PACU Anesthesia Type: General Level of consciousness: awake and alert Pain management: pain level controlled Vital Signs Assessment: post-procedure vital signs reviewed and stable Respiratory status: spontaneous breathing, nonlabored ventilation, respiratory function stable and patient connected to nasal cannula oxygen Cardiovascular status: blood pressure returned to baseline and stable Postop Assessment: no signs of nausea or vomiting Anesthetic complications: no       Last Vitals:  Vitals:   12/13/16 1622 12/13/16 1630  BP: 115/88   Pulse: 70 80  Resp: 20 16  Temp:  36.7 C    Last Pain:  Vitals:   12/13/16 1606  TempSrc:   PainSc: 4                  Arloa Prak

## 2016-12-21 ENCOUNTER — Ambulatory Visit: Payer: 59

## 2016-12-21 ENCOUNTER — Encounter: Payer: Self-pay | Admitting: *Deleted

## 2016-12-21 ENCOUNTER — Other Ambulatory Visit (HOSPITAL_BASED_OUTPATIENT_CLINIC_OR_DEPARTMENT_OTHER): Payer: 59

## 2016-12-21 ENCOUNTER — Ambulatory Visit (HOSPITAL_BASED_OUTPATIENT_CLINIC_OR_DEPARTMENT_OTHER): Payer: 59

## 2016-12-21 VITALS — BP 131/79 | HR 85 | Temp 99.5°F | Resp 16

## 2016-12-21 DIAGNOSIS — C50211 Malignant neoplasm of upper-inner quadrant of right female breast: Secondary | ICD-10-CM

## 2016-12-21 DIAGNOSIS — Z5111 Encounter for antineoplastic chemotherapy: Secondary | ICD-10-CM

## 2016-12-21 DIAGNOSIS — Z5189 Encounter for other specified aftercare: Secondary | ICD-10-CM | POA: Diagnosis not present

## 2016-12-21 DIAGNOSIS — Z95828 Presence of other vascular implants and grafts: Secondary | ICD-10-CM | POA: Insufficient documentation

## 2016-12-21 DIAGNOSIS — Z170 Estrogen receptor positive status [ER+]: Principal | ICD-10-CM

## 2016-12-21 LAB — CBC WITH DIFFERENTIAL/PLATELET
BASO%: 0.9 % (ref 0.0–2.0)
Basophils Absolute: 0.1 10*3/uL (ref 0.0–0.1)
EOS ABS: 0 10*3/uL (ref 0.0–0.5)
EOS%: 0.6 % (ref 0.0–7.0)
HCT: 35.9 % (ref 34.8–46.6)
HGB: 12.5 g/dL (ref 11.6–15.9)
LYMPH%: 27.3 % (ref 14.0–49.7)
MCH: 32.3 pg (ref 25.1–34.0)
MCHC: 34.8 g/dL (ref 31.5–36.0)
MCV: 92.9 fL (ref 79.5–101.0)
MONO#: 0.7 10*3/uL (ref 0.1–0.9)
MONO%: 8.4 % (ref 0.0–14.0)
NEUT#: 4.9 10*3/uL (ref 1.5–6.5)
NEUT%: 62.8 % (ref 38.4–76.8)
PLATELETS: 253 10*3/uL (ref 145–400)
RBC: 3.87 10*6/uL (ref 3.70–5.45)
RDW: 12.7 % (ref 11.2–14.5)
WBC: 7.8 10*3/uL (ref 3.9–10.3)
lymph#: 2.1 10*3/uL (ref 0.9–3.3)

## 2016-12-21 LAB — COMPREHENSIVE METABOLIC PANEL
ALBUMIN: 4.1 g/dL (ref 3.5–5.0)
ALK PHOS: 69 U/L (ref 40–150)
ALT: 22 U/L (ref 0–55)
AST: 20 U/L (ref 5–34)
Anion Gap: 13 mEq/L — ABNORMAL HIGH (ref 3–11)
BILIRUBIN TOTAL: 0.87 mg/dL (ref 0.20–1.20)
BUN: 10.6 mg/dL (ref 7.0–26.0)
CO2: 21 mEq/L — ABNORMAL LOW (ref 22–29)
CREATININE: 0.8 mg/dL (ref 0.6–1.1)
Calcium: 9.4 mg/dL (ref 8.4–10.4)
Chloride: 103 mEq/L (ref 98–109)
EGFR: 85 mL/min/{1.73_m2} — AB (ref >90)
GLUCOSE: 131 mg/dL (ref 70–140)
Potassium: 3.4 mEq/L — ABNORMAL LOW (ref 3.5–5.1)
SODIUM: 138 meq/L (ref 136–145)
TOTAL PROTEIN: 7 g/dL (ref 6.4–8.3)

## 2016-12-21 MED ORDER — PALONOSETRON HCL INJECTION 0.25 MG/5ML
0.2500 mg | Freq: Once | INTRAVENOUS | Status: AC
  Administered 2016-12-21: 0.25 mg via INTRAVENOUS

## 2016-12-21 MED ORDER — PALONOSETRON HCL INJECTION 0.25 MG/5ML
INTRAVENOUS | Status: AC
  Filled 2016-12-21: qty 5

## 2016-12-21 MED ORDER — HEPARIN SOD (PORK) LOCK FLUSH 100 UNIT/ML IV SOLN
500.0000 [IU] | Freq: Once | INTRAVENOUS | Status: AC | PRN
  Administered 2016-12-21: 500 [IU]
  Filled 2016-12-21: qty 5

## 2016-12-21 MED ORDER — SODIUM CHLORIDE 0.9% FLUSH
10.0000 mL | INTRAVENOUS | Status: DC | PRN
  Administered 2016-12-21: 10 mL via INTRAVENOUS
  Filled 2016-12-21: qty 10

## 2016-12-21 MED ORDER — DEXAMETHASONE SODIUM PHOSPHATE 10 MG/ML IJ SOLN
10.0000 mg | Freq: Once | INTRAMUSCULAR | Status: AC
  Administered 2016-12-21: 10 mg via INTRAVENOUS

## 2016-12-21 MED ORDER — SODIUM CHLORIDE 0.9% FLUSH
10.0000 mL | INTRAVENOUS | Status: DC | PRN
  Administered 2016-12-21: 10 mL
  Filled 2016-12-21: qty 10

## 2016-12-21 MED ORDER — DEXAMETHASONE SODIUM PHOSPHATE 10 MG/ML IJ SOLN
INTRAMUSCULAR | Status: AC
  Filled 2016-12-21: qty 1

## 2016-12-21 MED ORDER — DOCETAXEL CHEMO INJECTION 160 MG/16ML
75.0000 mg/m2 | Freq: Once | INTRAVENOUS | Status: AC
  Administered 2016-12-21: 130 mg via INTRAVENOUS
  Filled 2016-12-21: qty 13

## 2016-12-21 MED ORDER — PEGFILGRASTIM 6 MG/0.6ML ~~LOC~~ PSKT
6.0000 mg | PREFILLED_SYRINGE | Freq: Once | SUBCUTANEOUS | Status: AC
  Administered 2016-12-21: 6 mg via SUBCUTANEOUS
  Filled 2016-12-21: qty 0.6

## 2016-12-21 MED ORDER — SODIUM CHLORIDE 0.9 % IV SOLN
600.0000 mg/m2 | Freq: Once | INTRAVENOUS | Status: AC
  Administered 2016-12-21: 1060 mg via INTRAVENOUS
  Filled 2016-12-21: qty 53

## 2016-12-21 MED ORDER — SODIUM CHLORIDE 0.9 % IV SOLN
Freq: Once | INTRAVENOUS | Status: AC
  Administered 2016-12-21: 10:00:00 via INTRAVENOUS

## 2016-12-21 NOTE — Patient Instructions (Addendum)
[facility] Discharge Instructions for Patients Receiving Chemotherapy  Today you received the following chemotherapy agents Taxotere and Cytoxan   To help prevent nausea and vomiting after your treatment, we encourage you to take your nausea medication as directed. No Zofran for 3 days. Take Compazine instead.    If you develop nausea and vomiting that is not controlled by your nausea medication, call the clinic.   BELOW ARE SYMPTOMS THAT SHOULD BE REPORTED IMMEDIATELY:  *FEVER GREATER THAN 100.5 F  *CHILLS WITH OR WITHOUT FEVER  NAUSEA AND VOMITING THAT IS NOT CONTROLLED WITH YOUR NAUSEA MEDICATION  *UNUSUAL SHORTNESS OF BREATH  *UNUSUAL BRUISING OR BLEEDING  TENDERNESS IN MOUTH AND THROAT WITH OR WITHOUT PRESENCE OF ULCERS  *URINARY PROBLEMS  *BOWEL PROBLEMS  UNUSUAL RASH Items with * indicate a potential emergency and should be followed up as soon as possible.  Feel free to call the clinic you have any questions or concerns. The clinic phone number is [phone].  Please show the [facility] at check-in to the Emergency Department and triage nurse.  Docetaxel injection What is this medicine? DOCETAXEL (doe se TAX el) is a chemotherapy drug. It targets fast dividing cells, like cancer cells, and causes these cells to die. This medicine is used to treat many types of cancers like breast cancer, certain stomach cancers, head and neck cancer, lung cancer, and prostate cancer. This medicine may be used for other purposes; ask your health care provider or pharmacist if you have questions. COMMON BRAND NAME(S): Docefrez, Taxotere What should I tell my health care provider before I take this medicine? They need to know if you have any of these conditions: -infection (especially a virus infection such as chickenpox, cold sores, or herpes) -liver disease -low blood counts, like low white cell, platelet, or red cell counts -an unusual or allergic reaction  to docetaxel, polysorbate 80, other chemotherapy agents, other medicines, foods, dyes, or preservatives -pregnant or trying to get pregnant -breast-feeding How should I use this medicine? This drug is given as an infusion into a vein. It is administered in a hospital or clinic by a specially trained health care professional. Talk to your pediatrician regarding the use of this medicine in children. Special care may be needed. Overdosage: If you think you have taken too much of this medicine contact a poison control center or emergency room at once. NOTE: This medicine is only for you. Do not share this medicine with others. What if I miss a dose? It is important not to miss your dose. Call your doctor or health care professional if you are unable to keep an appointment. What may interact with this medicine? -cyclosporine -erythromycin -ketoconazole -medicines to increase blood counts like filgrastim, pegfilgrastim, sargramostim -vaccines Talk to your doctor or health care professional before taking any of these medicines: -acetaminophen -aspirin -ibuprofen -ketoprofen -naproxen This list may not describe all possible interactions. Give your health care provider a list of all the medicines, herbs, non-prescription drugs, or dietary supplements you use. Also tell them if you smoke, drink alcohol, or use illegal drugs. Some items may interact with your medicine. What should I watch for while using this medicine? Your condition will be monitored carefully while you are receiving this medicine. You will need important blood work done while you are taking this medicine. This drug may make you feel generally unwell. This is not uncommon, as chemotherapy can affect healthy cells as well as cancer cells. Report any side effects.  Continue your course of treatment even though you feel ill unless your doctor tells you to stop. In some cases, you may be given additional medicines to help with side  effects. Follow all directions for their use. Call your doctor or health care professional for advice if you get a fever, chills or sore throat, or other symptoms of a cold or flu. Do not treat yourself. This drug decreases your body's ability to fight infections. Try to avoid being around people who are sick. This medicine may increase your risk to bruise or bleed. Call your doctor or health care professional if you notice any unusual bleeding. This medicine may contain alcohol in the product. You may get drowsy or dizzy. Do not drive, use machinery, or do anything that needs mental alertness until you know how this medicine affects you. Do not stand or sit up quickly, especially if you are an older patient. This reduces the risk of dizzy or fainting spells. Avoid alcoholic drinks. Do not become pregnant while taking this medicine. Women should inform their doctor if they wish to become pregnant or think they might be pregnant. There is a potential for serious side effects to an unborn child. Talk to your health care professional or pharmacist for more information. Do not breast-feed an infant while taking this medicine. What side effects may I notice from receiving this medicine? Side effects that you should report to your doctor or health care professional as soon as possible: -allergic reactions like skin rash, itching or hives, swelling of the face, lips, or tongue -low blood counts - This drug may decrease the number of white blood cells, red blood cells and platelets. You may be at increased risk for infections and bleeding. -signs of infection - fever or chills, cough, sore throat, pain or difficulty passing urine -signs of decreased platelets or bleeding - bruising, pinpoint red spots on the skin, black, tarry stools, nosebleeds -signs of decreased red blood cells - unusually weak or tired, fainting spells, lightheadedness -breathing problems -fast or irregular heartbeat -low blood  pressure -mouth sores -nausea and vomiting -pain, swelling, redness or irritation at the injection site -pain, tingling, numbness in the hands or feet -swelling of the ankle, feet, hands -weight gain Side effects that usually do not require medical attention (report to your doctor or health care professional if they continue or are bothersome): -bone pain -complete hair loss including hair on your head, underarms, pubic hair, eyebrows, and eyelashes -diarrhea -excessive tearing -changes in the color of fingernails -loosening of the fingernails -nausea -muscle pain -red flush to skin -sweating -weak or tired This list may not describe all possible side effects. Call your doctor for medical advice about side effects. You may report side effects to FDA at 1-800-FDA-1088. Where should I keep my medicine? This drug is given in a hospital or clinic and will not be stored at home. NOTE: This sheet is a summary. It may not cover all possible information. If you have questions about this medicine, talk to your doctor, pharmacist, or health care provider.  2018 Elsevier/Gold Standard (2015-10-09 12:32:56)  Cyclophosphamide injection What is this medicine? CYCLOPHOSPHAMIDE (sye kloe FOSS fa mide) is a chemotherapy drug. It slows the growth of cancer cells. This medicine is used to treat many types of cancer like lymphoma, myeloma, leukemia, breast cancer, and ovarian cancer, to name a few. This medicine may be used for other purposes; ask your health care provider or pharmacist if you have questions. COMMON BRAND NAME(S):  Cytoxan, Neosar What should I tell my health care provider before I take this medicine? They need to know if you have any of these conditions: -blood disorders -history of other chemotherapy -infection -kidney disease -liver disease -recent or ongoing radiation therapy -tumors in the bone marrow -an unusual or allergic reaction to cyclophosphamide, other chemotherapy,  other medicines, foods, dyes, or preservatives -pregnant or trying to get pregnant -breast-feeding How should I use this medicine? This drug is usually given as an injection into a vein or muscle or by infusion into a vein. It is administered in a hospital or clinic by a specially trained health care professional. Talk to your pediatrician regarding the use of this medicine in children. Special care may be needed. Overdosage: If you think you have taken too much of this medicine contact a poison control center or emergency room at once. NOTE: This medicine is only for you. Do not share this medicine with others. What if I miss a dose? It is important not to miss your dose. Call your doctor or health care professional if you are unable to keep an appointment. What may interact with this medicine? This medicine may interact with the following medications: -amiodarone -amphotericin B -azathioprine -certain antiviral medicines for HIV or AIDS such as protease inhibitors (e.g., indinavir, ritonavir) and zidovudine -certain blood pressure medications such as benazepril, captopril, enalapril, fosinopril, lisinopril, moexipril, monopril, perindopril, quinapril, ramipril, trandolapril -certain cancer medications such as anthracyclines (e.g., daunorubicin, doxorubicin), busulfan, cytarabine, paclitaxel, pentostatin, tamoxifen, trastuzumab -certain diuretics such as chlorothiazide, chlorthalidone, hydrochlorothiazide, indapamide, metolazone -certain medicines that treat or prevent blood clots like warfarin -certain muscle relaxants such as succinylcholine -cyclosporine -etanercept -indomethacin -medicines to increase blood counts like filgrastim, pegfilgrastim, sargramostim -medicines used as general anesthesia -metronidazole -natalizumab This list may not describe all possible interactions. Give your health care provider a list of all the medicines, herbs, non-prescription drugs, or dietary  supplements you use. Also tell them if you smoke, drink alcohol, or use illegal drugs. Some items may interact with your medicine. What should I watch for while using this medicine? Visit your doctor for checks on your progress. This drug may make you feel generally unwell. This is not uncommon, as chemotherapy can affect healthy cells as well as cancer cells. Report any side effects. Continue your course of treatment even though you feel ill unless your doctor tells you to stop. Drink water or other fluids as directed. Urinate often, even at night. In some cases, you may be given additional medicines to help with side effects. Follow all directions for their use. Call your doctor or health care professional for advice if you get a fever, chills or sore throat, or other symptoms of a cold or flu. Do not treat yourself. This drug decreases your body's ability to fight infections. Try to avoid being around people who are sick. This medicine may increase your risk to bruise or bleed. Call your doctor or health care professional if you notice any unusual bleeding. Be careful brushing and flossing your teeth or using a toothpick because you may get an infection or bleed more easily. If you have any dental work done, tell your dentist you are receiving this medicine. You may get drowsy or dizzy. Do not drive, use machinery, or do anything that needs mental alertness until you know how this medicine affects you. Do not become pregnant while taking this medicine or for 1 year after stopping it. Women should inform their doctor if they wish to become  pregnant or think they might be pregnant. Men should not father a child while taking this medicine and for 4 months after stopping it. There is a potential for serious side effects to an unborn child. Talk to your health care professional or pharmacist for more information. Do not breast-feed an infant while taking this medicine. This medicine may interfere with the  ability to have a child. This medicine has caused ovarian failure in some women. This medicine has caused reduced sperm counts in some men. You should talk with your doctor or health care professional if you are concerned about your fertility. If you are going to have surgery, tell your doctor or health care professional that you have taken this medicine. What side effects may I notice from receiving this medicine? Side effects that you should report to your doctor or health care professional as soon as possible: -allergic reactions like skin rash, itching or hives, swelling of the face, lips, or tongue -low blood counts - this medicine may decrease the number of white blood cells, red blood cells and platelets. You may be at increased risk for infections and bleeding. -signs of infection - fever or chills, cough, sore throat, pain or difficulty passing urine -signs of decreased platelets or bleeding - bruising, pinpoint red spots on the skin, black, tarry stools, blood in the urine -signs of decreased red blood cells - unusually weak or tired, fainting spells, lightheadedness -breathing problems -dark urine -dizziness -palpitations -swelling of the ankles, feet, hands -trouble passing urine or change in the amount of urine -weight gain -yellowing of the eyes or skin Side effects that usually do not require medical attention (report to your doctor or health care professional if they continue or are bothersome): -changes in nail or skin color -hair loss -missed menstrual periods -mouth sores -nausea, vomiting This list may not describe all possible side effects. Call your doctor for medical advice about side effects. You may report side effects to FDA at 1-800-FDA-1088. Where should I keep my medicine? This drug is given in a hospital or clinic and will not be stored at home. NOTE: This sheet is a summary. It may not cover all possible information. If you have questions about this medicine,  talk to your doctor, pharmacist, or health care provider.  2018 Elsevier/Gold Standard (2012-07-21 16:22:58)

## 2016-12-22 ENCOUNTER — Other Ambulatory Visit: Payer: Self-pay

## 2016-12-22 ENCOUNTER — Telehealth: Payer: Self-pay

## 2016-12-22 NOTE — Telephone Encounter (Signed)
error 

## 2016-12-28 ENCOUNTER — Other Ambulatory Visit (HOSPITAL_BASED_OUTPATIENT_CLINIC_OR_DEPARTMENT_OTHER): Payer: 59

## 2016-12-28 ENCOUNTER — Encounter: Payer: Self-pay | Admitting: Adult Health

## 2016-12-28 ENCOUNTER — Ambulatory Visit (HOSPITAL_BASED_OUTPATIENT_CLINIC_OR_DEPARTMENT_OTHER): Payer: 59

## 2016-12-28 ENCOUNTER — Ambulatory Visit (HOSPITAL_BASED_OUTPATIENT_CLINIC_OR_DEPARTMENT_OTHER): Payer: 59 | Admitting: Adult Health

## 2016-12-28 VITALS — BP 121/77 | HR 100 | Temp 98.0°F | Resp 18 | Ht 63.0 in | Wt 147.6 lb

## 2016-12-28 DIAGNOSIS — C50211 Malignant neoplasm of upper-inner quadrant of right female breast: Secondary | ICD-10-CM

## 2016-12-28 DIAGNOSIS — R51 Headache: Secondary | ICD-10-CM

## 2016-12-28 DIAGNOSIS — Z170 Estrogen receptor positive status [ER+]: Secondary | ICD-10-CM

## 2016-12-28 DIAGNOSIS — R519 Headache, unspecified: Secondary | ICD-10-CM

## 2016-12-28 DIAGNOSIS — Z95828 Presence of other vascular implants and grafts: Secondary | ICD-10-CM

## 2016-12-28 LAB — COMPREHENSIVE METABOLIC PANEL
ALBUMIN: 3.8 g/dL (ref 3.5–5.0)
ALK PHOS: 89 U/L (ref 40–150)
ALT: 29 U/L (ref 0–55)
AST: 28 U/L (ref 5–34)
Anion Gap: 11 mEq/L (ref 3–11)
BILIRUBIN TOTAL: 0.37 mg/dL (ref 0.20–1.20)
BUN: 7.4 mg/dL (ref 7.0–26.0)
CO2: 24 mEq/L (ref 22–29)
Calcium: 9.1 mg/dL (ref 8.4–10.4)
Chloride: 104 mEq/L (ref 98–109)
Creatinine: 0.7 mg/dL (ref 0.6–1.1)
GLUCOSE: 127 mg/dL (ref 70–140)
Potassium: 3.3 mEq/L — ABNORMAL LOW (ref 3.5–5.1)
Sodium: 140 mEq/L (ref 136–145)
TOTAL PROTEIN: 6.6 g/dL (ref 6.4–8.3)

## 2016-12-28 LAB — CBC WITH DIFFERENTIAL/PLATELET
BASO%: 0.5 % (ref 0.0–2.0)
Basophils Absolute: 0.1 10*3/uL (ref 0.0–0.1)
EOS ABS: 0.1 10*3/uL (ref 0.0–0.5)
EOS%: 0.4 % (ref 0.0–7.0)
HCT: 34.5 % — ABNORMAL LOW (ref 34.8–46.6)
HEMOGLOBIN: 11.7 g/dL (ref 11.6–15.9)
LYMPH#: 2.7 10*3/uL (ref 0.9–3.3)
LYMPH%: 9.9 % — ABNORMAL LOW (ref 14.0–49.7)
MCH: 31.7 pg (ref 25.1–34.0)
MCHC: 34 g/dL (ref 31.5–36.0)
MCV: 93.3 fL (ref 79.5–101.0)
MONO#: 2.2 10*3/uL — AB (ref 0.1–0.9)
MONO%: 8 % (ref 0.0–14.0)
NEUT%: 81.2 % — ABNORMAL HIGH (ref 38.4–76.8)
NEUTROS ABS: 22.4 10*3/uL — AB (ref 1.5–6.5)
Platelets: 268 10*3/uL (ref 145–400)
RBC: 3.7 10*6/uL (ref 3.70–5.45)
RDW: 12.7 % (ref 11.2–14.5)
WBC: 27.6 10*3/uL — AB (ref 3.9–10.3)

## 2016-12-28 MED ORDER — SODIUM CHLORIDE 0.9% FLUSH
10.0000 mL | INTRAVENOUS | Status: DC | PRN
  Administered 2016-12-28: 10 mL via INTRAVENOUS
  Filled 2016-12-28: qty 10

## 2016-12-28 MED ORDER — KETOROLAC TROMETHAMINE 60 MG/2ML IM SOLN
INTRAMUSCULAR | Status: AC
  Filled 2016-12-28: qty 2

## 2016-12-28 MED ORDER — BUTALBITAL-APAP-CAFFEINE 50-325-40 MG PO TABS: 1.0000 | ORAL_TABLET | Freq: Two times a day (BID) | ORAL | 0 refills | Status: DC | PRN

## 2016-12-28 MED ORDER — HEPARIN SOD (PORK) LOCK FLUSH 100 UNIT/ML IV SOLN
500.0000 [IU] | Freq: Once | INTRAVENOUS | Status: AC | PRN
  Administered 2016-12-28: 500 [IU] via INTRAVENOUS
  Filled 2016-12-28: qty 5

## 2016-12-28 MED ORDER — KETOROLAC TROMETHAMINE 60 MG/2ML IM SOLN
60.0000 mg | Freq: Once | INTRAMUSCULAR | Status: AC
  Administered 2016-12-28: 60 mg via INTRAMUSCULAR

## 2016-12-28 NOTE — Patient Instructions (Signed)

## 2016-12-28 NOTE — Patient Instructions (Signed)
Implanted Port Home Guide An implanted port is a type of central line that is placed under the skin. Central lines are used to provide IV access when treatment or nutrition needs to be given through a person's veins. Implanted ports are used for long-term IV access. An implanted port may be placed because:  You need IV medicine that would be irritating to the small veins in your hands or arms.  You need long-term IV medicines, such as antibiotics.  You need IV nutrition for a long period.  You need frequent blood draws for lab tests.  You need dialysis.  Implanted ports are usually placed in the chest area, but they can also be placed in the upper arm, the abdomen, or the leg. An implanted port has two main parts:  Reservoir. The reservoir is round and will appear as a small, raised area under your skin. The reservoir is the part where a needle is inserted to give medicines or draw blood.  Catheter. The catheter is a thin, flexible tube that extends from the reservoir. The catheter is placed into a large vein. Medicine that is inserted into the reservoir goes into the catheter and then into the vein.  How will I care for my incision site? Do not get the incision site wet. Bathe or shower as directed by your health care provider. How is my port accessed? Special steps must be taken to access the port:  Before the port is accessed, a numbing cream can be placed on the skin. This helps numb the skin over the port site.  Your health care provider uses a sterile technique to access the port. ? Your health care provider must put on a mask and sterile gloves. ? The skin over your port is cleaned carefully with an antiseptic and allowed to dry. ? The port is gently pinched between sterile gloves, and a needle is inserted into the port.  Only "non-coring" port needles should be used to access the port. Once the port is accessed, a blood return should be checked. This helps ensure that the port  is in the vein and is not clogged.  If your port needs to remain accessed for a constant infusion, a clear (transparent) bandage will be placed over the needle site. The bandage and needle will need to be changed every week, or as directed by your health care provider.  Keep the bandage covering the needle clean and dry. Do not get it wet. Follow your health care provider's instructions on how to take a shower or bath while the port is accessed.  If your port does not need to stay accessed, no bandage is needed over the port.  What is flushing? Flushing helps keep the port from getting clogged. Follow your health care provider's instructions on how and when to flush the port. Ports are usually flushed with saline solution or a medicine called heparin. The need for flushing will depend on how the port is used.  If the port is used for intermittent medicines or blood draws, the port will need to be flushed: ? After medicines have been given. ? After blood has been drawn. ? As part of routine maintenance.  If a constant infusion is running, the port may not need to be flushed.  How long will my port stay implanted? The port can stay in for as long as your health care provider thinks it is needed. When it is time for the port to come out, surgery will be   done to remove it. The procedure is similar to the one performed when the port was put in. When should I seek immediate medical care? When you have an implanted port, you should seek immediate medical care if:  You notice a bad smell coming from the incision site.  You have swelling, redness, or drainage at the incision site.  You have more swelling or pain at the port site or the surrounding area.  You have a fever that is not controlled with medicine.  This information is not intended to replace advice given to you by your health care provider. Make sure you discuss any questions you have with your health care provider. Document  Released: 09/06/2005 Document Revised: 02/12/2016 Document Reviewed: 05/14/2013 Elsevier Interactive Patient Education  2017 [employer]  

## 2016-12-28 NOTE — Progress Notes (Signed)
Patient Care Team: No Pcp Per Patient as PCP - General (General Practice) Lovell Sheehan, NP as Nurse Practitioner (Nurse Practitioner) Autumn Messing III, MD as Consulting Physician (General Surgery) Nicholas Lose, MD as Consulting Physician (Hematology and Oncology) Kyung Rudd, MD as Consulting Physician (Radiation Oncology)  DIAGNOSIS:  Encounter Diagnoses  Name Primary?  . Malignant neoplasm of upper-inner quadrant of right breast in female, estrogen receptor positive ([place]) Yes  . Acute intractable headache, unspecified headache type     SUMMARY OF ONCOLOGIC HISTORY:   Malignant neoplasm of upper-inner quadrant of right breast in female, estrogen receptor positive ([place])   10/20/2016 Initial Diagnosis    Right breast biopsy 12:30 position: IDC grade 3, ER 100%, PR 95%, Ki-67 30%, HER-2 negative ratio 1.31, screening detected right breast asymmetry and calcifications 1.1 cm, right axillary borderline enlarged lymph node biopsy benign, T1c N0 stage IA clinical stage      11/18/2016 Surgery    Right lumpectomy: IDC with DCIS, 1 cm, margins negative, 0/4 lymph nodes, ER 100%, PR 95%, Ki-67 30%, HER-2 negative ratio 1.31, T1 BN 0 stage IA       11/25/2016 Oncotype testing    Oncotype DX score 27: 18% risk of recurrence with tamoxifen alone intermediate risk      12/21/2016 -  Adjuvant Chemotherapy    Taxotere and Cytoxan x 4 cycles       Genetic testing   11/16/2016 Initial Diagnosis    Genetic testing was negative for mutations in the 43 genes on [employer]'s Common Cancers panel (APC, ATM, AXIN2, BARD1, BMPR1A, BRCA1, BRCA2, BRIP1, CDH1, CDKN2A, CHEK2, DICER1, EPCAM, GREM1, HOXB13, KIT, MEN1, MLH1, MSH2, MSH6, MUTYH, NBN, NF1, PALB2, PDGFRA, PMS2, POLD1, POLE, PTEN, RAD50, RAD51C, RAD51D, SDHA, SDHB, SDHC, SDHD, SMAD4, SMARCA4, STK11, TP53, TSC1, TSC2, VHL).       CHIEF COMPLIANT: cycle 1 day 8 of TC  INTERVAL HISTORY: Lisa Anthony is a 51 year old with above-mentioned history of  right breast cancer who is here today on cycle 1 day 8 of Taxotere/Cytoxan.  She tolerated treatment very well and needed minimal anti-emetics.  Her only c/o is non intractable headache, that she has tried tylenol, advil, excedrin, and vicodin for that will not go away.  She is drinking fluids and eating well.  She denies any associated symptoms with her headaches such as nausea/vomiting, blurred vision/double vision, weakness, focal weakness or numbness.    REVIEW OF SYSTEMS:   Review of Systems  Constitutional: Negative for chills, fever, malaise/fatigue and weight loss.  HENT: Negative for hearing loss and tinnitus.   Eyes: Negative for blurred vision and double vision.  Respiratory: Negative for cough and shortness of breath.   Cardiovascular: Negative for chest pain, palpitations and leg swelling.  Gastrointestinal: Negative for abdominal pain, constipation, diarrhea, heartburn, nausea and vomiting.  Neurological: Positive for headaches. Negative for dizziness, tingling, tremors and focal weakness.  Endo/Heme/Allergies: Negative for environmental allergies. Does not bruise/bleed easily.  Psychiatric/Behavioral: Negative for depression.     All other systems were reviewed with the patient and are negative.  I have reviewed the past medical history, past surgical history, social history and family history with the patient and they are unchanged from previous note.  ALLERGIES:  is allergic to sulfa antibiotics.  MEDICATIONS:  Current Outpatient Prescriptions  Medication Sig Dispense Refill  . butalbital-acetaminophen-caffeine (FIORICET, ESGIC) 50-325-40 MG tablet Take 1 tablet by mouth every 12 (twelve) hours as needed for headache. 10 tablet 0  . dexamethasone (DECADRON) 4  MG tablet Take 1 tablet (4 mg total) by mouth daily. 1 tablet the day before chemotherapy on 1 tablet the day after chemotherapy 10 tablet 0  . diphenhydrAMINE (SLEEP AID, DIPHENHYDRAMINE,) 25 MG tablet Take 25 mg by  mouth at bedtime.    Marland Kitchen HYDROcodone-acetaminophen (NORCO/VICODIN) 5-325 MG tablet Take 1-2 tablets by mouth every 4 (four) hours as needed for moderate pain or severe pain. 10 tablet 0  . lidocaine-prilocaine (EMLA) cream Apply to affected area once 30 g 3  . LORazepam (ATIVAN) 0.5 MG tablet Take 1 tablet (0.5 mg total) by mouth every 6 (six) hours as needed (Nausea or vomiting). 30 tablet 0  . Minocycline HCl (SOLODYN) 80 MG TB24 Take 1 tablet by mouth 2 (two) times a week. 1-2 tablets per week     . ondansetron (ZOFRAN) 8 MG tablet Take 1 tablet (8 mg total) by mouth 2 (two) times daily as needed for refractory nausea / vomiting. Start on day 3 after chemo. 30 tablet 1  . prochlorperazine (COMPAZINE) 10 MG tablet Take 1 tablet (10 mg total) by mouth every 6 (six) hours as needed (Nausea or vomiting). 30 tablet 1  . venlafaxine XR (EFFEXOR-XR) 37.5 MG 24 hr capsule Take 1 capsule (37.5 mg total) by mouth daily with breakfast. 30 capsule 3   No current facility-administered medications for this visit.     PHYSICAL EXAMINATION: ECOG PERFORMANCE STATUS: 1 - Symptomatic but completely ambulatory  Vitals:   12/28/16 0947  BP: 121/77  Pulse: 100  Resp: 18  Temp: 98 F (36.7 C)   Filed Weights   12/28/16 0947  Weight: 147 lb 9.6 oz (67 kg)   GENERAL: Patient is a well appearing female in no acute distress HEENT:  Sclerae anicteric. PERRL Oropharynx clear and moist. No ulcerations or evidence of oropharyngeal candidiasis. Neck is supple.  NODES:  No cervical, supraclavicular, or axillary lymphadenopathy palpated.  BREAST EXAM:  Deferred. LUNGS:  Clear to auscultation bilaterally.  No wheezes or rhonchi. HEART:  Regular rate and rhythm. No murmur appreciated. ABDOMEN:  Soft, nontender.  Positive, normoactive bowel sounds. No organomegaly palpated. MSK:  No focal spinal tenderness to palpation. Full range of motion bilaterally in the upper extremities. EXTREMITIES:  No peripheral edema.     SKIN:  Clear with no obvious rashes or skin changes. No nail dyscrasia. NEURO:  Nonfocal. Well oriented.  Appropriate affect.    LABORATORY DATA:  I have reviewed the data as listed   Chemistry      Component Value Date/Time   NA 140 12/28/2016 0847   K 3.3 (L) 12/28/2016 0847   CL 101 12/10/2016 0841   CO2 24 12/28/2016 0847   BUN 7.4 12/28/2016 0847   CREATININE 0.7 12/28/2016 0847      Component Value Date/Time   CALCIUM 9.1 12/28/2016 0847   ALKPHOS 89 12/28/2016 0847   AST 28 12/28/2016 0847   ALT 29 12/28/2016 0847   BILITOT 0.37 12/28/2016 0847       Lab Results  Component Value Date   WBC 27.6 (H) 12/28/2016   HGB 11.7 12/28/2016   HCT 34.5 (L) 12/28/2016   MCV 93.3 12/28/2016   PLT 268 12/28/2016   NEUTROABS 22.4 (H) 12/28/2016    ASSESSMENT & PLAN:  Malignant neoplasm of upper-inner quadrant of right breast in female, estrogen receptor positive ([place]) Right lumpectomy: IDC with DCIS, 1 cm, margins negative, 0/4 lymph nodes, ER 100%, PR 95%, Ki-67 30%, HER-2 negative ratio 1.31, T1 BN  0 stage IA Oncotype DX score 27: 18% risk of recurrence with tamoxifen alone intermediate risk  Oncotype counseling: I discussed with her the details of Oncotype DX test result which showed that she had a high intermediate risk of recurrence score of 27. This translates into 18% risk of recurrence with tamoxifen alone.  Recommendation: 1. I recommended adjuvant chemotherapy with Taxotere and Cytoxan every 3 weeks 4 2. radiation therapy followed by 3. Adjuvant antiestrogen therapy   Plan: Yissel is doing well today.  She tolerated her first cycle of chemotherapy very well.  I reviewed her labs with her and her WBC is elevated.  She does note after I reviewed her labs with her that she did have some bone pain following neulasta.  For her headache we will give her an IM shot of Toradol, and I prescribed #10 fioricet.  I counseled her on possible adverse effects of fioricet and  not to take with any other medications that may make her tired.  She should not drive after taking it.  She will return in two weeks for labs and cycle 2 day 1 of Taxotere and Cytoxan.  I reviewed red flags with her on when to go to er regarding her headaches.   I spent 25 minutes talking to the patient of which more than half was spent in counseling and coordination of care.   The patient has a good understanding of the overall plan. she agrees with it. she will call with any problems that may develop before the next visit here.   Scot Dock, NP 12/28/16

## 2016-12-31 ENCOUNTER — Encounter (HOSPITAL_COMMUNITY): Payer: Self-pay

## 2017-01-11 ENCOUNTER — Encounter: Payer: Self-pay | Admitting: Hematology and Oncology

## 2017-01-11 ENCOUNTER — Ambulatory Visit (HOSPITAL_BASED_OUTPATIENT_CLINIC_OR_DEPARTMENT_OTHER): Payer: 59 | Admitting: Hematology and Oncology

## 2017-01-11 ENCOUNTER — Ambulatory Visit (HOSPITAL_BASED_OUTPATIENT_CLINIC_OR_DEPARTMENT_OTHER): Payer: 59

## 2017-01-11 ENCOUNTER — Encounter: Payer: Self-pay | Admitting: *Deleted

## 2017-01-11 ENCOUNTER — Ambulatory Visit: Payer: 59

## 2017-01-11 ENCOUNTER — Other Ambulatory Visit: Payer: 59

## 2017-01-11 DIAGNOSIS — Z95828 Presence of other vascular implants and grafts: Secondary | ICD-10-CM

## 2017-01-11 DIAGNOSIS — Z5189 Encounter for other specified aftercare: Secondary | ICD-10-CM

## 2017-01-11 DIAGNOSIS — Z170 Estrogen receptor positive status [ER+]: Secondary | ICD-10-CM | POA: Diagnosis not present

## 2017-01-11 DIAGNOSIS — C50211 Malignant neoplasm of upper-inner quadrant of right female breast: Secondary | ICD-10-CM

## 2017-01-11 DIAGNOSIS — Z5111 Encounter for antineoplastic chemotherapy: Secondary | ICD-10-CM

## 2017-01-11 LAB — CBC WITH DIFFERENTIAL/PLATELET
BASO%: 0.8 % (ref 0.0–2.0)
Basophils Absolute: 0.1 10*3/uL (ref 0.0–0.1)
EOS%: 0.5 % (ref 0.0–7.0)
Eosinophils Absolute: 0 10*3/uL (ref 0.0–0.5)
HCT: 33 % — ABNORMAL LOW (ref 34.8–46.6)
HGB: 11.3 g/dL — ABNORMAL LOW (ref 11.6–15.9)
LYMPH%: 27.5 % (ref 14.0–49.7)
MCH: 32.1 pg (ref 25.1–34.0)
MCHC: 34.3 g/dL (ref 31.5–36.0)
MCV: 93.7 fL (ref 79.5–101.0)
MONO#: 0.7 10*3/uL (ref 0.1–0.9)
MONO%: 8.9 % (ref 0.0–14.0)
NEUT%: 62.3 % (ref 38.4–76.8)
NEUTROS ABS: 4.9 10*3/uL (ref 1.5–6.5)
PLATELETS: 342 10*3/uL (ref 145–400)
RBC: 3.52 10*6/uL — AB (ref 3.70–5.45)
RDW: 13.7 % (ref 11.2–14.5)
WBC: 7.9 10*3/uL (ref 3.9–10.3)
lymph#: 2.2 10*3/uL (ref 0.9–3.3)

## 2017-01-11 LAB — COMPREHENSIVE METABOLIC PANEL
ALT: 26 U/L (ref 0–55)
AST: 22 U/L (ref 5–34)
Albumin: 4 g/dL (ref 3.5–5.0)
Alkaline Phosphatase: 74 U/L (ref 40–150)
Anion Gap: 10 mEq/L (ref 3–11)
BILIRUBIN TOTAL: 0.82 mg/dL (ref 0.20–1.20)
BUN: 13.2 mg/dL (ref 7.0–26.0)
CO2: 26 meq/L (ref 22–29)
CREATININE: 0.8 mg/dL (ref 0.6–1.1)
Calcium: 9.2 mg/dL (ref 8.4–10.4)
Chloride: 104 mEq/L (ref 98–109)
EGFR: 84 mL/min/{1.73_m2} — ABNORMAL LOW (ref >90)
GLUCOSE: 114 mg/dL (ref 70–140)
Potassium: 3.4 mEq/L — ABNORMAL LOW (ref 3.5–5.1)
SODIUM: 140 meq/L (ref 136–145)
TOTAL PROTEIN: 6.7 g/dL (ref 6.4–8.3)

## 2017-01-11 MED ORDER — SODIUM CHLORIDE 0.9% FLUSH
10.0000 mL | INTRAVENOUS | Status: DC | PRN
  Administered 2017-01-11: 10 mL via INTRAVENOUS
  Filled 2017-01-11: qty 10

## 2017-01-11 MED ORDER — PALONOSETRON HCL INJECTION 0.25 MG/5ML: 0.2500 mg | Freq: Once | INTRAVENOUS | Status: DC

## 2017-01-11 MED ORDER — DEXAMETHASONE SODIUM PHOSPHATE 10 MG/ML IJ SOLN
10.0000 mg | Freq: Once | INTRAMUSCULAR | Status: AC
  Administered 2017-01-11: 10 mg via INTRAVENOUS

## 2017-01-11 MED ORDER — PALONOSETRON HCL INJECTION 0.25 MG/5ML
INTRAVENOUS | Status: AC
  Filled 2017-01-11: qty 5

## 2017-01-11 MED ORDER — DEXTROSE 5 % IV SOLN
75.0000 mg/m2 | Freq: Once | INTRAVENOUS | Status: AC
  Administered 2017-01-11: 130 mg via INTRAVENOUS
  Filled 2017-01-11: qty 13

## 2017-01-11 MED ORDER — SODIUM CHLORIDE 0.9 % IV SOLN
600.0000 mg/m2 | Freq: Once | INTRAVENOUS | Status: AC
  Administered 2017-01-11: 1060 mg via INTRAVENOUS
  Filled 2017-01-11: qty 53

## 2017-01-11 MED ORDER — PEGFILGRASTIM 6 MG/0.6ML ~~LOC~~ PSKT
6.0000 mg | PREFILLED_SYRINGE | Freq: Once | SUBCUTANEOUS | Status: AC
  Administered 2017-01-11: 6 mg via SUBCUTANEOUS
  Filled 2017-01-11: qty 0.6

## 2017-01-11 MED ORDER — SODIUM CHLORIDE 0.9 % IV SOLN
Freq: Once | INTRAVENOUS | Status: AC
  Administered 2017-01-11: 14:00:00 via INTRAVENOUS

## 2017-01-11 MED ORDER — DEXAMETHASONE SODIUM PHOSPHATE 10 MG/ML IJ SOLN
INTRAMUSCULAR | Status: AC
  Filled 2017-01-11: qty 1

## 2017-01-11 MED ORDER — SODIUM CHLORIDE 0.9% FLUSH
10.0000 mL | INTRAVENOUS | Status: DC | PRN
  Filled 2017-01-11: qty 10

## 2017-01-11 MED ORDER — HEPARIN SOD (PORK) LOCK FLUSH 100 UNIT/ML IV SOLN
500.0000 [IU] | Freq: Once | INTRAVENOUS | Status: DC | PRN
  Filled 2017-01-11: qty 5

## 2017-01-11 MED ORDER — PALONOSETRON HCL INJECTION 0.25 MG/5ML
0.2500 mg | Freq: Once | INTRAVENOUS | Status: AC
  Administered 2017-01-11: 0.25 mg via INTRAVENOUS

## 2017-01-11 NOTE — Progress Notes (Signed)
Patient Care Team: No Pcp Per Patient as PCP - General (General Practice) Lovell Sheehan, NP as Nurse Practitioner (Nurse Practitioner) Autumn Messing III, MD as Consulting Physician (General Surgery) Nicholas Lose, MD as Consulting Physician (Hematology and Oncology) Kyung Rudd, MD as Consulting Physician (Radiation Oncology)  DIAGNOSIS:  Encounter Diagnosis  Name Primary?  . Malignant neoplasm of upper-inner quadrant of right breast in female, estrogen receptor positive ([place])     SUMMARY OF ONCOLOGIC HISTORY:   Malignant neoplasm of upper-inner quadrant of right breast in female, estrogen receptor positive ([place])   10/20/2016 Initial Diagnosis    Right breast biopsy 12:30 position: IDC grade 3, ER 100%, PR 95%, Ki-67 30%, HER-2 negative ratio 1.31, screening detected right breast asymmetry and calcifications 1.1 cm, right axillary borderline enlarged lymph node biopsy benign, T1c N0 stage IA clinical stage      11/18/2016 Surgery    Right lumpectomy: IDC with DCIS, 1 cm, margins negative, 0/4 lymph nodes, ER 100%, PR 95%, Ki-67 30%, HER-2 negative ratio 1.31, T1 BN 0 stage IA       11/25/2016 Oncotype testing    Oncotype DX score 27: 18% risk of recurrence with tamoxifen alone intermediate risk      12/21/2016 -  Adjuvant Chemotherapy    Taxotere and Cytoxan x 4 cycles       Genetic testing   11/16/2016 Initial Diagnosis    Genetic testing was negative for mutations in the 43 genes on [employer]'s Common Cancers panel (APC, ATM, AXIN2, BARD1, BMPR1A, BRCA1, BRCA2, BRIP1, CDH1, CDKN2A, CHEK2, DICER1, EPCAM, GREM1, HOXB13, KIT, MEN1, MLH1, MSH2, MSH6, MUTYH, NBN, NF1, PALB2, PDGFRA, PMS2, POLD1, POLE, PTEN, RAD50, RAD51C, RAD51D, SDHA, SDHB, SDHC, SDHD, SMAD4, SMARCA4, STK11, TP53, TSC1, TSC2, VHL).       CHIEF COMPLIANT: Cycle 2 Taxotere and Cytoxan  INTERVAL HISTORY: Lisa Anthony is a 51 year old with above-mentioned history of right breast cancer currently on adjuvant chemotherapy  and today is cycle 2 of Taxotere and Cytoxan. She tolerated cycle 1 extremely well. She had no nausea vomiting and had fatigue for 2 or 3 days but otherwise fully recovered and has been fairly active. She did have intense headache that lasted for a couple of days and she came in and received an injection of Toradol and that seemed to have resolved her headaches. She is not using any further antiemetics.  REVIEW OF SYSTEMS:   Constitutional: Denies fevers, chills or abnormal weight loss Eyes: Denies blurriness of vision Ears, nose, mouth, throat, and face: Denies mucositis or sore throat Respiratory: Denies cough, dyspnea or wheezes Cardiovascular: Denies palpitation, chest discomfort Gastrointestinal:  Denies nausea, heartburn or change in bowel habits Skin: Denies abnormal skin rashes Lymphatics: Denies new lymphadenopathy or easy bruising Neurological:Denies numbness, tingling or new weaknesses Behavioral/Psych: Mood is stable, no new changes  Extremities: No lower extremity edema Breast:  denies any pain or lumps or nodules in either breasts All other systems were reviewed with the patient and are negative.  I have reviewed the past medical history, past surgical history, social history and family history with the patient and they are unchanged from previous note.  ALLERGIES:  is allergic to sulfa antibiotics.  MEDICATIONS:  Current Outpatient Prescriptions  Medication Sig Dispense Refill  . butalbital-acetaminophen-caffeine (FIORICET, ESGIC) 50-325-40 MG tablet Take 1 tablet by mouth every 12 (twelve) hours as needed for headache. 10 tablet 0  . dexamethasone (DECADRON) 4 MG tablet Take 1 tablet (4 mg total) by mouth daily. 1 tablet  the day before chemotherapy on 1 tablet the day after chemotherapy 10 tablet 0  . diphenhydrAMINE (SLEEP AID, DIPHENHYDRAMINE,) 25 MG tablet Take 25 mg by mouth at bedtime.    Marland Kitchen HYDROcodone-acetaminophen (NORCO/VICODIN) 5-325 MG tablet Take 1-2 tablets by  mouth every 4 (four) hours as needed for moderate pain or severe pain. 10 tablet 0  . lidocaine-prilocaine (EMLA) cream Apply to affected area once 30 g 3  . LORazepam (ATIVAN) 0.5 MG tablet Take 1 tablet (0.5 mg total) by mouth every 6 (six) hours as needed (Nausea or vomiting). 30 tablet 0  . Minocycline HCl (SOLODYN) 80 MG TB24 Take 1 tablet by mouth 2 (two) times a week. 1-2 tablets per week     . ondansetron (ZOFRAN) 8 MG tablet Take 1 tablet (8 mg total) by mouth 2 (two) times daily as needed for refractory nausea / vomiting. Start on day 3 after chemo. 30 tablet 1  . prochlorperazine (COMPAZINE) 10 MG tablet Take 1 tablet (10 mg total) by mouth every 6 (six) hours as needed (Nausea or vomiting). 30 tablet 1  . venlafaxine XR (EFFEXOR-XR) 37.5 MG 24 hr capsule Take 1 capsule (37.5 mg total) by mouth daily with breakfast. 30 capsule 3   No current facility-administered medications for this visit.     PHYSICAL EXAMINATION: ECOG PERFORMANCE STATUS: 1 - Symptomatic but completely ambulatory  Vitals:   01/11/17 1111  BP: 132/83  Pulse: 85  Resp: 18  Temp: 98.1 F (36.7 C)   Filed Weights   01/11/17 1111  Weight: 147 lb 14.4 oz (67.1 kg)    GENERAL:alert, no distress and comfortable SKIN: skin color, texture, turgor are normal, no rashes or significant lesions EYES: normal, Conjunctiva are pink and non-injected, sclera clear OROPHARYNX:no exudate, no erythema and lips, buccal mucosa, and tongue normal  NECK: supple, thyroid normal size, non-tender, without nodularity LYMPH:  no palpable lymphadenopathy in the cervical, axillary or inguinal LUNGS: clear to auscultation and percussion with normal breathing effort HEART: regular rate & rhythm and no murmurs and no lower extremity edema ABDOMEN:abdomen soft, non-tender and normal bowel sounds MUSCULOSKELETAL:no cyanosis of digits and no clubbing  NEURO: alert & oriented x 3 with fluent speech, no focal motor/sensory  deficits EXTREMITIES: No lower extremity edema  LABORATORY DATA:  I have reviewed the data as listed   Chemistry      Component Value Date/Time   NA 140 01/11/2017 1021   K 3.4 (L) 01/11/2017 1021   CL 101 12/10/2016 0841   CO2 26 01/11/2017 1021   BUN 13.2 01/11/2017 1021   CREATININE 0.8 01/11/2017 1021      Component Value Date/Time   CALCIUM 9.2 01/11/2017 1021   ALKPHOS 74 01/11/2017 1021   AST 22 01/11/2017 1021   ALT 26 01/11/2017 1021   BILITOT 0.82 01/11/2017 1021       Lab Results  Component Value Date   WBC 7.9 01/11/2017   HGB 11.3 (L) 01/11/2017   HCT 33.0 (L) 01/11/2017   MCV 93.7 01/11/2017   PLT 342 01/11/2017   NEUTROABS 4.9 01/11/2017    ASSESSMENT & PLAN:  Malignant neoplasm of upper-inner quadrant of right breast in female, estrogen receptor positive ([facility]) Right lumpectomy: IDC with DCIS, 1 cm, margins negative, 0/4 lymph nodes, ER 100%, PR 95%, Ki-67 30%, HER-2 negative ratio 1.31, T1 BN 0 stage IA Oncotype DX score 27: 18% risk of recurrence with tamoxifen alone intermediate risk  Oncotype counseling: I discussed with her the details  of Oncotype DX test result which showed that she had a high intermediate risk of recurrence score of 27. This translates into 18% risk of recurrence with tamoxifen alone.  Recommendation: 1. I recommended adjuvant chemotherapy with Taxotere and Cytoxan every 3 weeks 4  started 12/21/2016 2. radiation therapy followed by 3. Adjuvant antiestrogen therapy ---------------------------------------------------------------------------------------------------------------------------------------- Current treatment: Cycle 2 Taxotere and Cytoxan Chemotherapy toxicities: 1. Headaches Monitoring closely for chemotherapy toxicities Labs were reviewed Return to clinic in 3 weeks for cycle 3  I spent 25 minutes talking to the patient of which more than half was spent in counseling and coordination of care.  No orders of the  defined types were placed in this encounter.  The patient has a good understanding of the overall plan. she agrees with it. she will call with any problems that may develop before the next visit here.   Rulon Eisenmenger, MD 01/11/17

## 2017-01-11 NOTE — Assessment & Plan Note (Signed)
Right lumpectomy: IDC with DCIS, 1 cm, margins negative, 0/4 lymph nodes, ER 100%, PR 95%, Ki-67 30%, HER-2 negative ratio 1.31, T1 BN 0 stage IA Oncotype DX score 27: 18% risk of recurrence with tamoxifen alone intermediate risk  Oncotype counseling: I discussed with her the details of Oncotype DX test result which showed that she had a high intermediate risk of recurrence score of 27. This translates into 18% risk of recurrence with tamoxifen alone.  Recommendation: 1. I recommended adjuvant chemotherapy with Taxotere and Cytoxan every 3 weeks 4 started 12/21/2016 2. radiation therapy followed by 3. Adjuvant antiestrogen therapy ---------------------------------------------------------------------------------------------------------------------------------------- Current treatment: Cycle 2 Taxotere and Cytoxan Chemotherapy toxicities:  Return to clinic in 3 weeks for cycle 3

## 2017-01-18 ENCOUNTER — Ambulatory Visit: Payer: 59 | Admitting: Hematology and Oncology

## 2017-01-18 ENCOUNTER — Other Ambulatory Visit: Payer: 59

## 2017-01-24 DIAGNOSIS — L820 Inflamed seborrheic keratosis: Secondary | ICD-10-CM | POA: Diagnosis not present

## 2017-02-01 ENCOUNTER — Ambulatory Visit (HOSPITAL_BASED_OUTPATIENT_CLINIC_OR_DEPARTMENT_OTHER): Payer: 59

## 2017-02-01 ENCOUNTER — Ambulatory Visit: Payer: 59

## 2017-02-01 ENCOUNTER — Ambulatory Visit (HOSPITAL_BASED_OUTPATIENT_CLINIC_OR_DEPARTMENT_OTHER): Payer: 59 | Admitting: Hematology and Oncology

## 2017-02-01 ENCOUNTER — Encounter: Payer: Self-pay | Admitting: Hematology and Oncology

## 2017-02-01 ENCOUNTER — Other Ambulatory Visit (HOSPITAL_BASED_OUTPATIENT_CLINIC_OR_DEPARTMENT_OTHER): Payer: 59

## 2017-02-01 DIAGNOSIS — C50211 Malignant neoplasm of upper-inner quadrant of right female breast: Secondary | ICD-10-CM

## 2017-02-01 DIAGNOSIS — Z5111 Encounter for antineoplastic chemotherapy: Secondary | ICD-10-CM | POA: Diagnosis not present

## 2017-02-01 DIAGNOSIS — Z170 Estrogen receptor positive status [ER+]: Secondary | ICD-10-CM

## 2017-02-01 DIAGNOSIS — Z5189 Encounter for other specified aftercare: Secondary | ICD-10-CM | POA: Diagnosis not present

## 2017-02-01 DIAGNOSIS — Z95828 Presence of other vascular implants and grafts: Secondary | ICD-10-CM

## 2017-02-01 LAB — CBC WITH DIFFERENTIAL/PLATELET
BASO%: 1.2 % (ref 0.0–2.0)
Basophils Absolute: 0.1 10*3/uL (ref 0.0–0.1)
EOS%: 0.2 % (ref 0.0–7.0)
Eosinophils Absolute: 0 10*3/uL (ref 0.0–0.5)
HCT: 32.5 % — ABNORMAL LOW (ref 34.8–46.6)
HGB: 11.1 g/dL — ABNORMAL LOW (ref 11.6–15.9)
LYMPH%: 21.5 % (ref 14.0–49.7)
MCH: 32.1 pg (ref 25.1–34.0)
MCHC: 34.2 g/dL (ref 31.5–36.0)
MCV: 93.9 fL (ref 79.5–101.0)
MONO#: 1 10*3/uL — AB (ref 0.1–0.9)
MONO%: 10.1 % (ref 0.0–14.0)
NEUT%: 67 % (ref 38.4–76.8)
NEUTROS ABS: 6.4 10*3/uL (ref 1.5–6.5)
Platelets: 390 10*3/uL (ref 145–400)
RBC: 3.46 10*6/uL — AB (ref 3.70–5.45)
RDW: 15.1 % — ABNORMAL HIGH (ref 11.2–14.5)
WBC: 9.5 10*3/uL (ref 3.9–10.3)
lymph#: 2 10*3/uL (ref 0.9–3.3)

## 2017-02-01 LAB — COMPREHENSIVE METABOLIC PANEL
ALT: 34 U/L (ref 0–55)
AST: 25 U/L (ref 5–34)
Albumin: 4.3 g/dL (ref 3.5–5.0)
Alkaline Phosphatase: 85 U/L (ref 40–150)
Anion Gap: 11 mEq/L (ref 3–11)
BILIRUBIN TOTAL: 0.74 mg/dL (ref 0.20–1.20)
BUN: 13.5 mg/dL (ref 7.0–26.0)
CO2: 26 meq/L (ref 22–29)
CREATININE: 0.7 mg/dL (ref 0.6–1.1)
Calcium: 9.9 mg/dL (ref 8.4–10.4)
Chloride: 104 mEq/L (ref 98–109)
EGFR: >90 mL/min/{1.73_m2} (ref >90)
GLUCOSE: 94 mg/dL (ref 70–140)
Potassium: 3.6 mEq/L (ref 3.5–5.1)
SODIUM: 142 meq/L (ref 136–145)
TOTAL PROTEIN: 7.2 g/dL (ref 6.4–8.3)

## 2017-02-01 MED ORDER — SODIUM CHLORIDE 0.9% FLUSH
10.0000 mL | INTRAVENOUS | Status: DC | PRN
  Administered 2017-02-01: 10 mL via INTRAVENOUS
  Filled 2017-02-01: qty 10

## 2017-02-01 MED ORDER — PALONOSETRON HCL INJECTION 0.25 MG/5ML
INTRAVENOUS | Status: AC
  Filled 2017-02-01: qty 5

## 2017-02-01 MED ORDER — SODIUM CHLORIDE 0.9 % IV SOLN
Freq: Once | INTRAVENOUS | Status: AC
  Administered 2017-02-01: 13:00:00 via INTRAVENOUS

## 2017-02-01 MED ORDER — SODIUM CHLORIDE 0.9% FLUSH
10.0000 mL | INTRAVENOUS | Status: DC | PRN
  Administered 2017-02-01: 10 mL
  Filled 2017-02-01: qty 10

## 2017-02-01 MED ORDER — DEXAMETHASONE SODIUM PHOSPHATE 10 MG/ML IJ SOLN
INTRAMUSCULAR | Status: AC
  Filled 2017-02-01: qty 1

## 2017-02-01 MED ORDER — DOCETAXEL CHEMO INJECTION 160 MG/16ML
75.0000 mg/m2 | Freq: Once | INTRAVENOUS | Status: AC
  Administered 2017-02-01: 130 mg via INTRAVENOUS
  Filled 2017-02-01: qty 13

## 2017-02-01 MED ORDER — PALONOSETRON HCL INJECTION 0.25 MG/5ML
0.2500 mg | Freq: Once | INTRAVENOUS | Status: AC
  Administered 2017-02-01: 0.25 mg via INTRAVENOUS

## 2017-02-01 MED ORDER — HEPARIN SOD (PORK) LOCK FLUSH 100 UNIT/ML IV SOLN
500.0000 [IU] | Freq: Once | INTRAVENOUS | Status: AC | PRN
  Administered 2017-02-01: 500 [IU]
  Filled 2017-02-01: qty 5

## 2017-02-01 MED ORDER — CYCLOPHOSPHAMIDE CHEMO INJECTION 1 GM
600.0000 mg/m2 | Freq: Once | INTRAMUSCULAR | Status: AC
  Administered 2017-02-01: 1060 mg via INTRAVENOUS
  Filled 2017-02-01: qty 53

## 2017-02-01 MED ORDER — HEPARIN SOD (PORK) LOCK FLUSH 100 UNIT/ML IV SOLN
500.0000 [IU] | Freq: Once | INTRAVENOUS | Status: DC | PRN
  Filled 2017-02-01: qty 5

## 2017-02-01 MED ORDER — DEXAMETHASONE SODIUM PHOSPHATE 10 MG/ML IJ SOLN
10.0000 mg | Freq: Once | INTRAMUSCULAR | Status: AC
  Administered 2017-02-01: 10 mg via INTRAVENOUS

## 2017-02-01 MED ORDER — PEGFILGRASTIM 6 MG/0.6ML ~~LOC~~ PSKT
6.0000 mg | PREFILLED_SYRINGE | Freq: Once | SUBCUTANEOUS | Status: AC
  Administered 2017-02-01: 6 mg via SUBCUTANEOUS
  Filled 2017-02-01: qty 0.6

## 2017-02-01 NOTE — Patient Instructions (Signed)

## 2017-02-01 NOTE — Patient Instructions (Signed)
[facility] Discharge Instructions for Patients Receiving Chemotherapy  Today you received the following chemotherapy agents;  Taxotere and Cytoxan.    To help prevent nausea and vomiting after your treatment, we encourage you to take your nausea medication as directed.     If you develop nausea and vomiting that is not controlled by your nausea medication, call the clinic.   BELOW ARE SYMPTOMS THAT SHOULD BE REPORTED IMMEDIATELY:  *FEVER GREATER THAN 100.5 F  *CHILLS WITH OR WITHOUT FEVER  NAUSEA AND VOMITING THAT IS NOT CONTROLLED WITH YOUR NAUSEA MEDICATION  *UNUSUAL SHORTNESS OF BREATH  *UNUSUAL BRUISING OR BLEEDING  TENDERNESS IN MOUTH AND THROAT WITH OR WITHOUT PRESENCE OF ULCERS  *URINARY PROBLEMS  *BOWEL PROBLEMS  UNUSUAL RASH Items with * indicate a potential emergency and should be followed up as soon as possible.  Feel free to call the clinic you have any questions or concerns. The clinic phone number is [phone].  Please show the CHEMO ALERT CARD at check-in to the Emergency Department and triage nurse.   

## 2017-02-01 NOTE — Progress Notes (Signed)
Patient Care Team: Patient, No Pcp Per as PCP - General (General Practice) Lovell Sheehan, NP as Nurse Practitioner (Nurse Practitioner) Jovita Kussmaul, MD as Consulting Physician (General Surgery) Nicholas Lose, MD as Consulting Physician (Hematology and Oncology) Kyung Rudd, MD as Consulting Physician (Radiation Oncology)  DIAGNOSIS:  Encounter Diagnosis  Name Primary?  . Malignant neoplasm of upper-inner quadrant of right breast in female, estrogen receptor positive ([place])     SUMMARY OF ONCOLOGIC HISTORY:   Malignant neoplasm of upper-inner quadrant of right breast in female, estrogen receptor positive ([place])   10/20/2016 Initial Diagnosis    Right breast biopsy 12:30 position: IDC grade 3, ER 100%, PR 95%, Ki-67 30%, HER-2 negative ratio 1.31, screening detected right breast asymmetry and calcifications 1.1 cm, right axillary borderline enlarged lymph node biopsy benign, T1c N0 stage IA clinical stage      11/18/2016 Surgery    Right lumpectomy: IDC with DCIS, 1 cm, margins negative, 0/4 lymph nodes, ER 100%, PR 95%, Ki-67 30%, HER-2 negative ratio 1.31, T1 BN 0 stage IA       11/25/2016 Oncotype testing    Oncotype DX score 27: 18% risk of recurrence with tamoxifen alone intermediate risk      12/21/2016 -  Adjuvant Chemotherapy    Taxotere and Cytoxan x 4 cycles       Genetic testing   11/16/2016 Initial Diagnosis    Genetic testing was negative for mutations in the 43 genes on [employer]'s Common Cancers panel (APC, ATM, AXIN2, BARD1, BMPR1A, BRCA1, BRCA2, BRIP1, CDH1, CDKN2A, CHEK2, DICER1, EPCAM, GREM1, HOXB13, KIT, MEN1, MLH1, MSH2, MSH6, MUTYH, NBN, NF1, PALB2, PDGFRA, PMS2, POLD1, POLE, PTEN, RAD50, RAD51C, RAD51D, SDHA, SDHB, SDHC, SDHD, SMAD4, SMARCA4, STK11, TP53, TSC1, TSC2, VHL).       CHIEF COMPLIANT: Cycle 3 Taxotere and Cytoxan  INTERVAL HISTORY: Lisa Anthony is a 51 year old with above-mentioned history of right breast cancer treated with lumpectomy and is  currently on adjuvant chemotherapy. Today is cycle 3 of chemotherapy. She tolerated cycle 2 extremely well. She had one or 2 days of headaches but no nausea. She did not take any antiemetics. Denies any neuropathy. Excellent strength and performance status.  REVIEW OF SYSTEMS:   Constitutional: Denies fevers, chills or abnormal weight loss Eyes: Denies blurriness of vision Ears, nose, mouth, throat, and face: Denies mucositis or sore throat Respiratory: Denies cough, dyspnea or wheezes Cardiovascular: Denies palpitation, chest discomfort Gastrointestinal:  Denies nausea, heartburn or change in bowel habits Skin: Denies abnormal skin rashes Lymphatics: Denies new lymphadenopathy or easy bruising Neurological:Denies numbness, tingling or new weaknesses Behavioral/Psych: Mood is stable, no new changes  Extremities: No lower extremity edema Breast:  denies any pain or lumps or nodules in either breasts All other systems were reviewed with the patient and are negative.  I have reviewed the past medical history, past surgical history, social history and family history with the patient and they are unchanged from previous note.  ALLERGIES:  is allergic to sulfa antibiotics.  MEDICATIONS:  Current Outpatient Prescriptions  Medication Sig Dispense Refill  . butalbital-acetaminophen-caffeine (FIORICET, ESGIC) 50-325-40 MG tablet Take 1 tablet by mouth every 12 (twelve) hours as needed for headache. 10 tablet 0  . dexamethasone (DECADRON) 4 MG tablet Take 1 tablet (4 mg total) by mouth daily. 1 tablet the day before chemotherapy on 1 tablet the day after chemotherapy 10 tablet 0  . diphenhydrAMINE (SLEEP AID, DIPHENHYDRAMINE,) 25 MG tablet Take 25 mg by mouth at bedtime.    Marland Kitchen  HYDROcodone-acetaminophen (NORCO/VICODIN) 5-325 MG tablet Take 1-2 tablets by mouth every 4 (four) hours as needed for moderate pain or severe pain. 10 tablet 0  . lidocaine-prilocaine (EMLA) cream Apply to affected area once  30 g 3  . LORazepam (ATIVAN) 0.5 MG tablet Take 1 tablet (0.5 mg total) by mouth every 6 (six) hours as needed (Nausea or vomiting). 30 tablet 0  . Minocycline HCl (SOLODYN) 80 MG TB24 Take 1 tablet by mouth 2 (two) times a week. 1-2 tablets per week     . ondansetron (ZOFRAN) 8 MG tablet Take 1 tablet (8 mg total) by mouth 2 (two) times daily as needed for refractory nausea / vomiting. Start on day 3 after chemo. 30 tablet 1  . prochlorperazine (COMPAZINE) 10 MG tablet Take 1 tablet (10 mg total) by mouth every 6 (six) hours as needed (Nausea or vomiting). 30 tablet 1  . venlafaxine XR (EFFEXOR-XR) 37.5 MG 24 hr capsule Take 1 capsule (37.5 mg total) by mouth daily with breakfast. 30 capsule 3   No current facility-administered medications for this visit.    Facility-Administered Medications Ordered in Other Visits  Medication Dose Route Frequency Provider Last Rate Last Dose  . cyclophosphamide (CYTOXAN) 1,060 mg in sodium chloride 0.9 % 250 mL chemo infusion  600 mg/m2 (Treatment Plan Recorded) Intravenous Once Nicholas Lose, MD      . dexamethasone (DECADRON) injection 10 mg  10 mg Intravenous Once Nicholas Lose, MD   10 mg at 02/01/17 1407  . DOCEtaxel (TAXOTERE) 130 mg in dextrose 5 % 250 mL chemo infusion  75 mg/m2 (Treatment Plan Recorded) Intravenous Once Nicholas Lose, MD      . heparin lock flush 100 unit/mL  500 Units Intracatheter Once PRN Nicholas Lose, MD      . pegfilgrastim (NEULASTA ONPRO KIT) injection 6 mg  6 mg Subcutaneous Once Nicholas Lose, MD      . sodium chloride flush (NS) 0.9 % injection 10 mL  10 mL Intracatheter PRN Nicholas Lose, MD        PHYSICAL EXAMINATION: ECOG PERFORMANCE STATUS: 1 - Symptomatic but completely ambulatory  Vitals:   02/01/17 1155  BP: 126/80  Pulse: 82  Resp: 18  Temp: 98.2 F (36.8 C)   Filed Weights   02/01/17 1155  Weight: 148 lb (67.1 kg)    GENERAL:alert, no distress and comfortable SKIN: skin color, texture, turgor are  normal, no rashes or significant lesions EYES: normal, Conjunctiva are pink and non-injected, sclera clear OROPHARYNX:no exudate, no erythema and lips, buccal mucosa, and tongue normal  NECK: supple, thyroid normal size, non-tender, without nodularity LYMPH:  no palpable lymphadenopathy in the cervical, axillary or inguinal LUNGS: clear to auscultation and percussion with normal breathing effort HEART: regular rate & rhythm and no murmurs and no lower extremity edema ABDOMEN:abdomen soft, non-tender and normal bowel sounds MUSCULOSKELETAL:no cyanosis of digits and no clubbing  NEURO: alert & oriented x 3 with fluent speech, no focal motor/sensory deficits EXTREMITIES: No lower extremity edema  LABORATORY DATA:  I have reviewed the data as listed   Chemistry      Component Value Date/Time   NA 142 02/01/2017 1111   K 3.6 02/01/2017 1111   CL 101 12/10/2016 0841   CO2 26 02/01/2017 1111   BUN 13.5 02/01/2017 1111   CREATININE 0.7 02/01/2017 1111      Component Value Date/Time   CALCIUM 9.9 02/01/2017 1111   ALKPHOS 85 02/01/2017 1111   AST 25 02/01/2017 1111  ALT 34 02/01/2017 1111   BILITOT 0.74 02/01/2017 1111       Lab Results  Component Value Date   WBC 9.5 02/01/2017   HGB 11.1 (L) 02/01/2017   HCT 32.5 (L) 02/01/2017   MCV 93.9 02/01/2017   PLT 390 02/01/2017   NEUTROABS 6.4 02/01/2017    ASSESSMENT & PLAN:  Malignant neoplasm of upper-inner quadrant of right breast in female, estrogen receptor positive ([facility]) Right lumpectomy: IDC with DCIS, 1 cm, margins negative, 0/4 lymph nodes, ER 100%, PR 95%, Ki-67 30%, HER-2 negative ratio 1.31, T1 BN 0 stage IA Oncotype DX score 27: 18% risk of recurrence with tamoxifen alone intermediate risk  Oncotype counseling:I discussed with her the details of Oncotype DX test result which showed that she had a high intermediate risk of recurrence score of 27. This translates into 18% risk of recurrence with tamoxifen  alone.  Recommendation: 1. I recommended adjuvant chemotherapy with Taxotere and Cytoxan every 3 weeks 4  started 12/21/2016 2. radiation therapy followed by 3. Adjuvant antiestrogen therapy ---------------------------------------------------------------------------------------------------------------------------------------- Current treatment: Cycle 3 Taxotere and Cytoxan Chemotherapy toxicities: 1. Headaches Monitoring closely for chemotherapy toxicities Labs were reviewed Return to clinic in 3 weeks for cycle 4  I spent 25 minutes talking to the patient of which more than half was spent in counseling and coordination of care.  No orders of the defined types were placed in this encounter.  The patient has a good understanding of the overall plan. she agrees with it. she will call with any problems that may develop before the next visit here.   Rulon Eisenmenger, MD 02/01/17

## 2017-02-01 NOTE — Assessment & Plan Note (Signed)
Right lumpectomy: IDC with DCIS, 1 cm, margins negative, 0/4 lymph nodes, ER 100%, PR 95%, Ki-67 30%, HER-2 negative ratio 1.31, T1 BN 0 stage IA Oncotype DX score 27: 18% risk of recurrence with tamoxifen alone intermediate risk  Oncotype counseling:I discussed with her the details of Oncotype DX test result which showed that she had a high intermediate risk of recurrence score of 27. This translates into 18% risk of recurrence with tamoxifen alone.  Recommendation: 1. I recommended adjuvant chemotherapy with Taxotere and Cytoxan every 3 weeks 4  started 12/21/2016 2. radiation therapy followed by 3. Adjuvant antiestrogen therapy ---------------------------------------------------------------------------------------------------------------------------------------- Current treatment: Cycle 3 Taxotere and Cytoxan Chemotherapy toxicities: 1. Headaches Monitoring closely for chemotherapy toxicities Labs were reviewed Return to clinic in 3 weeks for cycle 4

## 2017-02-18 DIAGNOSIS — Z9889 Other specified postprocedural states: Secondary | ICD-10-CM

## 2017-02-18 HISTORY — DX: Other specified postprocedural states: Z98.890

## 2017-02-21 NOTE — Addendum Note (Signed)
Addendum  created 02/21/17 1247 by Ulrich Soules, MD   Sign clinical note    

## 2017-02-22 ENCOUNTER — Ambulatory Visit: Payer: 59

## 2017-02-22 ENCOUNTER — Ambulatory Visit (HOSPITAL_BASED_OUTPATIENT_CLINIC_OR_DEPARTMENT_OTHER): Payer: 59

## 2017-02-22 ENCOUNTER — Other Ambulatory Visit (HOSPITAL_BASED_OUTPATIENT_CLINIC_OR_DEPARTMENT_OTHER): Payer: 59

## 2017-02-22 ENCOUNTER — Encounter: Payer: Self-pay | Admitting: Hematology and Oncology

## 2017-02-22 ENCOUNTER — Encounter: Payer: Self-pay | Admitting: *Deleted

## 2017-02-22 ENCOUNTER — Ambulatory Visit (HOSPITAL_BASED_OUTPATIENT_CLINIC_OR_DEPARTMENT_OTHER): Payer: 59 | Admitting: Hematology and Oncology

## 2017-02-22 DIAGNOSIS — C50211 Malignant neoplasm of upper-inner quadrant of right female breast: Secondary | ICD-10-CM

## 2017-02-22 DIAGNOSIS — Z5111 Encounter for antineoplastic chemotherapy: Secondary | ICD-10-CM

## 2017-02-22 DIAGNOSIS — Z95828 Presence of other vascular implants and grafts: Secondary | ICD-10-CM

## 2017-02-22 DIAGNOSIS — Z5189 Encounter for other specified aftercare: Secondary | ICD-10-CM | POA: Diagnosis not present

## 2017-02-22 DIAGNOSIS — Z170 Estrogen receptor positive status [ER+]: Principal | ICD-10-CM

## 2017-02-22 LAB — COMPREHENSIVE METABOLIC PANEL
ALT: 31 U/L (ref 0–55)
ANION GAP: 10 meq/L (ref 3–11)
AST: 25 U/L (ref 5–34)
Albumin: 4.1 g/dL (ref 3.5–5.0)
Alkaline Phosphatase: 81 U/L (ref 40–150)
BUN: 15 mg/dL (ref 7.0–26.0)
CALCIUM: 9.8 mg/dL (ref 8.4–10.4)
CHLORIDE: 103 meq/L (ref 98–109)
CO2: 27 meq/L (ref 22–29)
CREATININE: 0.8 mg/dL (ref 0.6–1.1)
Glucose: 90 mg/dl (ref 70–140)
Potassium: 3.8 mEq/L (ref 3.5–5.1)
Sodium: 140 mEq/L (ref 136–145)
Total Bilirubin: 0.44 mg/dL (ref 0.20–1.20)
Total Protein: 6.6 g/dL (ref 6.4–8.3)

## 2017-02-22 LAB — CBC WITH DIFFERENTIAL/PLATELET
BASO%: 1 % (ref 0.0–2.0)
Basophils Absolute: 0.1 10*3/uL (ref 0.0–0.1)
EOS ABS: 0 10*3/uL (ref 0.0–0.5)
EOS%: 0.5 % (ref 0.0–7.0)
HCT: 29.8 % — ABNORMAL LOW (ref 34.8–46.6)
HGB: 10.3 g/dL — ABNORMAL LOW (ref 11.6–15.9)
LYMPH%: 24.8 % (ref 14.0–49.7)
MCH: 32.9 pg (ref 25.1–34.0)
MCHC: 34.5 g/dL (ref 31.5–36.0)
MCV: 95.4 fL (ref 79.5–101.0)
MONO#: 1 10*3/uL — ABNORMAL HIGH (ref 0.1–0.9)
MONO%: 11.5 % (ref 0.0–14.0)
NEUT%: 62.2 % (ref 38.4–76.8)
NEUTROS ABS: 5.5 10*3/uL (ref 1.5–6.5)
Platelets: 311 10*3/uL (ref 145–400)
RBC: 3.12 10*6/uL — AB (ref 3.70–5.45)
RDW: 16.4 % — ABNORMAL HIGH (ref 11.2–14.5)
WBC: 8.9 10*3/uL (ref 3.9–10.3)
lymph#: 2.2 10*3/uL (ref 0.9–3.3)

## 2017-02-22 MED ORDER — DOCETAXEL CHEMO INJECTION 160 MG/16ML
75.0000 mg/m2 | Freq: Once | INTRAVENOUS | Status: AC
  Administered 2017-02-22: 130 mg via INTRAVENOUS
  Filled 2017-02-22: qty 13

## 2017-02-22 MED ORDER — SODIUM CHLORIDE 0.9 % IV SOLN
Freq: Once | INTRAVENOUS | Status: AC
  Administered 2017-02-22: 12:00:00 via INTRAVENOUS

## 2017-02-22 MED ORDER — PALONOSETRON HCL INJECTION 0.25 MG/5ML
0.2500 mg | Freq: Once | INTRAVENOUS | Status: AC
  Administered 2017-02-22: 0.25 mg via INTRAVENOUS

## 2017-02-22 MED ORDER — PEGFILGRASTIM 6 MG/0.6ML ~~LOC~~ PSKT
6.0000 mg | PREFILLED_SYRINGE | Freq: Once | SUBCUTANEOUS | Status: AC
  Administered 2017-02-22: 6 mg via SUBCUTANEOUS
  Filled 2017-02-22: qty 0.6

## 2017-02-22 MED ORDER — DEXAMETHASONE SODIUM PHOSPHATE 10 MG/ML IJ SOLN
10.0000 mg | Freq: Once | INTRAMUSCULAR | Status: AC
  Administered 2017-02-22: 10 mg via INTRAVENOUS

## 2017-02-22 MED ORDER — DEXAMETHASONE SODIUM PHOSPHATE 10 MG/ML IJ SOLN
INTRAMUSCULAR | Status: AC
  Filled 2017-02-22: qty 1

## 2017-02-22 MED ORDER — SODIUM CHLORIDE 0.9% FLUSH
10.0000 mL | INTRAVENOUS | Status: DC | PRN
  Administered 2017-02-22: 10 mL via INTRAVENOUS
  Filled 2017-02-22: qty 10

## 2017-02-22 MED ORDER — HEPARIN SOD (PORK) LOCK FLUSH 100 UNIT/ML IV SOLN
500.0000 [IU] | Freq: Once | INTRAVENOUS | Status: AC | PRN
  Administered 2017-02-22: 500 [IU]
  Filled 2017-02-22: qty 5

## 2017-02-22 MED ORDER — SODIUM CHLORIDE 0.9% FLUSH
10.0000 mL | INTRAVENOUS | Status: DC | PRN
  Administered 2017-02-22: 10 mL
  Filled 2017-02-22: qty 10

## 2017-02-22 MED ORDER — SODIUM CHLORIDE 0.9 % IV SOLN
600.0000 mg/m2 | Freq: Once | INTRAVENOUS | Status: AC
  Administered 2017-02-22: 1060 mg via INTRAVENOUS
  Filled 2017-02-22: qty 53

## 2017-02-22 MED ORDER — PALONOSETRON HCL INJECTION 0.25 MG/5ML
INTRAVENOUS | Status: AC
  Filled 2017-02-22: qty 5

## 2017-02-22 NOTE — Assessment & Plan Note (Signed)
Right lumpectomy: IDC with DCIS, 1 cm, margins negative, 0/4 lymph nodes, ER 100%, PR 95%, Ki-67 30%, HER-2 negative ratio 1.31, T1 BN 0 stage IA Oncotype DX score 27: 18% risk of recurrence with tamoxifen alone intermediate risk  Oncotype counseling:I discussed with her the details of Oncotype DX test result which showed that she had a high intermediate risk of recurrence score of 27. This translates into 18% risk of recurrence with tamoxifen alone.  Recommendation: 1. I recommended adjuvant chemotherapy with Taxotere and Cytoxan every 3 weeks 4 started 12/21/2016 2. radiation therapy followed by 3. Adjuvant antiestrogen therapy ---------------------------------------------------------------------------------------------------------------------------------------- Current treatment: Cycle 4 Taxotere and Cytoxan Chemotherapy toxicities: 1. Headaches Monitoring closely for chemotherapy toxicities Labs were reviewed Return to clinic after radiation therapy to start antiestrogen therapy

## 2017-02-22 NOTE — Patient Instructions (Signed)
[facility] Discharge Instructions for Patients Receiving Chemotherapy  Today you received the following chemotherapy agents;  Taxotere and Cytoxan.    To help prevent nausea and vomiting after your treatment, we encourage you to take your nausea medication as directed.     If you develop nausea and vomiting that is not controlled by your nausea medication, call the clinic.   BELOW ARE SYMPTOMS THAT SHOULD BE REPORTED IMMEDIATELY:  *FEVER GREATER THAN 100.5 F  *CHILLS WITH OR WITHOUT FEVER  NAUSEA AND VOMITING THAT IS NOT CONTROLLED WITH YOUR NAUSEA MEDICATION  *UNUSUAL SHORTNESS OF BREATH  *UNUSUAL BRUISING OR BLEEDING  TENDERNESS IN MOUTH AND THROAT WITH OR WITHOUT PRESENCE OF ULCERS  *URINARY PROBLEMS  *BOWEL PROBLEMS  UNUSUAL RASH Items with * indicate a potential emergency and should be followed up as soon as possible.  Feel free to call the clinic you have any questions or concerns. The clinic phone number is [phone].  Please show the CHEMO ALERT CARD at check-in to the Emergency Department and triage nurse.   

## 2017-02-22 NOTE — Progress Notes (Signed)
Patient Care Team: Patient, No Pcp Per as PCP - General (General Practice) Lovell Sheehan, NP as Nurse Practitioner (Nurse Practitioner) Jovita Kussmaul, MD as Consulting Physician (General Surgery) Nicholas Lose, MD as Consulting Physician (Hematology and Oncology) Kyung Rudd, MD as Consulting Physician (Radiation Oncology)  DIAGNOSIS:  Encounter Diagnosis  Name Primary?  . Malignant neoplasm of upper-inner quadrant of right breast in female, estrogen receptor positive ([place])     SUMMARY OF ONCOLOGIC HISTORY:   Malignant neoplasm of upper-inner quadrant of right breast in female, estrogen receptor positive ([place])   10/20/2016 Initial Diagnosis    Right breast biopsy 12:30 position: IDC grade 3, ER 100%, PR 95%, Ki-67 30%, HER-2 negative ratio 1.31, screening detected right breast asymmetry and calcifications 1.1 cm, right axillary borderline enlarged lymph node biopsy benign, T1c N0 stage IA clinical stage      11/18/2016 Surgery    Right lumpectomy: IDC with DCIS, 1 cm, margins negative, 0/4 lymph nodes, ER 100%, PR 95%, Ki-67 30%, HER-2 negative ratio 1.31, T1 BN 0 stage IA       11/25/2016 Oncotype testing    Oncotype DX score 27: 18% risk of recurrence with tamoxifen alone intermediate risk      12/21/2016 -  Adjuvant Chemotherapy    Taxotere and Cytoxan x 4 cycles       Genetic testing   11/16/2016 Initial Diagnosis    Genetic testing was negative for mutations in the 43 genes on [facility]'s Common Cancers panel (APC, ATM, AXIN2, BARD1, BMPR1A, BRCA1, BRCA2, BRIP1, CDH1, CDKN2A, CHEK2, DICER1, EPCAM, GREM1, HOXB13, KIT, MEN1, MLH1, MSH2, MSH6, MUTYH, NBN, NF1, PALB2, PDGFRA, PMS2, POLD1, POLE, PTEN, RAD50, RAD51C, RAD51D, SDHA, SDHB, SDHC, SDHD, SMAD4, SMARCA4, STK11, TP53, TSC1, TSC2, VHL).       CHIEF COMPLIANT: Cycle 4 Taxotere and Cytoxan  INTERVAL HISTORY: Lisa Anthony is a 51 year old with above-mentioned history of right breast cancer who is currently on adjuvant  chemotherapy with Taxotere and Cytoxan. Today is cycle 4. She had headaches after cycle 3 but then resolved. Denies any nausea vomiting. Does not have neuropathy. Nails appear intact.  REVIEW OF SYSTEMS:   Constitutional: Denies fevers, chills or abnormal weight loss Eyes: Denies blurriness of vision Ears, nose, mouth, throat, and face: Denies mucositis or sore throat Respiratory: Denies cough, dyspnea or wheezes Cardiovascular: Denies palpitation, chest discomfort Gastrointestinal:  Denies nausea, heartburn or change in bowel habits Skin: Denies abnormal skin rashes Lymphatics: Denies new lymphadenopathy or easy bruising Neurological:Denies numbness, tingling or new weaknesses Behavioral/Psych: Mood is stable, no new changes  Extremities: No lower extremity edema Breast:  denies any pain or lumps or nodules in either breasts All other systems were reviewed with the patient and are negative.  I have reviewed the past medical history, past surgical history, social history and family history with the patient and they are unchanged from previous note.  ALLERGIES:  is allergic to sulfa antibiotics.  MEDICATIONS:  Current Outpatient Prescriptions  Medication Sig Dispense Refill  . butalbital-acetaminophen-caffeine (FIORICET, ESGIC) 50-325-40 MG tablet Take 1 tablet by mouth every 12 (twelve) hours as needed for headache. 10 tablet 0  . dexamethasone (DECADRON) 4 MG tablet Take 1 tablet (4 mg total) by mouth daily. 1 tablet the day before chemotherapy on 1 tablet the day after chemotherapy 10 tablet 0  . diphenhydrAMINE (SLEEP AID, DIPHENHYDRAMINE,) 25 MG tablet Take 25 mg by mouth at bedtime.    Marland Kitchen HYDROcodone-acetaminophen (NORCO/VICODIN) 5-325 MG tablet Take 1-2 tablets by mouth  every 4 (four) hours as needed for moderate pain or severe pain. 10 tablet 0  . lidocaine-prilocaine (EMLA) cream Apply to affected area once 30 g 3  . LORazepam (ATIVAN) 0.5 MG tablet Take 1 tablet (0.5 mg total) by  mouth every 6 (six) hours as needed (Nausea or vomiting). 30 tablet 0  . Minocycline HCl (SOLODYN) 80 MG TB24 Take 1 tablet by mouth 2 (two) times a week. 1-2 tablets per week     . ondansetron (ZOFRAN) 8 MG tablet Take 1 tablet (8 mg total) by mouth 2 (two) times daily as needed for refractory nausea / vomiting. Start on day 3 after chemo. 30 tablet 1  . prochlorperazine (COMPAZINE) 10 MG tablet Take 1 tablet (10 mg total) by mouth every 6 (six) hours as needed (Nausea or vomiting). 30 tablet 1  . venlafaxine XR (EFFEXOR-XR) 37.5 MG 24 hr capsule Take 1 capsule (37.5 mg total) by mouth daily with breakfast. 30 capsule 3   No current facility-administered medications for this visit.    Facility-Administered Medications Ordered in Other Visits  Medication Dose Route Frequency Provider Last Rate Last Dose  . heparin lock flush 100 unit/mL  500 Units Intracatheter Once PRN Nicholas Lose, MD      . pegfilgrastim (NEULASTA ONPRO KIT) injection 6 mg  6 mg Subcutaneous Once Nicholas Lose, MD      . sodium chloride flush (NS) 0.9 % injection 10 mL  10 mL Intracatheter PRN Nicholas Lose, MD        PHYSICAL EXAMINATION: ECOG PERFORMANCE STATUS: 0 - Asymptomatic  Vitals:   02/22/17 1032  BP: 125/75  Pulse: 79  Resp: 18  Temp: 98.2 F (36.8 C)   Filed Weights   02/22/17 1032  Weight: 157 lb 11.2 oz (71.5 kg)    GENERAL:alert, no distress and comfortable SKIN: skin color, texture, turgor are normal, no rashes or significant lesions EYES: normal, Conjunctiva are pink and non-injected, sclera clear OROPHARYNX:no exudate, no erythema and lips, buccal mucosa, and tongue normal  NECK: supple, thyroid normal size, non-tender, without nodularity LYMPH:  no palpable lymphadenopathy in the cervical, axillary or inguinal LUNGS: clear to auscultation and percussion with normal breathing effort HEART: regular rate & rhythm and no murmurs and no lower extremity edema ABDOMEN:abdomen soft, non-tender and  normal bowel sounds MUSCULOSKELETAL:no cyanosis of digits and no clubbing  NEURO: alert & oriented x 3 with fluent speech, no focal motor/sensory deficits EXTREMITIES: No lower extremity edema  LABORATORY DATA:  I have reviewed the data as listed   Chemistry      Component Value Date/Time   NA 140 02/22/2017 0939   K 3.8 02/22/2017 0939   CL 101 12/10/2016 0841   CO2 27 02/22/2017 0939   BUN 15.0 02/22/2017 0939   CREATININE 0.8 02/22/2017 0939      Component Value Date/Time   CALCIUM 9.8 02/22/2017 0939   ALKPHOS 81 02/22/2017 0939   AST 25 02/22/2017 0939   ALT 31 02/22/2017 0939   BILITOT 0.44 02/22/2017 0939       Lab Results  Component Value Date   WBC 8.9 02/22/2017   HGB 10.3 (L) 02/22/2017   HCT 29.8 (L) 02/22/2017   MCV 95.4 02/22/2017   PLT 311 02/22/2017   NEUTROABS 5.5 02/22/2017    ASSESSMENT & PLAN:  Malignant neoplasm of upper-inner quadrant of right breast in female, estrogen receptor positive ([place]) Right lumpectomy: IDC with DCIS, 1 cm, margins negative, 0/4 lymph nodes, ER 100%, PR  95%, Ki-67 30%, HER-2 negative ratio 1.31, T1 BN 0 stage IA Oncotype DX score 27: 18% risk of recurrence with tamoxifen alone intermediate risk  Oncotype counseling:I discussed with her the details of Oncotype DX test result which showed that she had a high intermediate risk of recurrence score of 27. This translates into 18% risk of recurrence with tamoxifen alone.  Recommendation: 1. I recommended adjuvant chemotherapy with Taxotere and Cytoxan every 3 weeks 4 started 12/21/2016 2. radiation therapy followed by 3. Adjuvant antiestrogen therapy ---------------------------------------------------------------------------------------------------------------------------------------- Current treatment: Cycle 4 Taxotere and Cytoxan Chemotherapy toxicities: 1. Headaches Monitoring closely for chemotherapy toxicities Labs were reviewed Return to clinic after radiation  therapy to start antiestrogen therapy   I spent 25 minutes talking to the patient of which more than half was spent in counseling and coordination of care.  No orders of the defined types were placed in this encounter.  The patient has a good understanding of the overall plan. she agrees with it. she will call with any problems that may develop before the next visit here.   Rulon Eisenmenger, MD 02/22/17

## 2017-02-28 ENCOUNTER — Ambulatory Visit: Payer: Self-pay | Admitting: General Surgery

## 2017-03-01 ENCOUNTER — Encounter: Payer: Self-pay | Admitting: Radiation Oncology

## 2017-03-01 NOTE — Progress Notes (Addendum)
Location of Breast Cancer: Upper-inner quadrant of right breast in female  Histology per Pathology Report: 11/18/2016   Receptor Status: ER(+), PR (+), Her2-neu (-), Ki-(30%)  Did patient present with symptoms (if so, please note symptoms) or was this found on screening mammography?: Routine screening  Past/Anticipated interventions by surgeon, if any:11/18/2016 Procedure: BREAST LUMPECTOMY WITH RADIOACTIVE SEED AND SENTINEL LYMPH NODE BIOPSY;  Surgeon: Autumn Messing III, MD;  Location: [facility];  Service: General;  Laterality: Right;  Past/Anticipated interventions by medical oncology, if any: Chemotherapy   12/21/2016 - 02-22-17 Adjuvant Chemotherapy    Taxotere and Cytoxan x 4 cycles      Lymphedema issues, if any:  No  Pain issues, if any: No  SAFETY ISSUES:  Prior radiation?No  Pacemaker/ICD? No  Possible current pregnancy?No  Is the patient on methotrexate?No Menarche 43, G3P3, BC 10 years, Menopause 50  Current Complaints / other details:  Patient is a non smoker, occasional alcohol use, divorced, lives in [place] with 3 children.  Works in IT sales professional at [employer].  Pertinent Health History: Anxiety  Completed chemotherapy 02-22-17 will have port-a-cath removed Monday, 03-14-17. Wt Readings from Last 3 Encounters:  03/10/17 153 lb 6.4 oz (69.6 kg)  02/22/17 157 lb 11.2 oz (71.5 kg)  02/01/17 148 lb (67.1 kg)  BP 122/87   Pulse 96   Temp 98.6 F (37 C) (Oral)   Resp 18   Ht '5\' 3"'  (1.6 m)   Wt 153 lb 6.4 oz (69.6 kg)   SpO2 99%   BMI 27.17 kg/m

## 2017-03-08 ENCOUNTER — Encounter (HOSPITAL_BASED_OUTPATIENT_CLINIC_OR_DEPARTMENT_OTHER): Payer: Self-pay | Admitting: *Deleted

## 2017-03-08 ENCOUNTER — Encounter: Payer: Self-pay | Admitting: *Deleted

## 2017-03-10 ENCOUNTER — Ambulatory Visit
Admission: RE | Admit: 2017-03-10 | Discharge: 2017-03-10 | Disposition: A | Discharge status: Home / Self Care | Payer: 59 | Source: Ambulatory Visit | Attending: Radiation Oncology | Admitting: Radiation Oncology

## 2017-03-10 ENCOUNTER — Encounter: Payer: Self-pay | Admitting: Radiation Oncology

## 2017-03-10 VITALS — BP 122/87 | HR 96 | Temp 98.6°F | Resp 18 | Ht 63.0 in | Wt 153.4 lb

## 2017-03-10 DIAGNOSIS — Z9221 Personal history of antineoplastic chemotherapy: Secondary | ICD-10-CM | POA: Diagnosis not present

## 2017-03-10 DIAGNOSIS — C50211 Malignant neoplasm of upper-inner quadrant of right female breast: Secondary | ICD-10-CM

## 2017-03-10 DIAGNOSIS — Z9889 Other specified postprocedural states: Secondary | ICD-10-CM | POA: Diagnosis not present

## 2017-03-10 DIAGNOSIS — Z170 Estrogen receptor positive status [ER+]: Secondary | ICD-10-CM | POA: Diagnosis not present

## 2017-03-10 NOTE — Addendum Note (Signed)
Encounter addended by: Malena Edman, RN on: 03/10/2017  2:37 PM<BR>    Actions taken: Charge Capture section accepted

## 2017-03-10 NOTE — Progress Notes (Signed)
Pt given Boost drink, instructed to drink day of surgery by 1200 with teach back method.

## 2017-03-10 NOTE — Progress Notes (Signed)
Radiation Oncology         [phone] ________________________________  Name: Lisa Anthony MRN: [id number]  Date: 03/10/2017  DOB: 1966/09/14  VE:LFYBOFB, No Pcp Per  Lisa Lose, MD     REFERRING PHYSICIAN: Nicholas Lose, MD   DIAGNOSIS: The encounter diagnosis was Malignant neoplasm of upper-inner quadrant of right breast in female, estrogen receptor positive ([place]).   HISTORY OF PRESENT ILLNESS: Lisa Anthony is a 51 y.o. female with right breast cancer originially seen in February in the multidisciplinary breast clinic. She presented with asymmetry and calcifications on recent screening mammogram.  Further evalution with diagnostic mammogram revealed a 1.1x0.8x1cm mass in the right breast with a borderline node.  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  Axillary lymph node biopsy performed on 10/25/16 revealed a benign reactive lymph node without evidence of malignancy. She subsequently underwent right lumpectomy on 11/18/16 with SLN assessment. Final pathology revealed invasive ductal carcinoma with DCIS, the tumor measured 1 cm, and all of her 4 nodes sampled were negative for cancer. Her tumor was classified as a T1bN0, and oncotype was pursued. Her score was high risk at 87, and she proceeded with adjuvant chemotherapy with Taxotere and Cytoxan between 12/21/16 and 02/22/17. She comes today to review options for adjuvant radiotherapy, and will also proceed with antiestrogen therapy following radiation.  PREVIOUS RADIATION THERAPY: No   PAST MEDICAL HISTORY:  Past Medical History:  Diagnosis Date  . Anxiety   . Breast cancer ([place])    right  . History of kidney stones    yrs ago        PAST SURGICAL HISTORY: Past Surgical History:  Procedure Laterality Date  . BREAST LUMPECTOMY WITH RADIOACTIVE SEED AND SENTINEL LYMPH NODE BIOPSY Right 11/18/2016   Procedure: BREAST  LUMPECTOMY WITH RADIOACTIVE SEED AND SENTINEL LYMPH NODE BIOPSY;  Surgeon: Autumn Messing III, MD;  Location: [facility];  Service: General;  Laterality: Right;  . HERNIA REPAIR    . PORTACATH PLACEMENT N/A 12/13/2016   Procedure: INSERTION PORT-A-CATH;  Surgeon: Autumn Messing III, MD;  Location: [facility] OR;  Service: General;  Laterality: N/A;     FAMILY HISTORY:  Family History  Problem Relation Age of Onset  . Breast cancer Maternal Grandmother 65       bilateral  . Breast cancer Cousin 16     SOCIAL HISTORY:  reports that she quit smoking about 18 years ago. She has never used smokeless tobacco. She reports that she drinks alcohol. She reports that she does not use drugs. She is divorced and lives in [place], [place] with her 3 children.  She works in the Conservator, museum/gallery at [employer].    ALLERGIES: Sulfa antibiotics   MEDICATIONS:  Current Outpatient Prescriptions  Medication Sig Dispense Refill  . Minocycline HCl (SOLODYN) 80 MG TB24 Take 1 tablet by mouth 2 (two) times a week. 1-2 tablets per week     . venlafaxine XR (EFFEXOR-XR) 37.5 MG 24 hr capsule Take 1 capsule (37.5 mg total) by mouth daily with breakfast. 30 capsule 3   No current facility-administered medications for this encounter.      REVIEW OF SYSTEMS: On review of systems, the patient reports that she is doing well overall. She tolerated chemotherapy well. She denies any chest pain, shortness of breath, cough, fevers, chills, night sweats, unintended weight changes. She denies any bowel or bladder disturbances, and denies  abdominal pain, nausea or vomiting. She denies any new musculoskeletal or joint aches or pains. A complete review of systems is obtained and is otherwise negative.    PHYSICAL EXAM:  Wt Readings from Last 3 Encounters:  03/10/17 153 lb 6.4 oz (69.6 kg)  02/22/17 157 lb 11.2 oz (71.5 kg)  02/01/17 148 lb (67.1 kg)   Temp Readings from Last 3 Encounters:  03/10/17 98.6 F (37 C) (Oral)    02/22/17 98.2 F (36.8 C) (Oral)  02/01/17 98.2 F (36.8 C) (Oral)   BP Readings from Last 3 Encounters:  03/10/17 122/87  02/22/17 125/75  02/01/17 126/80   Pulse Readings from Last 3 Encounters:  03/10/17 96  02/22/17 79  02/01/17 82    In general this is a well appearing Caucasian female in no acute distress. She is alert and oriented x4 and appropriate throughout the examination. HEENT reveals that the patient is normocephalic, atraumatic. EOMs are intact. PERRLA. Skin is intact without any evidence of gross lesions. Cardiopulmonary assessment is negative for acute distress and she exhibits normal effort. The right breast exam is deferred to planning.  ECOG = 0  0 - Asymptomatic (Fully active, able to carry on all predisease activities without restriction)  1 - Symptomatic but completely ambulatory (Restricted in physically strenuous activity but ambulatory and able to carry out work of a light or sedentary nature. For example, light housework, office work)  2 - Symptomatic, <50% in bed during the day (Ambulatory and capable of all self care but unable to carry out any work activities. Up and about more than 50% of waking hours)  3 - Symptomatic, >50% in bed, but not bedbound (Capable of only limited self-care, confined to bed or chair 50% or more of waking hours)  4 - Bedbound (Completely disabled. Cannot carry on any self-care. Totally confined to bed or chair)  5 - Death   Eustace Pen MM, Creech RH, Tormey DC, et al. [phone]). "Toxicity and response criteria of the [facility]". [place] Oncol. 5 (6): 649-55    LABORATORY DATA:  Lab Results  Component Value Date   WBC 8.9 02/22/2017   HGB 10.3 (L) 02/22/2017   HCT 29.8 (L) 02/22/2017   MCV 95.4 02/22/2017   PLT 311 02/22/2017   Lab Results  Component Value Date   NA 140 02/22/2017   K 3.8 02/22/2017   CL 101 12/10/2016   CO2 27 02/22/2017   Lab Results  Component Value Date   ALT 31  02/22/2017   AST 25 02/22/2017   ALKPHOS 81 02/22/2017   BILITOT 0.44 02/22/2017      RADIOGRAPHY: No results found.     IMPRESSION/PLAN: 1.  Stage IA, pT1c N0 Mx, ER/PR positive, grade 3 invasive ductal carcinoma of the right breast. Dr. Lisbeth Renshaw reviews the patient's final pathology and outlines that she would benefit from adjuvant radiotherapy for local control and to prevent disease recurrence. He offers the patient external radiotherapy to the right breast x 6 1/2 weeks followed by antiestrogen therapy under the care of Dr. Lindi Adie. We discussed the risks, benefits, short, and long term effects of radiotherapy, and the patient is interested in proceeding. Dr. Lisbeth Renshaw discusses the delivery and logistics of radiotherapy. Written consent is obtained and placed in the chart, a copy was provided to the patient. She will be set up for simulation next week and to start the week of 03/28/17.  In a visit lasting 25 minutes, greater than 50% of  the time was spent face to face discussing logistics of therapy and her pathology, and coordinating the patient's care.   The above documentation reflects my direct findings during this shared patient visit. Please see the separate note by Dr. Lisbeth Renshaw on this date for the remainder of the patient's plan of care.    Carola Rhine, PAC

## 2017-03-14 ENCOUNTER — Encounter (HOSPITAL_BASED_OUTPATIENT_CLINIC_OR_DEPARTMENT_OTHER)
Admission: RE | Disposition: A | Discharge status: Home / Self Care | Payer: Self-pay | Source: Ambulatory Visit | Attending: General Surgery

## 2017-03-14 ENCOUNTER — Ambulatory Visit (HOSPITAL_BASED_OUTPATIENT_CLINIC_OR_DEPARTMENT_OTHER): Payer: 59 | Admitting: Certified Registered"

## 2017-03-14 ENCOUNTER — Ambulatory Visit (HOSPITAL_BASED_OUTPATIENT_CLINIC_OR_DEPARTMENT_OTHER)
Admission: RE | Admit: 2017-03-14 | Discharge: 2017-03-14 | Disposition: A | Discharge status: Home / Self Care | Payer: 59 | Source: Ambulatory Visit | Attending: General Surgery | Admitting: General Surgery

## 2017-03-14 ENCOUNTER — Encounter (HOSPITAL_BASED_OUTPATIENT_CLINIC_OR_DEPARTMENT_OTHER): Payer: Self-pay | Admitting: *Deleted

## 2017-03-14 DIAGNOSIS — Z79899 Other long term (current) drug therapy: Secondary | ICD-10-CM | POA: Insufficient documentation

## 2017-03-14 DIAGNOSIS — Z87891 Personal history of nicotine dependence: Secondary | ICD-10-CM | POA: Diagnosis not present

## 2017-03-14 DIAGNOSIS — C50411 Malignant neoplasm of upper-outer quadrant of right female breast: Secondary | ICD-10-CM | POA: Insufficient documentation

## 2017-03-14 DIAGNOSIS — C50211 Malignant neoplasm of upper-inner quadrant of right female breast: Secondary | ICD-10-CM | POA: Diagnosis not present

## 2017-03-14 DIAGNOSIS — Z452 Encounter for adjustment and management of vascular access device: Secondary | ICD-10-CM | POA: Diagnosis not present

## 2017-03-14 HISTORY — PX: PORT-A-CATH REMOVAL: SHX5289

## 2017-03-14 SURGERY — REMOVAL PORT-A-CATH
Anesthesia: General | Site: Chest | Laterality: Left

## 2017-03-14 MED ORDER — ONDANSETRON HCL 4 MG/2ML IJ SOLN
INTRAMUSCULAR | Status: DC | PRN
  Administered 2017-03-14: 4 mg via INTRAVENOUS

## 2017-03-14 MED ORDER — SCOPOLAMINE 1 MG/3DAYS TD PT72: 1.0000 | MEDICATED_PATCH | Freq: Once | TRANSDERMAL | Status: DC | PRN

## 2017-03-14 MED ORDER — HYDROCODONE-ACETAMINOPHEN 5-325 MG PO TABS: 1.0000 | ORAL_TABLET | ORAL | 0 refills | Status: DC | PRN

## 2017-03-14 MED ORDER — LACTATED RINGERS IV SOLN
INTRAVENOUS | Status: DC
  Administered 2017-03-14: 14:00:00 via INTRAVENOUS

## 2017-03-14 MED ORDER — MIDAZOLAM HCL 2 MG/2ML IJ SOLN
INTRAMUSCULAR | Status: AC
  Filled 2017-03-14: qty 2

## 2017-03-14 MED ORDER — LIDOCAINE HCL (CARDIAC) 20 MG/ML IV SOLN
INTRAVENOUS | Status: DC | PRN
Start: 2017-03-14 — End: 2017-03-14
  Administered 2017-03-14: 60 mg via INTRAVENOUS

## 2017-03-14 MED ORDER — BUPIVACAINE-EPINEPHRINE 0.25% -1:200000 IJ SOLN
INTRAMUSCULAR | Status: DC | PRN
  Administered 2017-03-14: 10 mL

## 2017-03-14 MED ORDER — PROPOFOL 10 MG/ML IV BOLUS
INTRAVENOUS | Status: DC | PRN
  Administered 2017-03-14: 200 mg via INTRAVENOUS

## 2017-03-14 MED ORDER — MIDAZOLAM HCL 2 MG/2ML IJ SOLN
1.0000 mg | INTRAMUSCULAR | Status: DC | PRN
  Administered 2017-03-14: 2 mg via INTRAVENOUS

## 2017-03-14 MED ORDER — FENTANYL CITRATE (PF) 100 MCG/2ML IJ SOLN
50.0000 ug | INTRAMUSCULAR | Status: DC | PRN
  Administered 2017-03-14 (×2): 50 ug via INTRAVENOUS

## 2017-03-14 MED ORDER — DEXAMETHASONE SODIUM PHOSPHATE 4 MG/ML IJ SOLN
INTRAMUSCULAR | Status: DC | PRN
  Administered 2017-03-14: 10 mg via INTRAVENOUS

## 2017-03-14 MED ORDER — FENTANYL CITRATE (PF) 100 MCG/2ML IJ SOLN
INTRAMUSCULAR | Status: AC
  Filled 2017-03-14: qty 2

## 2017-03-14 MED ORDER — CHLORHEXIDINE GLUCONATE CLOTH 2 % EX PADS: 6.0000 | MEDICATED_PAD | Freq: Once | CUTANEOUS | Status: DC

## 2017-03-14 SURGICAL SUPPLY — 31 items
BLADE SURG 15 STRL LF DISP TIS (BLADE) ×1 IMPLANT
BLADE SURG 15 STRL SS (BLADE) ×1
CHLORAPREP W/TINT 26ML (MISCELLANEOUS) ×2 IMPLANT
COVER BACK TABLE 60X90IN (DRAPES) ×2 IMPLANT
COVER MAYO STAND STRL (DRAPES) ×2 IMPLANT
DECANTER SPIKE VIAL GLASS SM (MISCELLANEOUS) ×2 IMPLANT
DERMABOND ADVANCED (GAUZE/BANDAGES/DRESSINGS) ×1
DERMABOND ADVANCED .7 DNX12 (GAUZE/BANDAGES/DRESSINGS) ×1 IMPLANT
DRAPE LAPAROTOMY 100X72 PEDS (DRAPES) ×2 IMPLANT
DRAPE UTILITY XL STRL (DRAPES) ×2 IMPLANT
ELECT COATED BLADE 2.86 ST (ELECTRODE) ×2 IMPLANT
ELECT REM PT RETURN 9FT ADLT (ELECTROSURGICAL) ×2
ELECTRODE REM PT RTRN 9FT ADLT (ELECTROSURGICAL) ×1 IMPLANT
GLOVE BIO SURGEON STRL SZ7.5 (GLOVE) ×2 IMPLANT
GLOVE BIOGEL PI IND STRL 7.0 (GLOVE) ×1 IMPLANT
GLOVE BIOGEL PI IND STRL 8 (GLOVE) ×1 IMPLANT
GLOVE BIOGEL PI INDICATOR 7.0 (GLOVE) ×1
GLOVE BIOGEL PI INDICATOR 8 (GLOVE) ×1
GLOVE ECLIPSE 6.5 STRL STRAW (GLOVE) ×2 IMPLANT
GOWN STRL REUS W/ TWL LRG LVL3 (GOWN DISPOSABLE) ×2 IMPLANT
GOWN STRL REUS W/TWL LRG LVL3 (GOWN DISPOSABLE) ×2
NEEDLE HYPO 25X1 1.5 SAFETY (NEEDLE) ×2 IMPLANT
PACK BASIN DAY SURGERY FS (CUSTOM PROCEDURE TRAY) ×2 IMPLANT
PENCIL BUTTON HOLSTER BLD 10FT (ELECTRODE) ×2 IMPLANT
SLEEVE SCD COMPRESS KNEE MED (MISCELLANEOUS) ×2 IMPLANT
SUT MON AB 4-0 PC3 18 (SUTURE) ×2 IMPLANT
SUT VIC AB 3-0 SH 27 (SUTURE) ×1
SUT VIC AB 3-0 SH 27X BRD (SUTURE) ×1 IMPLANT
SYR CONTROL 10ML LL (SYRINGE) ×2 IMPLANT
TOWEL OR 17X24 6PK STRL BLUE (TOWEL DISPOSABLE) ×2 IMPLANT
TOWEL OR NON WOVEN STRL DISP B (DISPOSABLE) ×2 IMPLANT

## 2017-03-14 NOTE — H&P (Signed)
  Lisa Anthony  Location: [facility] Surgery Patient #: [id number] DOB: 12/11/1965 Single / Language: Cleophus Molt / Race: White Female   History of Present Illness  The patient is a 51 year old female who presents for a follow-up for Breast cancer. The patient is a 51 year old white female who is about 2 months status post right breast lumpectomy and sentinel node mapping for a T1b N0 right breast cancer. She was ER and PR positive and HER-2 negative with a Ki-67 of 30%. Her Oncotype score was elevated at 27. She tolerated the surgery well. She did have a small seroma that was aspirated from the right axilla but this has not recurred. She has been receiving chemotherapy for the elevated Oncotype score and she will finish her chemotherapy in early June. She would like to have her port removed in late June. She otherwise feels well.   Allergies  Allergies Reconciled   Medication History  Butalbital-APAP-Caffeine (50-325-'40MG'$  Tablet, Oral) Active. Dexamethasone ('4MG'$  Tablet, Oral) Active. Ondansetron HCl ('8MG'$  Tablet, Oral) Active. Prochlorperazine Maleate ('10MG'$  Tablet, Oral) Active. Venlafaxine HCl ER (37.'5MG'$  Capsule ER 24HR, Oral) Active. LORazepam (0.'5MG'$  Tablet, Oral) Active. Lidocaine-Prilocaine (2.5-2.5% Cream, External) Active. Minocycline HCl ('100MG'$  Tablet, Oral) Active. Ativan ('1MG'$  Tablet, Oral) Active. Medications Reconciled  Vitals Weight: 150 lb Height: 66in Body Surface Area: 1.77 m Body Mass Index: 24.21 kg/m  Temp.: 97.43F  Pulse: 81 (Regular)  BP: 110/80 (Sitting, Left Arm, Standard)       Physical Exam  Breast Note: The right breast and axillary incisions have healed nicely with no sign of an infection or significant seroma. The port site is also healing well on the left chest wall.     Assessment & Plan  MALIGNANT NEOPLASM OF UPPER-OUTER QUADRANT OF RIGHT BREAST IN FEMALE, ESTROGEN RECEPTOR POSITIVE (C50.411) Impression:  The patient is about 2 months status post right breast lumpectomy for breast cancer. She tolerated surgery well. She has been receiving chemotherapy because of an Oncotype score that was elevated. This will finish up in early June. She will be ready to get her port out in late June. I have discussed with her in detail the risks and benefits of that operation as well as some of the technical aspects and she understands and wishes to proceed. Otherwise I will see her back in about 3 months for her scheduled breast follow-up. Current Plans Follow up with [place] in the office in 3 MONTHS.  Call us sooner as needed.

## 2017-03-14 NOTE — Anesthesia Procedure Notes (Signed)
Procedure Name: LMA Insertion Date/Time: 03/14/2017 3:44 PM Performed by: Lorin Gawron D Pre-anesthesia Checklist: Patient identified, Emergency Drugs available, Suction available and Patient being monitored Patient Re-evaluated:Patient Re-evaluated prior to inductionOxygen Delivery Method: Circle system utilized Preoxygenation: Pre-oxygenation with 100% oxygen Intubation Type: IV induction Ventilation: Mask ventilation without difficulty LMA: LMA inserted LMA Size: 3.0 Number of attempts: 1 Airway Equipment and Method: Bite block Placement Confirmation: positive ETCO2 Tube secured with: Tape Dental Injury: Teeth and Oropharynx as per pre-operative assessment

## 2017-03-14 NOTE — Discharge Instructions (Signed)

## 2017-03-14 NOTE — Op Note (Signed)
03/14/2017  4:15 PM  PATIENT:  Lisa Anthony  51 y.o. female  PRE-OPERATIVE DIAGNOSIS:  Right breast cancer  POST-OPERATIVE DIAGNOSIS:  Right breast cancer  PROCEDURE:  Procedure(s): REMOVAL PORT-A-CATH (Left)  SURGEON:  Surgeon(s) and Role:    * Jovita Kussmaul, MD - Primary  PHYSICIAN ASSISTANT:   ASSISTANTS: none   ANESTHESIA:   local and general  EBL:  Total I/O In: 800 [I.V.:800] Out: -   BLOOD ADMINISTERED:none  DRAINS: none   LOCAL MEDICATIONS USED:  MARCAINE     SPECIMEN:  No Specimen  DISPOSITION OF SPECIMEN:  N/A  COUNTS:  YES  TOURNIQUET:  * No tourniquets in log *  DICTATION: .Dragon Dictation   After informed consent was obtained the patient was brought to the operating room and placed in the supine position on the operating room table. After adequate induction of general anesthesia the patient's left chest was prepped with ChloraPrep, allowed to dry, and draped in usual sterile manner. An appropriate timeout was performed. The area around the port was infiltrated with quarter percent Marcaine. A small incision was made with a 15 blade knife through her previous incision. The incision was carried through the subcutaneous tissue sharply with the 15 blade knife until the capsule surrounding the port was opened. The 2 anchoring stitches were divided and removed. The port was then gently pushed out of its pocket and with gentle traction was removed from the patient without difficulty. Pressure was held for several minutes on the area until the area was completely hemostatic. The deep layer of the wound was then closed with interrupted 3-0 Vicryl stitches. The skin was then closed with a running 4-0 Monocryl subcuticular stitch. Dermabond dressings were applied. The patient tolerated the procedure well. At the end of the case all needle sponge and instrument counts were correct. The patient was then awakened and taken to recovery in stable condition.  PLAN OF  CARE: Discharge to home after PACU  PATIENT DISPOSITION:  PACU - hemodynamically stable.   Delay start of Pharmacological VTE agent (>24hrs) due to surgical blood loss or risk of bleeding: not applicable

## 2017-03-14 NOTE — Interval H&P Note (Signed)
History and Physical Interval Note:  03/14/2017 3:30 PM  Lisa Anthony  has presented today for surgery, with the diagnosis of Right breast cancer  The various methods of treatment have been discussed with the patient and family. After consideration of risks, benefits and other options for treatment, the patient has consented to  Procedure(s): REMOVAL PORT-A-CATH (N/A) as a surgical intervention .  The patient's history has been reviewed, patient examined, no change in status, stable for surgery.  I have reviewed the patient's chart and labs.  Questions were answered to the patient's satisfaction.     TOTH III,[place] Lesure S

## 2017-03-14 NOTE — Transfer of Care (Signed)
Immediate Anesthesia Transfer of Care Note  Patient: Lisa Anthony  Procedure(s) Performed: Procedure(s): REMOVAL PORT-A-CATH (Left)  Patient Location: PACU  Anesthesia Type:General  Level of Consciousness: awake, alert , oriented and patient cooperative  Airway & Oxygen Therapy: Patient Spontanous Breathing and Patient connected to face mask oxygen  Post-op Assessment: Report given to RN and Post -op Vital signs reviewed and stable  Post vital signs: Reviewed and stable  Last Vitals:  Vitals:   03/14/17 1411  BP: 117/86  Pulse: 88  Resp: 18  Temp: 36.9 C    Last Pain:  Vitals:   03/14/17 1411  TempSrc: Oral  PainSc: 0-No pain      Patients Stated Pain Goal: 0 (19/37/90 2409)  Complications: No apparent anesthesia complications

## 2017-03-14 NOTE — Anesthesia Preprocedure Evaluation (Signed)
Anesthesia Evaluation  Patient identified by MRN, date of birth, ID band Patient awake    Reviewed: Allergy & Precautions, H&P , NPO status , Patient's Chart, lab work & pertinent test results  History of Anesthesia Complications Negative for: history of anesthetic complications  Airway Mallampati: II  TM Distance: >3 FB Neck ROM: full    Dental  (+) Teeth Intact   Pulmonary neg shortness of breath, neg sleep apnea, neg COPD, neg recent URI, former smoker,    breath sounds clear to auscultation       Cardiovascular negative cardio ROS   Rhythm:regular Rate:Normal     Neuro/Psych PSYCHIATRIC DISORDERS Anxiety negative neurological ROS     GI/Hepatic negative GI ROS, Neg liver ROS,   Endo/Other  negative endocrine ROS  Renal/GU negative Renal ROS     Musculoskeletal   Abdominal   Peds  Hematology negative hematology ROS (+)   Anesthesia Other Findings   Reproductive/Obstetrics Breast CA                             Anesthesia Physical  Anesthesia Plan  ASA: II  Anesthesia Plan: General   Post-op Pain Management:    Induction: Intravenous  PONV Risk Score and Plan: 3 and Ondansetron, Dexamethasone, Propofol and Midazolam  Airway Management Planned: LMA  Additional Equipment: None  Intra-op Plan:   Post-operative Plan: Extubation in OR  Informed Consent: I have reviewed the patients History and Physical, chart, labs and discussed the procedure including the risks, benefits and alternatives for the proposed anesthesia with the patient or authorized representative who has indicated his/her understanding and acceptance.   Dental advisory given  Plan Discussed with: CRNA  Anesthesia Plan Comments:        Anesthesia Quick Evaluation

## 2017-03-15 ENCOUNTER — Encounter (HOSPITAL_BASED_OUTPATIENT_CLINIC_OR_DEPARTMENT_OTHER): Payer: Self-pay | Admitting: General Surgery

## 2017-03-15 NOTE — Anesthesia Postprocedure Evaluation (Signed)
Anesthesia Post Note  Patient: Lisa Anthony  Procedure(s) Performed: Procedure(s) (LRB): REMOVAL PORT-A-CATH (Left)     Patient location during evaluation: PACU Anesthesia Type: General Level of consciousness: sedated and patient cooperative Pain management: pain level controlled Vital Signs Assessment: post-procedure vital signs reviewed and stable Respiratory status: spontaneous breathing Cardiovascular status: stable Anesthetic complications: no    Last Vitals:  Vitals:   03/14/17 1635 03/14/17 1650  BP:  121/71  Pulse: (!) 102 95  Resp: (!) 21 16  Temp:  36.9 C    Last Pain:  Vitals:   03/14/17 1650  TempSrc:   PainSc: 0-No pain                 Nolon Nations

## 2017-03-24 ENCOUNTER — Ambulatory Visit
Admission: RE | Admit: 2017-03-24 | Discharge: 2017-03-24 | Disposition: A | Discharge status: Home / Self Care | Payer: 59 | Source: Ambulatory Visit | Attending: Radiation Oncology | Admitting: Radiation Oncology

## 2017-03-24 DIAGNOSIS — C50211 Malignant neoplasm of upper-inner quadrant of right female breast: Secondary | ICD-10-CM | POA: Diagnosis not present

## 2017-03-24 DIAGNOSIS — Z170 Estrogen receptor positive status [ER+]: Secondary | ICD-10-CM | POA: Diagnosis not present

## 2017-03-24 NOTE — Progress Notes (Signed)
  Radiation Oncology         [phone]) [phone] ________________________________  Name: Lisa Anthony MRN: [id number]  Date: 03/24/2017  DOB: 30-Mar-1966  DIAGNOSIS:     ICD-10-CM   1. Malignant neoplasm of upper-inner quadrant of right breast in female, estrogen receptor positive ([place]) C50.211    Z17.0      SIMULATION AND TREATMENT PLANNING NOTE  The patient presented for simulation prior to beginning her course of radiation treatment for her diagnosis of right-sided breast cancer. The patient was placed in a supine position on a breast board. A customized vac-lock bag was constructed and this complex treatment device will be used on a daily basis during her treatment. In this fashion, a CT scan was obtained through the chest area and an isocenter was placed near the chest wall within the breast.  The patient will be planned to receive a course of radiation initially to a dose of 50.4 Gy. This will consist of a whole breast radiotherapy technique. To accomplish this, 2 customized blocks have been designed which will correspond to medial and lateral whole breast tangent fields. This treatment will be accomplished at 1.8 Gy per fraction. A forward planning technique will also be evaluated to determine if this approach improves the plan. It is anticipated that the patient will then receive a 10 Gy boost to the seroma cavity which has been contoured. This will be accomplished at 2 Gy per fraction.   This initial treatment will consist of a 3-D conformal technique. The seroma has been contoured as the primary target structure. Additionally, dose volume histograms of both this target as well as the lungs and heart will also be evaluated. Such an approach is necessary to ensure that the target area is adequately covered while the nearby critical  normal structures are adequately spared.  Plan:  The final anticipated total dose therefore will correspond to 60.4  Gy.    _______________________________   Jodelle Gross, MD, PhD

## 2017-03-24 NOTE — Progress Notes (Signed)
  Radiation Oncology         [phone] ________________________________  Name: Lisa Anthony MRN: [id number]  Date: 03/24/2017  DOB: 02/17/1966  Optical Surface Tracking Plan:  Since intensity modulated radiotherapy (IMRT) and 3D conformal radiation treatment methods are predicated on accurate and precise positioning for treatment, intrafraction motion monitoring is medically necessary to ensure accurate and safe treatment delivery.  The ability to quantify intrafraction motion without excessive ionizing radiation dose can only be performed with optical surface tracking. Accordingly, surface imaging offers the opportunity to obtain 3D measurements of patient position throughout IMRT and 3D treatments without excessive radiation exposure.  I am ordering optical surface tracking for this patient's upcoming course of radiotherapy. ________________________________  Kyung Rudd, MD 03/24/2017 5:30 PM    Reference:   Ursula Alert, J, et al. Surface imaging-based analysis of intrafraction motion for breast radiotherapy patients.Journal of [place], n. 6, nov. 2014. ISSN [id number].   Available at: <http://www.jacmp.org/index.php/jacmp/article/view/4957>.

## 2017-03-28 ENCOUNTER — Telehealth: Payer: Self-pay | Admitting: Hematology and Oncology

## 2017-03-28 NOTE — Telephone Encounter (Signed)
lvm to inform pt of 8/29 appt at 330 pm per sch msg

## 2017-03-29 DIAGNOSIS — Z170 Estrogen receptor positive status [ER+]: Secondary | ICD-10-CM | POA: Diagnosis not present

## 2017-03-29 DIAGNOSIS — C50211 Malignant neoplasm of upper-inner quadrant of right female breast: Secondary | ICD-10-CM | POA: Diagnosis not present

## 2017-04-01 ENCOUNTER — Ambulatory Visit
Admission: RE | Admit: 2017-04-01 | Discharge: 2017-04-01 | Disposition: A | Discharge status: Home / Self Care | Payer: 59 | Source: Ambulatory Visit | Attending: Radiation Oncology | Admitting: Radiation Oncology

## 2017-04-01 DIAGNOSIS — Z170 Estrogen receptor positive status [ER+]: Secondary | ICD-10-CM | POA: Diagnosis not present

## 2017-04-01 DIAGNOSIS — C50211 Malignant neoplasm of upper-inner quadrant of right female breast: Secondary | ICD-10-CM | POA: Diagnosis not present

## 2017-04-04 ENCOUNTER — Ambulatory Visit
Admission: RE | Admit: 2017-04-04 | Discharge: 2017-04-04 | Disposition: A | Discharge status: Home / Self Care | Payer: 59 | Source: Ambulatory Visit | Attending: Radiation Oncology | Admitting: Radiation Oncology

## 2017-04-04 DIAGNOSIS — C50211 Malignant neoplasm of upper-inner quadrant of right female breast: Secondary | ICD-10-CM | POA: Diagnosis not present

## 2017-04-04 DIAGNOSIS — Z170 Estrogen receptor positive status [ER+]: Secondary | ICD-10-CM | POA: Diagnosis not present

## 2017-04-05 ENCOUNTER — Ambulatory Visit
Admission: RE | Admit: 2017-04-05 | Discharge: 2017-04-05 | Disposition: A | Discharge status: Home / Self Care | Payer: 59 | Source: Ambulatory Visit | Attending: Radiation Oncology | Admitting: Radiation Oncology

## 2017-04-05 DIAGNOSIS — C50211 Malignant neoplasm of upper-inner quadrant of right female breast: Secondary | ICD-10-CM | POA: Diagnosis not present

## 2017-04-05 DIAGNOSIS — Z170 Estrogen receptor positive status [ER+]: Secondary | ICD-10-CM | POA: Diagnosis not present

## 2017-04-06 ENCOUNTER — Ambulatory Visit
Admission: RE | Admit: 2017-04-06 | Discharge: 2017-04-06 | Disposition: A | Discharge status: Home / Self Care | Payer: 59 | Source: Ambulatory Visit | Attending: Radiation Oncology | Admitting: Radiation Oncology

## 2017-04-06 VITALS — BP 123/81 | HR 83 | Temp 98.3°F | Resp 20

## 2017-04-06 DIAGNOSIS — Z170 Estrogen receptor positive status [ER+]: Principal | ICD-10-CM

## 2017-04-06 DIAGNOSIS — C50211 Malignant neoplasm of upper-inner quadrant of right female breast: Secondary | ICD-10-CM

## 2017-04-06 MED ORDER — RADIAPLEXRX EX GEL
Freq: Once | CUTANEOUS | Status: AC
  Administered 2017-04-06: 16:00:00 via TOPICAL

## 2017-04-06 MED ORDER — ALRA NON-METALLIC DEODORANT (RAD-ONC)
1.0000 "application " | Freq: Once | TOPICAL | Status: AC
  Administered 2017-04-06: 1 via TOPICAL

## 2017-04-06 NOTE — Progress Notes (Addendum)
pateint education done, my business card, radiation therapy and you book, alra deodorant, radiaplex gel cream given, discussed ways to manage side effects, skin irritation, pain, fatigue, short sharp pains in breast, apply radiaplex after rad tx and bedtimes, alra after rad tx and prn, nothing 4 hours prior to radiation daily, use luke warm water for bath/showers, pat dry, no rubbing scrubbing or scratching treated breast,, no under wire bras, electric shave r only;  increase protein in your diet,eat healthier, stay hydrated,drink plenty water, sees MD weekly and prn, teach back given 4:38 PM

## 2017-04-07 ENCOUNTER — Ambulatory Visit
Admission: RE | Admit: 2017-04-07 | Discharge: 2017-04-07 | Disposition: A | Discharge status: Home / Self Care | Payer: 59 | Source: Ambulatory Visit | Attending: Radiation Oncology | Admitting: Radiation Oncology

## 2017-04-07 DIAGNOSIS — C50211 Malignant neoplasm of upper-inner quadrant of right female breast: Secondary | ICD-10-CM | POA: Diagnosis not present

## 2017-04-07 DIAGNOSIS — Z170 Estrogen receptor positive status [ER+]: Secondary | ICD-10-CM | POA: Diagnosis not present

## 2017-04-08 ENCOUNTER — Ambulatory Visit
Admission: RE | Admit: 2017-04-08 | Discharge: 2017-04-08 | Disposition: A | Discharge status: Home / Self Care | Payer: 59 | Source: Ambulatory Visit | Attending: Radiation Oncology | Admitting: Radiation Oncology

## 2017-04-08 DIAGNOSIS — C50211 Malignant neoplasm of upper-inner quadrant of right female breast: Secondary | ICD-10-CM | POA: Diagnosis not present

## 2017-04-08 DIAGNOSIS — Z170 Estrogen receptor positive status [ER+]: Secondary | ICD-10-CM | POA: Diagnosis not present

## 2017-04-11 ENCOUNTER — Ambulatory Visit
Admission: RE | Admit: 2017-04-11 | Discharge: 2017-04-11 | Disposition: A | Discharge status: Home / Self Care | Payer: 59 | Source: Ambulatory Visit | Attending: Radiation Oncology | Admitting: Radiation Oncology

## 2017-04-11 DIAGNOSIS — Z170 Estrogen receptor positive status [ER+]: Secondary | ICD-10-CM | POA: Diagnosis not present

## 2017-04-11 DIAGNOSIS — C50211 Malignant neoplasm of upper-inner quadrant of right female breast: Secondary | ICD-10-CM | POA: Diagnosis not present

## 2017-04-12 ENCOUNTER — Ambulatory Visit
Admission: RE | Admit: 2017-04-12 | Discharge: 2017-04-12 | Disposition: A | Discharge status: Home / Self Care | Payer: 59 | Source: Ambulatory Visit | Attending: Radiation Oncology | Admitting: Radiation Oncology

## 2017-04-12 DIAGNOSIS — Z170 Estrogen receptor positive status [ER+]: Secondary | ICD-10-CM | POA: Diagnosis not present

## 2017-04-12 DIAGNOSIS — C50211 Malignant neoplasm of upper-inner quadrant of right female breast: Secondary | ICD-10-CM | POA: Diagnosis not present

## 2017-04-13 ENCOUNTER — Ambulatory Visit
Admission: RE | Admit: 2017-04-13 | Discharge: 2017-04-13 | Disposition: A | Discharge status: Home / Self Care | Payer: 59 | Source: Ambulatory Visit | Attending: Radiation Oncology | Admitting: Radiation Oncology

## 2017-04-13 DIAGNOSIS — C50211 Malignant neoplasm of upper-inner quadrant of right female breast: Secondary | ICD-10-CM | POA: Diagnosis not present

## 2017-04-13 DIAGNOSIS — Z170 Estrogen receptor positive status [ER+]: Secondary | ICD-10-CM | POA: Diagnosis not present

## 2017-04-14 ENCOUNTER — Ambulatory Visit
Admission: RE | Admit: 2017-04-14 | Discharge: 2017-04-14 | Disposition: A | Discharge status: Home / Self Care | Payer: 59 | Source: Ambulatory Visit | Attending: Radiation Oncology | Admitting: Radiation Oncology

## 2017-04-14 ENCOUNTER — Other Ambulatory Visit: Payer: Self-pay | Admitting: Hematology and Oncology

## 2017-04-14 DIAGNOSIS — Z170 Estrogen receptor positive status [ER+]: Secondary | ICD-10-CM | POA: Diagnosis not present

## 2017-04-14 DIAGNOSIS — C50211 Malignant neoplasm of upper-inner quadrant of right female breast: Secondary | ICD-10-CM | POA: Diagnosis not present

## 2017-04-15 ENCOUNTER — Ambulatory Visit
Admission: RE | Admit: 2017-04-15 | Discharge: 2017-04-15 | Disposition: A | Discharge status: Home / Self Care | Payer: 59 | Source: Ambulatory Visit | Attending: Radiation Oncology | Admitting: Radiation Oncology

## 2017-04-15 DIAGNOSIS — Z170 Estrogen receptor positive status [ER+]: Secondary | ICD-10-CM | POA: Diagnosis not present

## 2017-04-15 DIAGNOSIS — C50211 Malignant neoplasm of upper-inner quadrant of right female breast: Secondary | ICD-10-CM | POA: Diagnosis not present

## 2017-04-18 ENCOUNTER — Ambulatory Visit
Admission: RE | Admit: 2017-04-18 | Discharge: 2017-04-18 | Disposition: A | Discharge status: Home / Self Care | Payer: 59 | Source: Ambulatory Visit | Attending: Radiation Oncology | Admitting: Radiation Oncology

## 2017-04-18 DIAGNOSIS — Z170 Estrogen receptor positive status [ER+]: Secondary | ICD-10-CM | POA: Diagnosis not present

## 2017-04-18 DIAGNOSIS — C50211 Malignant neoplasm of upper-inner quadrant of right female breast: Secondary | ICD-10-CM | POA: Diagnosis not present

## 2017-04-19 ENCOUNTER — Ambulatory Visit
Admission: RE | Admit: 2017-04-19 | Discharge: 2017-04-19 | Disposition: A | Discharge status: Home / Self Care | Payer: 59 | Source: Ambulatory Visit | Attending: Radiation Oncology | Admitting: Radiation Oncology

## 2017-04-19 DIAGNOSIS — C50211 Malignant neoplasm of upper-inner quadrant of right female breast: Secondary | ICD-10-CM | POA: Diagnosis not present

## 2017-04-19 DIAGNOSIS — Z170 Estrogen receptor positive status [ER+]: Secondary | ICD-10-CM | POA: Diagnosis not present

## 2017-04-20 ENCOUNTER — Ambulatory Visit
Admission: RE | Admit: 2017-04-20 | Discharge: 2017-04-20 | Disposition: A | Discharge status: Home / Self Care | Payer: 59 | Source: Ambulatory Visit | Attending: Radiation Oncology | Admitting: Radiation Oncology

## 2017-04-20 DIAGNOSIS — Z170 Estrogen receptor positive status [ER+]: Secondary | ICD-10-CM | POA: Diagnosis not present

## 2017-04-20 DIAGNOSIS — C50211 Malignant neoplasm of upper-inner quadrant of right female breast: Secondary | ICD-10-CM | POA: Diagnosis not present

## 2017-04-21 ENCOUNTER — Ambulatory Visit
Admission: RE | Admit: 2017-04-21 | Discharge: 2017-04-21 | Disposition: A | Discharge status: Home / Self Care | Payer: 59 | Source: Ambulatory Visit | Attending: Radiation Oncology | Admitting: Radiation Oncology

## 2017-04-21 DIAGNOSIS — Z170 Estrogen receptor positive status [ER+]: Secondary | ICD-10-CM | POA: Diagnosis not present

## 2017-04-21 DIAGNOSIS — C50211 Malignant neoplasm of upper-inner quadrant of right female breast: Secondary | ICD-10-CM | POA: Diagnosis not present

## 2017-04-22 ENCOUNTER — Ambulatory Visit
Admission: RE | Admit: 2017-04-22 | Discharge: 2017-04-22 | Disposition: A | Discharge status: Home / Self Care | Payer: 59 | Source: Ambulatory Visit | Attending: Radiation Oncology | Admitting: Radiation Oncology

## 2017-04-22 DIAGNOSIS — Z170 Estrogen receptor positive status [ER+]: Secondary | ICD-10-CM | POA: Diagnosis not present

## 2017-04-22 DIAGNOSIS — C50211 Malignant neoplasm of upper-inner quadrant of right female breast: Secondary | ICD-10-CM | POA: Diagnosis not present

## 2017-04-25 ENCOUNTER — Ambulatory Visit
Admission: RE | Admit: 2017-04-25 | Discharge: 2017-04-25 | Disposition: A | Discharge status: Home / Self Care | Payer: 59 | Source: Ambulatory Visit | Attending: Radiation Oncology | Admitting: Radiation Oncology

## 2017-04-25 DIAGNOSIS — C50211 Malignant neoplasm of upper-inner quadrant of right female breast: Secondary | ICD-10-CM | POA: Diagnosis not present

## 2017-04-25 DIAGNOSIS — Z170 Estrogen receptor positive status [ER+]: Secondary | ICD-10-CM | POA: Diagnosis not present

## 2017-04-26 ENCOUNTER — Ambulatory Visit
Admission: RE | Admit: 2017-04-26 | Discharge: 2017-04-26 | Disposition: A | Discharge status: Home / Self Care | Payer: 59 | Source: Ambulatory Visit | Attending: Radiation Oncology | Admitting: Radiation Oncology

## 2017-04-26 DIAGNOSIS — Z170 Estrogen receptor positive status [ER+]: Secondary | ICD-10-CM | POA: Diagnosis not present

## 2017-04-26 DIAGNOSIS — C50211 Malignant neoplasm of upper-inner quadrant of right female breast: Secondary | ICD-10-CM | POA: Diagnosis not present

## 2017-04-27 ENCOUNTER — Ambulatory Visit
Admission: RE | Admit: 2017-04-27 | Discharge: 2017-04-27 | Disposition: A | Discharge status: Home / Self Care | Payer: 59 | Source: Ambulatory Visit | Attending: Radiation Oncology | Admitting: Radiation Oncology

## 2017-04-27 DIAGNOSIS — C50211 Malignant neoplasm of upper-inner quadrant of right female breast: Secondary | ICD-10-CM | POA: Diagnosis not present

## 2017-04-27 DIAGNOSIS — Z170 Estrogen receptor positive status [ER+]: Secondary | ICD-10-CM | POA: Diagnosis not present

## 2017-04-28 ENCOUNTER — Ambulatory Visit
Admission: RE | Admit: 2017-04-28 | Discharge: 2017-04-28 | Disposition: A | Discharge status: Home / Self Care | Payer: 59 | Source: Ambulatory Visit | Attending: Radiation Oncology | Admitting: Radiation Oncology

## 2017-04-28 DIAGNOSIS — Z170 Estrogen receptor positive status [ER+]: Secondary | ICD-10-CM | POA: Diagnosis not present

## 2017-04-28 DIAGNOSIS — C50211 Malignant neoplasm of upper-inner quadrant of right female breast: Secondary | ICD-10-CM | POA: Diagnosis not present

## 2017-04-29 ENCOUNTER — Ambulatory Visit
Admission: RE | Admit: 2017-04-29 | Discharge: 2017-04-29 | Disposition: A | Discharge status: Home / Self Care | Payer: 59 | Source: Ambulatory Visit | Attending: Radiation Oncology | Admitting: Radiation Oncology

## 2017-04-29 DIAGNOSIS — C50211 Malignant neoplasm of upper-inner quadrant of right female breast: Secondary | ICD-10-CM | POA: Diagnosis not present

## 2017-04-29 DIAGNOSIS — Z170 Estrogen receptor positive status [ER+]: Secondary | ICD-10-CM | POA: Diagnosis not present

## 2017-05-02 ENCOUNTER — Ambulatory Visit
Admission: RE | Admit: 2017-05-02 | Discharge: 2017-05-02 | Disposition: A | Discharge status: Home / Self Care | Payer: 59 | Source: Ambulatory Visit | Attending: Radiation Oncology | Admitting: Radiation Oncology

## 2017-05-02 DIAGNOSIS — Z170 Estrogen receptor positive status [ER+]: Secondary | ICD-10-CM | POA: Diagnosis not present

## 2017-05-02 DIAGNOSIS — C50211 Malignant neoplasm of upper-inner quadrant of right female breast: Secondary | ICD-10-CM | POA: Diagnosis not present

## 2017-05-03 ENCOUNTER — Ambulatory Visit
Admission: RE | Admit: 2017-05-03 | Discharge: 2017-05-03 | Disposition: A | Discharge status: Home / Self Care | Payer: 59 | Source: Ambulatory Visit | Attending: Radiation Oncology | Admitting: Radiation Oncology

## 2017-05-03 DIAGNOSIS — C50211 Malignant neoplasm of upper-inner quadrant of right female breast: Secondary | ICD-10-CM | POA: Diagnosis not present

## 2017-05-03 DIAGNOSIS — Z170 Estrogen receptor positive status [ER+]: Secondary | ICD-10-CM | POA: Diagnosis not present

## 2017-05-04 ENCOUNTER — Ambulatory Visit
Admission: RE | Admit: 2017-05-04 | Discharge: 2017-05-04 | Disposition: A | Discharge status: Home / Self Care | Payer: 59 | Source: Ambulatory Visit | Attending: Radiation Oncology | Admitting: Radiation Oncology

## 2017-05-04 DIAGNOSIS — C50211 Malignant neoplasm of upper-inner quadrant of right female breast: Secondary | ICD-10-CM | POA: Diagnosis not present

## 2017-05-04 DIAGNOSIS — Z170 Estrogen receptor positive status [ER+]: Secondary | ICD-10-CM | POA: Diagnosis not present

## 2017-05-05 ENCOUNTER — Ambulatory Visit
Admission: RE | Admit: 2017-05-05 | Discharge: 2017-05-05 | Disposition: A | Discharge status: Home / Self Care | Payer: 59 | Source: Ambulatory Visit | Attending: Radiation Oncology | Admitting: Radiation Oncology

## 2017-05-05 DIAGNOSIS — C50211 Malignant neoplasm of upper-inner quadrant of right female breast: Secondary | ICD-10-CM | POA: Diagnosis not present

## 2017-05-05 DIAGNOSIS — Z170 Estrogen receptor positive status [ER+]: Secondary | ICD-10-CM | POA: Diagnosis not present

## 2017-05-06 ENCOUNTER — Ambulatory Visit
Admission: RE | Admit: 2017-05-06 | Discharge: 2017-05-06 | Disposition: A | Discharge status: Home / Self Care | Payer: 59 | Source: Ambulatory Visit | Attending: Radiation Oncology | Admitting: Radiation Oncology

## 2017-05-06 DIAGNOSIS — C50211 Malignant neoplasm of upper-inner quadrant of right female breast: Secondary | ICD-10-CM | POA: Diagnosis not present

## 2017-05-06 DIAGNOSIS — Z170 Estrogen receptor positive status [ER+]: Secondary | ICD-10-CM | POA: Diagnosis not present

## 2017-05-09 ENCOUNTER — Ambulatory Visit
Admission: RE | Admit: 2017-05-09 | Discharge: 2017-05-09 | Disposition: A | Discharge status: Home / Self Care | Payer: 59 | Source: Ambulatory Visit | Attending: Radiation Oncology | Admitting: Radiation Oncology

## 2017-05-09 DIAGNOSIS — Z170 Estrogen receptor positive status [ER+]: Secondary | ICD-10-CM | POA: Diagnosis not present

## 2017-05-09 DIAGNOSIS — C50211 Malignant neoplasm of upper-inner quadrant of right female breast: Secondary | ICD-10-CM | POA: Diagnosis not present

## 2017-05-10 ENCOUNTER — Ambulatory Visit
Admission: RE | Admit: 2017-05-10 | Discharge: 2017-05-10 | Disposition: A | Discharge status: Home / Self Care | Payer: 59 | Source: Ambulatory Visit | Attending: Radiation Oncology | Admitting: Radiation Oncology

## 2017-05-10 DIAGNOSIS — Z170 Estrogen receptor positive status [ER+]: Secondary | ICD-10-CM | POA: Diagnosis not present

## 2017-05-10 DIAGNOSIS — C50211 Malignant neoplasm of upper-inner quadrant of right female breast: Secondary | ICD-10-CM | POA: Diagnosis not present

## 2017-05-11 ENCOUNTER — Ambulatory Visit
Admission: RE | Admit: 2017-05-11 | Discharge: 2017-05-11 | Disposition: A | Discharge status: Home / Self Care | Payer: 59 | Source: Ambulatory Visit | Attending: Radiation Oncology | Admitting: Radiation Oncology

## 2017-05-11 DIAGNOSIS — Z170 Estrogen receptor positive status [ER+]: Secondary | ICD-10-CM | POA: Diagnosis not present

## 2017-05-11 DIAGNOSIS — C50211 Malignant neoplasm of upper-inner quadrant of right female breast: Secondary | ICD-10-CM | POA: Diagnosis not present

## 2017-05-11 NOTE — Addendum Note (Signed)
Addendum  created 05/11/17 1200 by Albertha Ghee, MD   Sign clinical note

## 2017-05-12 ENCOUNTER — Ambulatory Visit
Admission: RE | Admit: 2017-05-12 | Discharge: 2017-05-12 | Disposition: A | Discharge status: Home / Self Care | Payer: 59 | Source: Ambulatory Visit | Attending: Radiation Oncology | Admitting: Radiation Oncology

## 2017-05-12 DIAGNOSIS — C50211 Malignant neoplasm of upper-inner quadrant of right female breast: Secondary | ICD-10-CM | POA: Diagnosis not present

## 2017-05-12 DIAGNOSIS — Z170 Estrogen receptor positive status [ER+]: Secondary | ICD-10-CM | POA: Diagnosis not present

## 2017-05-13 ENCOUNTER — Ambulatory Visit
Admission: RE | Admit: 2017-05-13 | Discharge: 2017-05-13 | Disposition: A | Discharge status: Home / Self Care | Payer: 59 | Source: Ambulatory Visit | Attending: Radiation Oncology | Admitting: Radiation Oncology

## 2017-05-13 DIAGNOSIS — Z170 Estrogen receptor positive status [ER+]: Secondary | ICD-10-CM | POA: Diagnosis not present

## 2017-05-13 DIAGNOSIS — C50211 Malignant neoplasm of upper-inner quadrant of right female breast: Secondary | ICD-10-CM | POA: Diagnosis not present

## 2017-05-16 ENCOUNTER — Ambulatory Visit
Admission: RE | Admit: 2017-05-16 | Discharge: 2017-05-16 | Disposition: A | Discharge status: Home / Self Care | Payer: 59 | Source: Ambulatory Visit | Attending: Radiation Oncology | Admitting: Radiation Oncology

## 2017-05-16 DIAGNOSIS — C50211 Malignant neoplasm of upper-inner quadrant of right female breast: Secondary | ICD-10-CM | POA: Diagnosis not present

## 2017-05-16 DIAGNOSIS — Z170 Estrogen receptor positive status [ER+]: Secondary | ICD-10-CM | POA: Diagnosis not present

## 2017-05-17 ENCOUNTER — Ambulatory Visit
Admission: RE | Admit: 2017-05-17 | Discharge: 2017-05-17 | Disposition: A | Discharge status: Home / Self Care | Payer: 59 | Source: Ambulatory Visit | Attending: Radiation Oncology | Admitting: Radiation Oncology

## 2017-05-17 DIAGNOSIS — C50211 Malignant neoplasm of upper-inner quadrant of right female breast: Secondary | ICD-10-CM | POA: Diagnosis not present

## 2017-05-17 DIAGNOSIS — Z170 Estrogen receptor positive status [ER+]: Secondary | ICD-10-CM | POA: Diagnosis not present

## 2017-05-18 ENCOUNTER — Ambulatory Visit
Admission: RE | Admit: 2017-05-18 | Discharge: 2017-05-18 | Disposition: A | Discharge status: Home / Self Care | Payer: 59 | Source: Ambulatory Visit | Attending: Radiation Oncology | Admitting: Radiation Oncology

## 2017-05-18 ENCOUNTER — Encounter: Payer: Self-pay | Admitting: Radiation Oncology

## 2017-05-18 ENCOUNTER — Encounter: Payer: Self-pay | Admitting: Hematology and Oncology

## 2017-05-18 ENCOUNTER — Ambulatory Visit (HOSPITAL_BASED_OUTPATIENT_CLINIC_OR_DEPARTMENT_OTHER): Payer: 59 | Admitting: Hematology and Oncology

## 2017-05-18 DIAGNOSIS — Z170 Estrogen receptor positive status [ER+]: Secondary | ICD-10-CM

## 2017-05-18 DIAGNOSIS — C50211 Malignant neoplasm of upper-inner quadrant of right female breast: Secondary | ICD-10-CM

## 2017-05-18 MED ORDER — TAMOXIFEN CITRATE 20 MG PO TABS: 20.0000 mg | ORAL_TABLET | Freq: Every day | ORAL | 3 refills | Status: DC

## 2017-05-18 NOTE — Progress Notes (Signed)
Patient Care Team: Patient, No Pcp Per as PCP - General (General Practice) Lovell Sheehan, NP as Nurse Practitioner (Nurse Practitioner) Jovita Kussmaul, MD as Consulting Physician (General Surgery) Nicholas Lose, MD as Consulting Physician (Hematology and Oncology) Kyung Rudd, MD as Consulting Physician (Radiation Oncology)  DIAGNOSIS:  Encounter Diagnosis  Name Primary?  . Malignant neoplasm of upper-inner quadrant of right breast in female, estrogen receptor positive ([place])     SUMMARY OF ONCOLOGIC HISTORY:   Malignant neoplasm of upper-inner quadrant of right breast in female, estrogen receptor positive ([place])   10/20/2016 Initial Diagnosis    Right breast biopsy 12:30 position: IDC grade 3, ER 100%, PR 95%, Ki-67 30%, HER-2 negative ratio 1.31, screening detected right breast asymmetry and calcifications 1.1 cm, right axillary borderline enlarged lymph node biopsy benign, T1c N0 stage IA clinical stage      11/18/2016 Surgery    Right lumpectomy: IDC with DCIS, 1 cm, margins negative, 0/4 lymph nodes, ER 100%, PR 95%, Ki-67 30%, HER-2 negative ratio 1.31, T1 BN 0 stage IA       11/25/2016 Oncotype testing    Oncotype DX score 27: 18% risk of recurrence with tamoxifen alone intermediate risk      12/21/2016 - 02/22/2017 Adjuvant Chemotherapy    Taxotere and Cytoxan x 4 cycles      04/04/2017 - 05/18/2017 Radiation Therapy    Adjuvant radiation therapy      05/18/2017 -  Anti-estrogen oral therapy    Tamoxifen 20 mg daily       Genetic testing   11/16/2016 Initial Diagnosis    Genetic testing was negative for mutations in the 43 genes on [facility] Common Cancers panel (APC, ATM, AXIN2, BARD1, BMPR1A, BRCA1, BRCA2, BRIP1, CDH1, CDKN2A, CHEK2, DICER1, EPCAM, GREM1, HOXB13, KIT, MEN1, MLH1, MSH2, MSH6, MUTYH, NBN, NF1, PALB2, PDGFRA, PMS2, POLD1, POLE, PTEN, RAD50, RAD51C, RAD51D, SDHA, SDHB, SDHC, SDHD, SMAD4, SMARCA4, STK11, TP53, TSC1, TSC2, VHL).       CHIEF COMPLIANT:  Follow-up after radiation therapy is complete  INTERVAL HISTORY: Lisa Anthony is a 51 year old with above-mentioned history of right breast cancer treated with lumpectomy adjuvant chemotherapy with Taxotere and Cytoxan and just completed radiation therapy today. She is here today to discuss starting antiestrogen therapy. Apart from soreness from sunburn-like rash as she is doing quite well. 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  REVIEW OF SYSTEMS:   Constitutional: Denies fevers, chills or abnormal weight loss Eyes: Denies blurriness of vision Ears, nose, mouth, throat, and face: Denies mucositis or sore throat Respiratory: Denies cough, dyspnea or wheezes Cardiovascular: Denies palpitation, chest discomfort Gastrointestinal:  Denies nausea, heartburn or change in bowel habits Skin: Denies abnormal skin rashes Lymphatics: Denies new lymphadenopathy or easy bruising Neurological:Denies numbness, tingling or new weaknesses Behavioral/Psych: Mood is stable, no new changes  Extremities: No lower extremity edema Breast:  denies any pain or lumps or nodules in either breasts All other systems were reviewed with the patient and are negative.  I have reviewed the past medical history, past surgical history, social history and family history with the patient and they are unchanged from previous note.  ALLERGIES:  is allergic to sulfa antibiotics.  MEDICATIONS:  Current Outpatient Prescriptions  Medication Sig Dispense Refill  . Minocycline HCl (SOLODYN) 80 MG TB24 Take 1 tablet by mouth 2 (two) times a week. 1-2 tablets per week     . tamoxifen (NOLVADEX) 20 MG tablet Take 1 tablet (20 mg  total) by mouth daily. 90 tablet 3  . venlafaxine XR (EFFEXOR-XR) 37.5 MG 24 hr capsule TAKE 1 CAPSULE (37.5 MG TOTAL) BY MOUTH DAILY WITH BREAKFAST. 30 capsule 3   No current facility-administered medications for this visit.     PHYSICAL EXAMINATION: ECOG PERFORMANCE STATUS: 1 -  Symptomatic but completely ambulatory  Vitals:   05/18/17 1547  BP: 125/80  Pulse: 89  Resp: 20  Temp: 98.3 F (36.8 C)  SpO2: 98%   Filed Weights   05/18/17 1547  Weight: 157 lb 8 oz (71.4 kg)    GENERAL:alert, no distress and comfortable SKIN: skin color, texture, turgor are normal, no rashes or significant lesions EYES: normal, Conjunctiva are pink and non-injected, sclera clear OROPHARYNX:no exudate, no erythema and lips, buccal mucosa, and tongue normal  NECK: supple, thyroid normal size, non-tender, without nodularity LYMPH:  no palpable lymphadenopathy in the cervical, axillary or inguinal LUNGS: clear to auscultation and percussion with normal breathing effort HEART: regular rate & rhythm and no murmurs and no lower extremity edema ABDOMEN:abdomen soft, non-tender and normal bowel sounds MUSCULOSKELETAL:no cyanosis of digits and no clubbing  NEURO: alert & oriented x 3 with fluent speech, no focal motor/sensory deficits EXTREMITIES: No lower extremity edema   LABORATORY DATA:  I have reviewed the data as listed   Chemistry      Component Value Date/Time   NA 140 02/22/2017 0939   K 3.8 02/22/2017 0939   CL 101 12/10/2016 0841   CO2 27 02/22/2017 0939   BUN 15.0 02/22/2017 0939   CREATININE 0.8 02/22/2017 0939      Component Value Date/Time   CALCIUM 9.8 02/22/2017 0939   ALKPHOS 81 02/22/2017 0939   AST 25 02/22/2017 0939   ALT 31 02/22/2017 0939   BILITOT 0.44 02/22/2017 0939       Lab Results  Component Value Date   WBC 8.9 02/22/2017   HGB 10.3 (L) 02/22/2017   HCT 29.8 (L) 02/22/2017   MCV 95.4 02/22/2017   PLT 311 02/22/2017   NEUTROABS 5.5 02/22/2017    ASSESSMENT & PLAN:  Malignant neoplasm of upper-inner quadrant of right breast in female, estrogen receptor positive ([facility]) Right lumpectomy: IDC with DCIS, 1 cm, margins negative, 0/4 lymph nodes, ER 100%, PR 95%, Ki-67 30%, HER-2 negative ratio 1.31, T1 BN 0 stage IA Oncotype DX score 27:  18% risk of recurrence with tamoxifen alone intermediate risk.  Treatment summary: 1. adjuvant chemotherapy with Taxotere and Cytoxan every 3 weeks 4 started 12/21/2016 Completed 02/22/2017  2. radiation therapy 04/04/2017 - 05/18/2017 3. Adjuvant antiestrogen therapy with tamoxifen 20 mg daily started 05/18/2017 (last menstrual cycle was April 2018)  Tamoxifen counseling: We discussed the risks and benefits of tamoxifen. These include but not limited to insomnia, hot flashes, mood changes, vaginal dryness, and weight gain. Although rare, serious side effects including endometrial cancer, risk of blood clots were also discussed. We strongly believe that the benefits far outweigh the risks. Patient understands these risks and consented to starting treatment. Planned treatment duration is 5-10 years.  Return to clinic in 3 months for survivorship care plan visit and toxicity evaluation visit with Mendel Ryder  I spent 25 minutes talking to the patient of which more than half was spent in counseling and coordination of care.  No orders of the defined types were placed in this encounter.  The patient has a good understanding of the overall plan. she agrees with it. she will call with any problems that may develop  before the next visit here.   Rulon Eisenmenger, MD 05/18/17

## 2017-05-18 NOTE — Assessment & Plan Note (Signed)
Right lumpectomy: IDC with DCIS, 1 cm, margins negative, 0/4 lymph nodes, ER 100%, PR 95%, Ki-67 30%, HER-2 negative ratio 1.31, T1 BN 0 stage IA Oncotype DX score 27: 18% risk of recurrence with tamoxifen alone intermediate risk.  Treatment summary: 1. adjuvant chemotherapy with Taxotere and Cytoxan every 3 weeks 4 started 12/21/2016 2. radiation therapy 04/04/2017 - 05/18/2017 3. Adjuvant antiestrogen therapy with tamoxifen 20 mg daily started 05/18/2017 (last menstrual cycle was April 2018)  Tamoxifen counseling: We discussed the risks and benefits of tamoxifen. These include but not limited to insomnia, hot flashes, mood changes, vaginal dryness, and weight gain. Although rare, serious side effects including endometrial cancer, risk of blood clots were also discussed. We strongly believe that the benefits far outweigh the risks. Patient understands these risks and consented to starting treatment. Planned treatment duration is 5-10 years.

## 2017-05-19 ENCOUNTER — Encounter: Payer: Self-pay | Admitting: *Deleted

## 2017-06-15 NOTE — Progress Notes (Signed)
  Radiation Oncology         [phone] ________________________________  Name: Lisa Anthony MRN: [id number]  Date: 05/18/2017  DOB: January 09, 1966  End of Treatment Note  Diagnosis:   Stage IA, pT1c N0 Mx, ER/PR positive, grade 3 invasive ductal carcinoma of the right breast.       ICD-10-CM   1. Malignant neoplasm of upper-inner quadrant of right breast in female, estrogen receptor positive ([place]) C50.211    Z17.0     Indication for treatment:  Curative      Radiation treatment dates:   04/04/2017 to 05/18/2017  Site/dose:    1. The Right breast was treated to 50.4 Gy in 28 fractions at 1.8 Gy per fraction. 2. The Right breast was boosted to 10 Gy in 5 fractions at 2 Gy per fraction.   Beams/energy:    1. 3D // 6X 2. electron // 12E  Narrative: The patient tolerated radiation treatment relatively well.   She developed hyperpigmentation in the treatment area with some dry desquamation. No moist desquamation present. The patient's skin as expected to heal well in the next couple of weeks.   Plan: The patient has completed radiation treatment. The patient will return to radiation oncology clinic for routine followup in one month. I advised them to call or return sooner if they have any questions or concerns related to their recovery or treatment.  ------------------------------------------------  Jodelle Gross, MD, PhD  This document serves as a record of services personally performed by Kyung Rudd, MD. It was created on his behalf by Arlyce Harman, a trained medical scribe. The creation of this record is based on the scribe's personal observations and the provider's statements to them. This document has been checked and approved by the attending provider.

## 2017-06-30 ENCOUNTER — Encounter: Payer: Self-pay | Admitting: Radiation Oncology

## 2017-06-30 ENCOUNTER — Ambulatory Visit
Admission: RE | Admit: 2017-06-30 | Discharge: 2017-06-30 | Disposition: A | Discharge status: Home / Self Care | Payer: 59 | Source: Ambulatory Visit | Attending: Radiation Oncology | Admitting: Radiation Oncology

## 2017-06-30 VITALS — BP 118/90 | HR 93 | Temp 98.4°F | Resp 18 | Ht 63.0 in | Wt 152.2 lb

## 2017-06-30 DIAGNOSIS — C50211 Malignant neoplasm of upper-inner quadrant of right female breast: Secondary | ICD-10-CM | POA: Insufficient documentation

## 2017-06-30 DIAGNOSIS — Z170 Estrogen receptor positive status [ER+]: Secondary | ICD-10-CM | POA: Insufficient documentation

## 2017-06-30 NOTE — Progress Notes (Signed)
Radiation Oncology         [phone] ________________________________  Name: Lisa Anthony MRN: [id number]  Date of Service: 06/30/2017  DOB: May 18, 1966  Post Treatment Note  CC: Patient, No Pcp Per  Nicholas Lose, MD  Diagnosis:   Stage IA, pT1c N0 Mx, ER/PR positive,grade 3 invasive ductal carcinoma of the right breast.     Interval Since Last Radiation:  6 weeks   04/04/2017 to 05/18/2017: 1. The Right breast was treated to 50.4 Gy in 28 fractions at 1.8 Gy per fraction. 2. The Right breast was boosted to 10 Gy in 5 fractions at 2 Gy per fraction.   Narrative:  The patient returns today for routine follow-up. During treatment she did very well with radiotherapy and did have mild dry desquamation.                             On review of systems, the patient states she is doing well overall. She does have persistent hot flashes since chemotherapy and has noticed more frequency about this since starting tamoxifen. She reports her skin is improving nicely and denies any concerns with her skin at this point.  ALLERGIES:  is allergic to sulfa antibiotics.  Meds: Current Outpatient Prescriptions  Medication Sig Dispense Refill  . Minocycline HCl (SOLODYN) 80 MG TB24 Take 1 tablet by mouth 2 (two) times a week. 1-2 tablets per week     . tamoxifen (NOLVADEX) 20 MG tablet Take 1 tablet (20 mg total) by mouth daily. 90 tablet 3  . venlafaxine XR (EFFEXOR-XR) 37.5 MG 24 hr capsule TAKE 1 CAPSULE (37.5 MG TOTAL) BY MOUTH DAILY WITH BREAKFAST. 30 capsule 3   No current facility-administered medications for this encounter.     Physical Findings:  height is 5\' 3"  (1.6 m) and weight is 152 lb 3.2 oz (69 kg). Her oral temperature is 98.4 F (36.9 C). Her blood pressure is 118/90 and her pulse is 93. Her respiration is 18 and oxygen saturation is 97%.  Pain Assessment Pain Score: 0-No pain/10 In general this is a well appearing caucasian female in no acute distress. She's alert and  oriented x4 and appropriate throughout the examination. Cardiopulmonary assessment is negative for acute distress and she exhibits normal effort. The right breast was examined and reveals hyperpigmentation diffusely along the breast field and no evidence of desquamation is present.   Lab Findings: Lab Results  Component Value Date   WBC 8.9 02/22/2017   HGB 10.3 (L) 02/22/2017   HCT 29.8 (L) 02/22/2017   MCV 95.4 02/22/2017   PLT 311 02/22/2017     Radiographic Findings: No results found.  Impression/Plan: 1. Stage IA, pT1c N0 Mx, ER/PR positive,grade 3 invasive ductal carcinoma of the right breast.   . The patient has been doing well since completion of radiotherapy. We discussed that we would be happy to continue to follow her as needed, but she will also continue to follow up with Dr. Verdie Drown in medical oncology. She was counseled on skin care as well as measures to avoid sun exposure to this area.  2. Survivorship. She is scheduled to meet with survivorship as well and we discussed the role of this. She was also given a calendar regarding the resources of offerings here at the cancer center. 3. Vasomotor symptoms of menopause. 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 She's taking Effexor XR  and is using 37.5 mg strength. I'll touch base with medical oncology as well as her navigator to see if Dr. Lindi Adie would be alright with increasing this to 75 mg, and or other options to treat this.      Carola Rhine, PAC

## 2017-07-04 ENCOUNTER — Other Ambulatory Visit: Payer: Self-pay | Admitting: *Deleted

## 2017-07-04 DIAGNOSIS — C50211 Malignant neoplasm of upper-inner quadrant of right female breast: Secondary | ICD-10-CM

## 2017-07-04 DIAGNOSIS — Z170 Estrogen receptor positive status [ER+]: Principal | ICD-10-CM

## 2017-07-04 MED ORDER — VENLAFAXINE HCL ER 75 MG PO CP24: 75.0000 mg | ORAL_CAPSULE | Freq: Every day | ORAL | 3 refills | Status: DC

## 2017-07-04 NOTE — Telephone Encounter (Signed)
Received message that she would like to increase her dose due to her hot flashes.  New Rx sent to her pharmacy and I left her message to let her know.

## 2017-07-08 ENCOUNTER — Encounter: Payer: Self-pay | Admitting: *Deleted

## 2017-07-08 NOTE — Progress Notes (Signed)
[place] with [facility] she has increased her Effexor to 75 mg and it is helping. She will get Dr. Lindi Adie to give her new prescription when needed for the Effexor.

## 2017-07-13 MED FILL — TAMOXIFEN CITRATE 20 MG TAB: 20 | 90 days supply | Qty: 90 | Fill #0

## 2017-07-13 MED FILL — VENLAFAXINE HCL ER 75 MG CA: 75 | 90 days supply | Qty: 90 | Fill #0

## 2017-08-17 ENCOUNTER — Telehealth: Payer: Self-pay

## 2017-08-17 NOTE — Telephone Encounter (Signed)
Spoke with pt to remind of SCP visit on 12/3 at 10 am. She states she will come to appt.

## 2017-08-22 ENCOUNTER — Ambulatory Visit (HOSPITAL_BASED_OUTPATIENT_CLINIC_OR_DEPARTMENT_OTHER): Payer: 59 | Admitting: Adult Health

## 2017-08-22 ENCOUNTER — Other Ambulatory Visit: Payer: Self-pay

## 2017-08-22 ENCOUNTER — Encounter: Payer: Self-pay | Admitting: Adult Health

## 2017-08-22 VITALS — BP 127/59 | HR 89 | Temp 98.2°F | Resp 18 | Ht 63.0 in | Wt 154.5 lb

## 2017-08-22 DIAGNOSIS — C50211 Malignant neoplasm of upper-inner quadrant of right female breast: Secondary | ICD-10-CM | POA: Diagnosis not present

## 2017-08-22 DIAGNOSIS — Z170 Estrogen receptor positive status [ER+]: Secondary | ICD-10-CM

## 2017-08-22 DIAGNOSIS — E2839 Other primary ovarian failure: Secondary | ICD-10-CM | POA: Diagnosis not present

## 2017-08-22 NOTE — Progress Notes (Signed)
CLINIC:  Survivorship   REASON FOR VISIT:  Routine follow-up post-treatment for a recent history of breast cancer.  BRIEF ONCOLOGIC HISTORY:    Malignant neoplasm of upper-inner quadrant of right breast in female, estrogen receptor positive ([place])   10/20/2016 Initial Diagnosis    Right breast biopsy 12:30 position: IDC grade 3, ER 100%, PR 95%, Ki-67 30%, HER-2 negative ratio 1.31, screening detected right breast asymmetry and calcifications 1.1 cm, right axillary borderline enlarged lymph node biopsy benign, T1c N0 stage IA clinical stage      11/17/2016 Genetic Testing    Testing was normal and did not reveal a mutation.  Genes tested include: APC, ATM, AXIN2, BARD1, BMPR1A, BRCA1, BRCA2, BRIP1, CDH1, CDKN2A, CHEK2, DICER1, EPCAM, GREM1, HOXB13, KIT, MEN1, MLH1, MSH2, MSH6, MUTYH, NBN, NF1, PALB2, PDGFRA, PMS2, POLD1, POLE, PTEN, RAD50, RAD51C, RAD51D, SDHA, SDHB, SDHC, SDHD, SMAD4, SMARCA4, STK11, TP53, TSC1, TSC2, VHL          11/18/2016 Surgery    Right lumpectomy: IDC with DCIS, 1 cm, margins negative, 0/4 lymph nodes, ER 100%, PR 95%, Ki-67 30%, HER-2 negative ratio 1.31, T1 BN 0 stage IA       11/25/2016 Oncotype testing    Oncotype DX score 27: 18% risk of recurrence with tamoxifen alone intermediate risk      12/21/2016 - 02/22/2017 Adjuvant Chemotherapy    Taxotere and Cytoxan x 4 cycles      04/04/2017 - 05/18/2017 Radiation Therapy    Adjuvant radiation therapy      05/18/2017 -  Anti-estrogen oral therapy    Tamoxifen 20 mg daily       Genetic testing   11/16/2016 Initial Diagnosis    Genetic testing was negative for mutations in the 43 genes on [facility]'s Common Cancers panel (APC, ATM, AXIN2, BARD1, BMPR1A, BRCA1, BRCA2, BRIP1, CDH1, CDKN2A, CHEK2, DICER1, EPCAM, GREM1, HOXB13, KIT, MEN1, MLH1, MSH2, MSH6, MUTYH, NBN, NF1, PALB2, PDGFRA, PMS2, POLD1, POLE, PTEN, RAD50, RAD51C, RAD51D, SDHA, SDHB, SDHC, SDHD, SMAD4, SMARCA4, STK11, TP53, TSC1, TSC2, VHL).        INTERVAL HISTORY:  Lisa Anthony presents to the [facility] today for our initial meeting to review her survivorship care plan detailing her treatment course for breast cancer, as well as monitoring long-term side effects of that treatment, education regarding health maintenance, screening, and overall wellness and health promotion.     Overall, Lisa Anthony reports feeling quite well.  She is taking Tamoxifen daily and is experiencing some hot flashes she is managing well.  Otherwise she is without any questions or concerns.      REVIEW OF SYSTEMS:  Review of Systems  Constitutional: Negative for appetite change, chills, diaphoresis, fatigue, fever and unexpected weight change.  HENT:   Negative for hearing loss and lump/mass.   Eyes: Negative for eye problems and icterus.  Respiratory: Negative for chest tightness, cough and shortness of breath.   Cardiovascular: Negative for chest pain, leg swelling and palpitations.  Gastrointestinal: Negative for abdominal distention, abdominal pain, constipation, diarrhea, nausea and vomiting.  Endocrine: Positive for hot flashes.  Genitourinary: Negative for difficulty urinating.   Musculoskeletal: Negative for arthralgias.  Skin: Negative for itching and rash.  Neurological: Negative for dizziness, extremity weakness and headaches.  Hematological: Negative for adenopathy.  Psychiatric/Behavioral: Negative for depression. The patient is not nervous/anxious.    Breast: Denies any new nodularity, masses, tenderness, nipple changes, or nipple discharge.      ONCOLOGY TREATMENT TEAM:  1. Surgeon:  Dr. Marlou Starks  at [facility] 2. Medical Oncologist: Dr. Lindi Anthony  3. Radiation Oncologist: Dr. Lisbeth Renshaw    PAST MEDICAL/SURGICAL HISTORY:  Past Medical History:  Diagnosis Date  . Anxiety   . Breast cancer ([place])    right  . History of kidney stones    yrs ago    Past Surgical History:  Procedure Laterality Date  . BREAST  LUMPECTOMY WITH RADIOACTIVE SEED AND SENTINEL LYMPH NODE BIOPSY Right 11/18/2016   Procedure: BREAST LUMPECTOMY WITH RADIOACTIVE SEED AND SENTINEL LYMPH NODE BIOPSY;  Surgeon: Autumn Messing III, MD;  Location: [facility];  Service: General;  Laterality: Right;  . HERNIA REPAIR    . PORT-A-CATH REMOVAL Left 03/14/2017   Procedure: REMOVAL PORT-A-CATH;  Surgeon: Jovita Kussmaul, MD;  Location: [facility];  Service: General;  Laterality: Left;  . PORTACATH PLACEMENT N/A 12/13/2016   Procedure: INSERTION PORT-A-CATH;  Surgeon: Autumn Messing III, MD;  Location: [facility];  Service: General;  Laterality: N/A;     ALLERGIES:  Allergies  Allergen Reactions  . Sulfa Antibiotics Hives     CURRENT MEDICATIONS:  Outpatient Encounter Medications as of 08/22/2017  Medication Sig Note  . Minocycline HCl 90 MG TB24 Take 1 tablet by mouth as needed. 08/22/2017: Takes approximately 3 times a month. Smith,Kimberly N, RN 08/22/17 10:07 AM      . tamoxifen (NOLVADEX) 20 MG tablet Take 1 tablet (20 mg total) by mouth daily.   Marland Kitchen venlafaxine XR (EFFEXOR-XR) 75 MG 24 hr capsule Take 1 capsule (75 mg total) by mouth daily with breakfast.   . [DISCONTINUED] Minocycline HCl (SOLODYN) 80 MG TB24 Take 1 tablet by mouth 2 (two) times a week. 1-2 tablets per week    . [DISCONTINUED] venlafaxine XR (EFFEXOR-XR) 37.5 MG 24 hr capsule TAKE 1 CAPSULE (37.5 MG TOTAL) BY MOUTH DAILY WITH BREAKFAST.    No facility-administered encounter medications on file as of 08/22/2017.      ONCOLOGIC FAMILY HISTORY:  Family History  Problem Relation Age of Onset  . Breast cancer Maternal Grandmother 65       bilateral  . Breast cancer Cousin 30     GENETIC COUNSELING/TESTING: Negative, see above  SOCIAL HISTORY:  Lisa Anthony is single and lives with her two children in [place], [place].  She has 3 children and two live with her, and one lives in [place] (attending [employer]).  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  She denies any current or history of tobacco, alcohol, or illicit drug use.     PHYSICAL EXAMINATION:  Vital Signs:   Vitals:   08/22/17 0958  BP: (!) 127/59  Pulse: 89  Resp: 18  Temp: 98.2 F (36.8 C)  SpO2: 98%   Filed Weights   08/22/17 0958  Weight: 154 lb 8 oz (70.1 kg)   General: Well-nourished, well-appearing female in no acute distress.  She is unaccompanied today.   HEENT: Head is normocephalic.  Pupils equal and reactive to light. Conjunctivae clear without exudate.  Sclerae anicteric. Oral mucosa is pink, moist.  Oropharynx is pink without lesions or erythema.  Lymph: No cervical, supraclavicular, or infraclavicular lymphadenopathy noted on palpation.  Cardiovascular: Regular rate and rhythm.Marland Kitchen Respiratory: Clear to auscultation bilaterally. Chest expansion symmetric; breathing non-labored.  Breast: right breast s/p lumpectomy and radiation, mild amount of scar tissue present, no nodules, no masses, left breast without nodules, masses, skin or nipple changes. GI: Abdomen soft and round; non-tender,  non-distended. Bowel sounds normoactive.  GU: Deferred.  Neuro: No focal deficits. Steady gait.  Psych: Mood and affect normal and appropriate for situation.  Extremities: No edema. MSK: No focal spinal tenderness to palpation.  Full range of motion in bilateral upper extremities Skin: Warm and dry.  LABORATORY DATA:  None for this visit.  DIAGNOSTIC IMAGING:  None for this visit.      ASSESSMENT AND PLAN:  Lisa Anthony is a pleasant 51 y.o. female with Stage IA right breast invasive ductal carcinoma, ER+/PR+/HER2-, diagnosed in 09/2016, treated with lumpectomy, adjuvant chemotherapy, adjuvant radiation therapy, and anti-estrogen therapy with Tamoxifen beginning in 04/2017/.  She presents to the Survivorship Clinic for our initial meeting and routine follow-up post-completion of treatment for breast cancer.    1. Stage IA  right breast cancer:  Lisa Anthony is continuing to recover from definitive treatment for breast cancer. She will follow-up with her medical oncologist, Dr. Lindi Anthony in 6 months with history and physical exam per surveillance protocol.  She will continue her anti-estrogen therapy with Tamoxifen. Thus far, she is tolerating the Tamoxifen well, with minimal side effects. She was instructed to make Dr. Lindi Anthony or myself aware if she begins to experience any worsening side effects of the medication and I could see her back in clinic to help manage those side effects, as needed. Today, a comprehensive survivorship care plan and treatment summary was reviewed with the patient today detailing her breast cancer diagnosis, treatment course, potential late/long-term effects of treatment, appropriate follow-up care with recommendations for the future, and patient education resources.  A copy of this summary, along with a letter will be sent to the patient's primary care provider via mail/fax/In Basket message after today's visit.    2. Bone health:  Given Lisa Anthony age/history of breast cancer and due to the fact that she may possibly start Anastrozole in the near future, she is at risk for bone demineralization.  She has not previously undergone any bone density testing, therefore, I ordered a bone density test today to be done in 10/2017 when she has her next mammogram.  In the meantime, she was encouraged to increase her consumption of foods rich in calcium, as well as increase her weight-bearing activities.  She was given education on specific activities to promote bone health.  3. Cancer screening:  Due to Lisa Anthony history and her age, she should receive screening for skin cancers, colon cancer, and gynecologic cancers.  The information and recommendations are listed on the patient's comprehensive care plan/treatment summary and were reviewed in detail with the patient.    4. Health maintenance and wellness  promotion: Lisa Anthony was encouraged to consume 5-7 servings of fruits and vegetables per day. We reviewed the "Nutrition Rainbow" handout, as well as the handout "Take Control of Your Health and Reduce Your Cancer Risk" from the [facility].  She was also encouraged to engage in moderate to vigorous exercise for 30 minutes per day most days of the week. We discussed the [employer] fitness program, which is designed for cancer survivors to help them become more physically fit after cancer treatments.  She was instructed to limit her alcohol consumption and continue to abstain from tobacco use.     5. Support services/counseling: It is not uncommon for this period of the patient's cancer care trajectory to be one of many emotions and stressors.  We discussed an opportunity for her to participate in the next session of [facility] ("Finding Your New  Normal") support group series designed for patients after they have completed treatment.   Lisa Anthony was encouraged to take advantage of our many other support services programs, support groups, and/or counseling in coping with her new life as a cancer survivor after completing anti-cancer treatment.  She was offered support today through active listening and expressive supportive counseling.  She was given information regarding our available services and encouraged to contact me with any questions or for help enrolling in any of our support group/programs.    Dispo:   -Return to cancer center for follow up with Dr. Lindi Anthony in 02/2018  -Mammogram due in 10/2017 -Follow up with Dr. Marlou Starks in 08/2018 -She is welcome to return back to the Survivorship Clinic at any time; no additional follow-up needed at this time.  -Consider referral back to survivorship as a long-term survivor for continued surveillance  A total of (30) minutes of face-to-face time was spent with this patient with greater than 50% of that time in counseling and  care-coordination.   Gardenia Phlegm, NP Survivorship Program [place] [phone]   Note: PRIMARY CARE PROVIDER Patient, No Pcp Per None None

## 2017-08-23 ENCOUNTER — Telehealth: Payer: Self-pay | Admitting: Hematology and Oncology

## 2017-08-23 NOTE — Telephone Encounter (Signed)
Left message for patient regarding upcoming June appointments.  °

## 2017-08-31 IMAGING — DX DG CHEST 2V
2 series · 2 of 2 positions shown · non-contrast
Comparison: CT dated 12/11/2015 of

CLINICAL DATA: 49-year-old female with left-sided chest pain
radiating to the neck and shoulder blade

EXAM:
CHEST  2 VIEW

[chest pa]
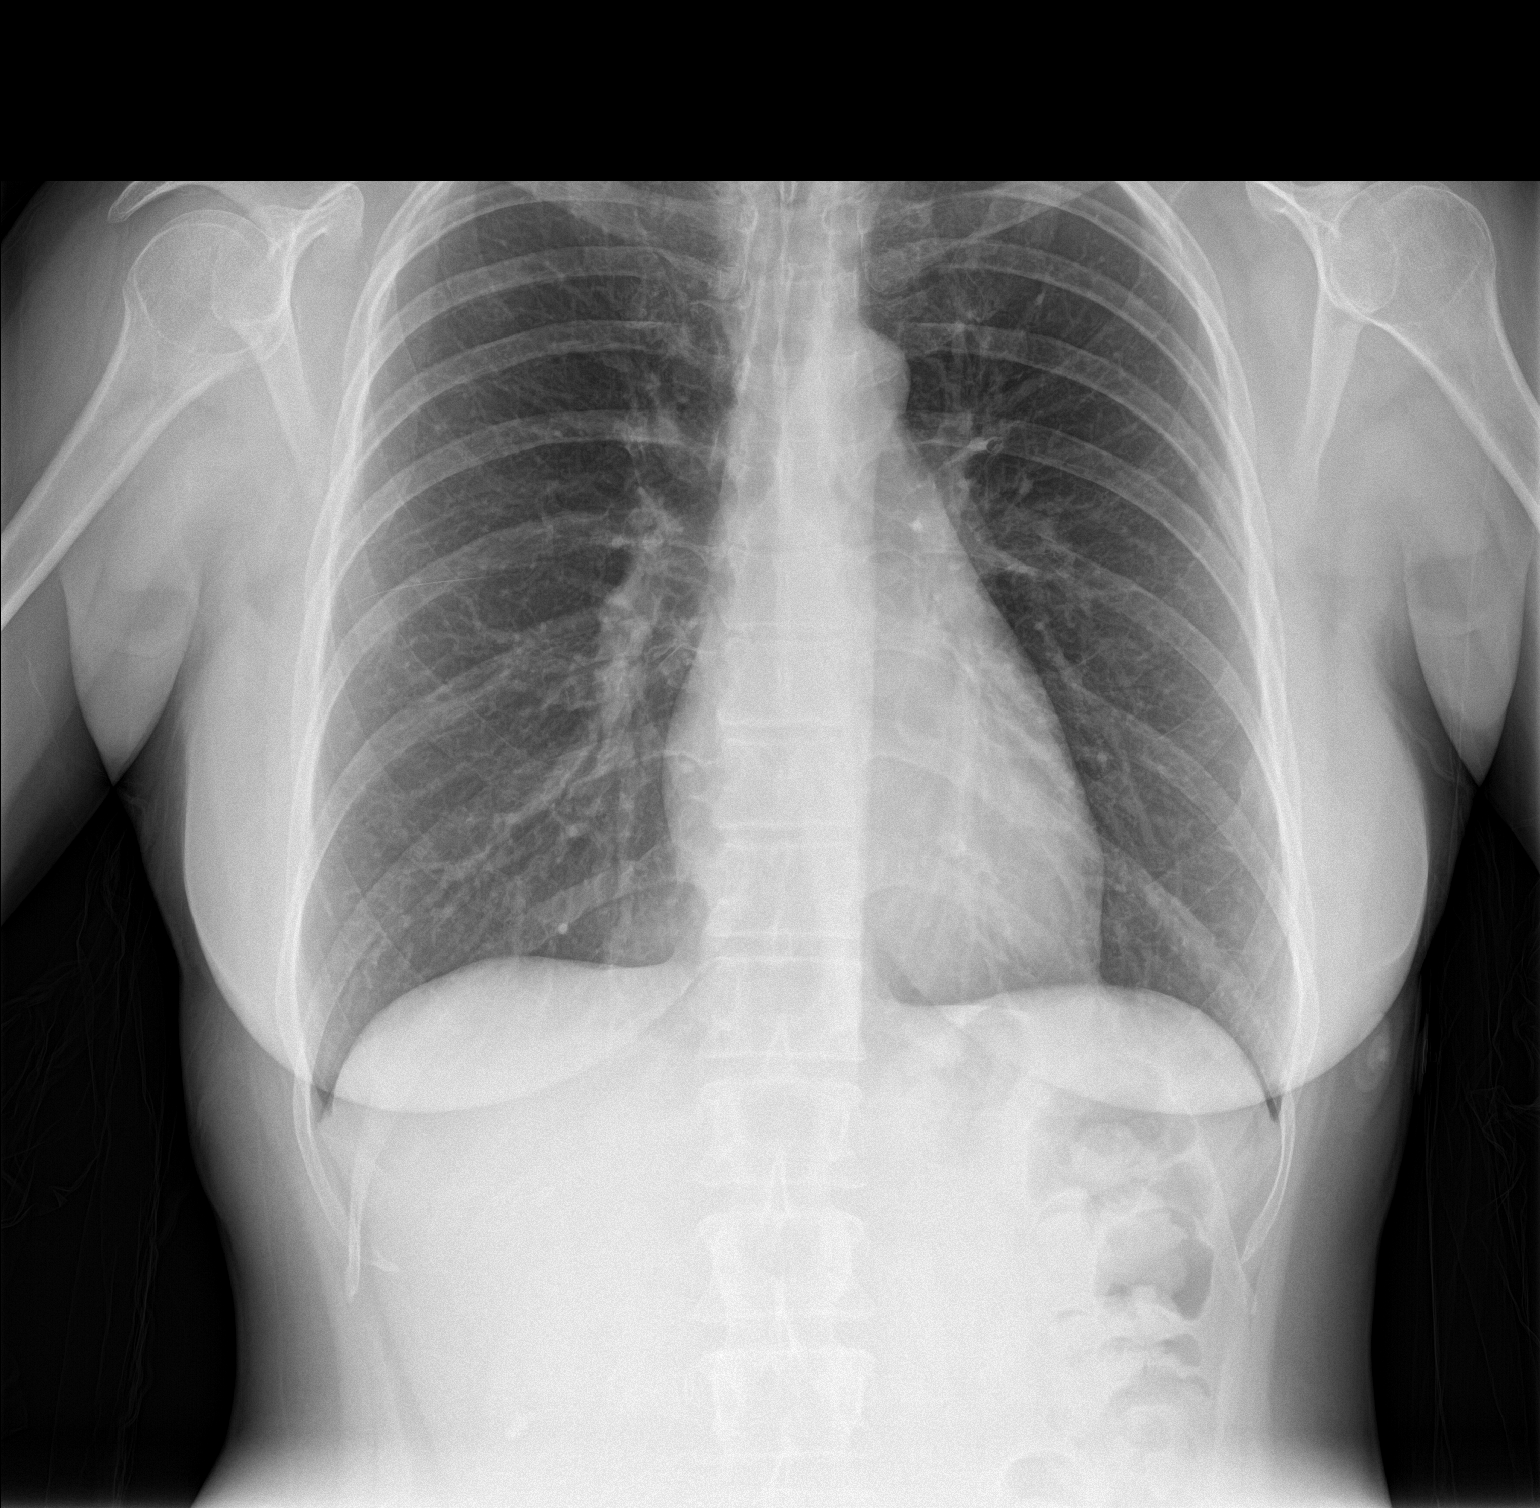

[chest lat]
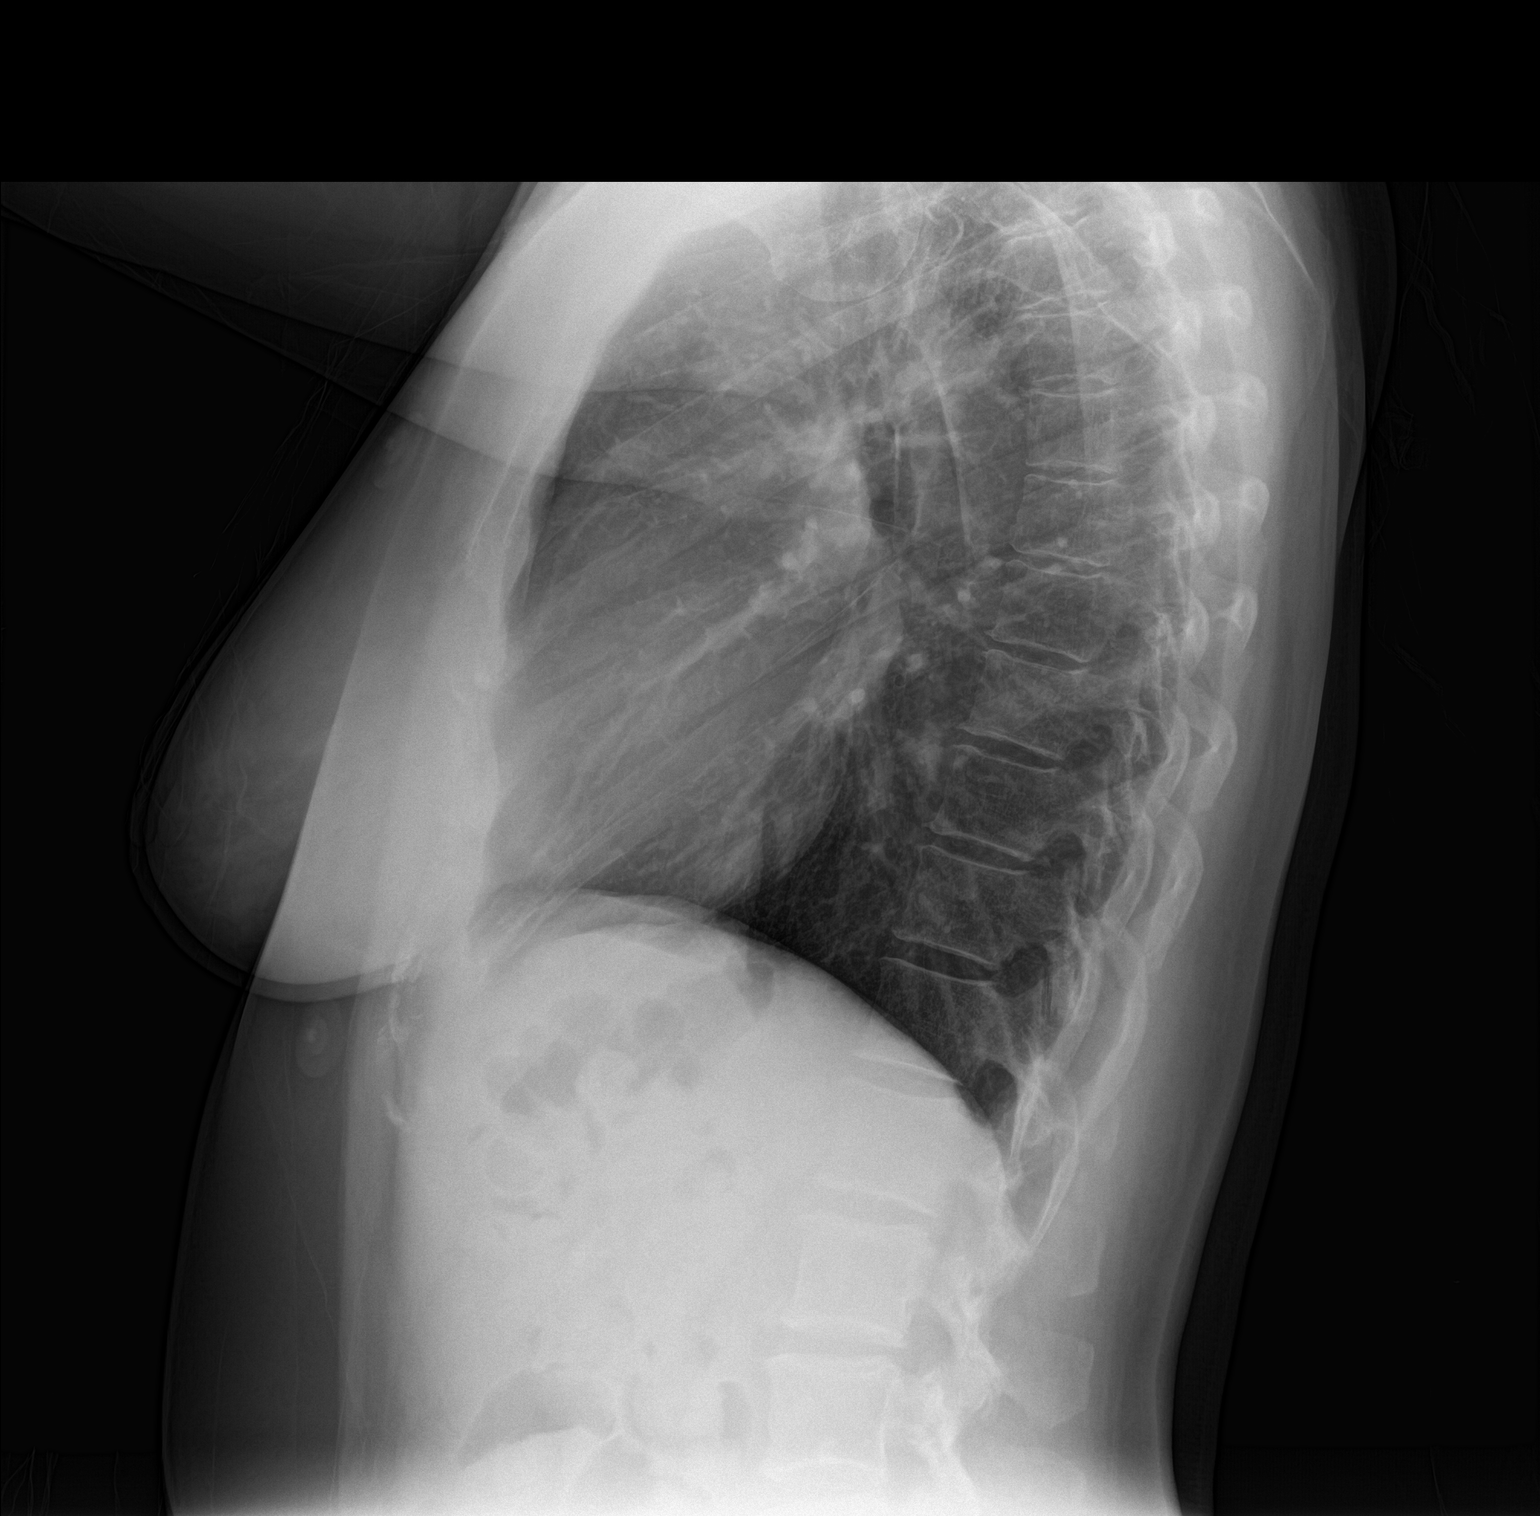

[2 of 2 positions shown; findings below may reference images not displayed]

FINDINGS: The heart size and mediastinal contours are within normal limits.
Both lungs are clear. The visualized skeletal structures are
unremarkable.
IMPRESSION: No active cardiopulmonary disease.

## 2017-10-10 DIAGNOSIS — L719 Rosacea, unspecified: Secondary | ICD-10-CM | POA: Diagnosis not present

## 2017-10-13 MED FILL — VENLAFAXINE HCL ER 75 MG CA: 75 | 90 days supply | Qty: 90 | Fill #1

## 2017-10-13 MED FILL — TAMOXIFEN CITRATE 20 MG TAB: 20 | 90 days supply | Qty: 90 | Fill #1

## 2017-10-17 ENCOUNTER — Ambulatory Visit
Admission: RE | Admit: 2017-10-17 | Discharge: 2017-10-17 | Disposition: A | Discharge status: Home / Self Care | Payer: 59 | Source: Ambulatory Visit | Attending: Adult Health | Admitting: Adult Health

## 2017-10-17 DIAGNOSIS — Z170 Estrogen receptor positive status [ER+]: Principal | ICD-10-CM

## 2017-10-17 DIAGNOSIS — M85851 Other specified disorders of bone density and structure, right thigh: Secondary | ICD-10-CM | POA: Diagnosis not present

## 2017-10-17 DIAGNOSIS — E2839 Other primary ovarian failure: Secondary | ICD-10-CM

## 2017-10-17 DIAGNOSIS — C50211 Malignant neoplasm of upper-inner quadrant of right female breast: Secondary | ICD-10-CM

## 2017-10-17 DIAGNOSIS — R922 Inconclusive mammogram: Secondary | ICD-10-CM | POA: Diagnosis not present

## 2017-12-21 ENCOUNTER — Other Ambulatory Visit: Payer: Self-pay

## 2017-12-21 ENCOUNTER — Ambulatory Visit (INDEPENDENT_AMBULATORY_CARE_PROVIDER_SITE_OTHER): Payer: 59 | Admitting: Obstetrics and Gynecology

## 2017-12-21 ENCOUNTER — Other Ambulatory Visit (HOSPITAL_COMMUNITY)
Admission: RE | Admit: 2017-12-21 | Discharge: 2017-12-21 | Disposition: A | Discharge status: Home / Self Care | Payer: 59 | Source: Ambulatory Visit | Attending: Obstetrics and Gynecology | Admitting: Obstetrics and Gynecology

## 2017-12-21 ENCOUNTER — Encounter: Payer: Self-pay | Admitting: Obstetrics and Gynecology

## 2017-12-21 VITALS — BP 122/80 | HR 84 | Resp 14 | Ht 65.0 in | Wt 157.0 lb

## 2017-12-21 DIAGNOSIS — Z01419 Encounter for gynecological examination (general) (routine) without abnormal findings: Secondary | ICD-10-CM | POA: Diagnosis not present

## 2017-12-21 DIAGNOSIS — Z124 Encounter for screening for malignant neoplasm of cervix: Secondary | ICD-10-CM | POA: Diagnosis not present

## 2017-12-21 DIAGNOSIS — Z853 Personal history of malignant neoplasm of breast: Secondary | ICD-10-CM | POA: Diagnosis not present

## 2017-12-21 DIAGNOSIS — Z9889 Other specified postprocedural states: Secondary | ICD-10-CM

## 2017-12-21 DIAGNOSIS — Z79810 Long term (current) use of selective estrogen receptor modulators (SERMs): Secondary | ICD-10-CM

## 2017-12-21 DIAGNOSIS — E559 Vitamin D deficiency, unspecified: Secondary | ICD-10-CM

## 2017-12-21 DIAGNOSIS — N939 Abnormal uterine and vaginal bleeding, unspecified: Secondary | ICD-10-CM

## 2017-12-21 DIAGNOSIS — Z1211 Encounter for screening for malignant neoplasm of colon: Secondary | ICD-10-CM

## 2017-12-21 DIAGNOSIS — M8580 Other specified disorders of bone density and structure, unspecified site: Secondary | ICD-10-CM | POA: Insufficient documentation

## 2017-12-21 DIAGNOSIS — Z0000 Encounter for general adult medical examination without abnormal findings: Secondary | ICD-10-CM

## 2017-12-21 NOTE — Patient Instructions (Signed)

## 2017-12-21 NOTE — Progress Notes (Signed)
52 y.o. [id number] DivorcedCaucasianF here for annual exam.   Diagnosed and treated for right breast cancer last year with lumpectomy, chemo and radiation. She is currently on tamoxifen. She had negative genetic testing. She has a h/o endometrial ablation, was still having cycles. Stopped having cycles with chemotherapy. Vasomotor symptoms are tolerable on Effexor.  She has been divorced for 6 years, great friends. He was a good support for her. She was sexually active with him x 1 last year. No STD concern. She had slight bleeding after being sexually active several month ago, no pain, doesn't think she tore.     Patient's last menstrual period was 12/21/2016.          Sexually active: No.  The current method of family planning is Essure/ Chemo.    Exercising: No.  The patient does not participate in regular exercise at present. Smoker:  Former smoker   Health Maintenance: Pap:  01-10-12 WNL per patient  History of abnormal Pap:  no MMG:  10-17-17 WNL  Colonoscopy:  Never BMD:   10-18-16 Osteopenia, with Oncologist TDaP:  01-01-16  Gardasil: N/A   reports that she quit smoking about 19 years ago. Her smoking use included cigarettes. She has never used smokeless tobacco. She reports that she drinks about 1.8 - 2.4 oz of alcohol per week. She reports that she does not use drugs. Works for [employer] in [employer].  3 kids: 25, 43 and 12. Oldest in college, nursing.   Past Medical History:  Diagnosis Date  . Amenorrhea   . Anxiety   . Breast cancer ([place])    right  . History of kidney stones    yrs ago     Past Surgical History:  Procedure Laterality Date  . BREAST LUMPECTOMY Right 11/18/2016   chemo and radiation  . BREAST LUMPECTOMY WITH RADIOACTIVE SEED AND SENTINEL LYMPH NODE BIOPSY Right 11/18/2016   Procedure: BREAST LUMPECTOMY WITH RADIOACTIVE SEED AND SENTINEL LYMPH NODE BIOPSY;  Surgeon: Autumn Messing III, MD;  Location: [facility];  Service: General;  Laterality: Right;  .  ENDOMETRIAL ABLATION    . HERNIA REPAIR    . PORT-A-CATH REMOVAL Left 03/14/2017   Procedure: REMOVAL PORT-A-CATH;  Surgeon: Jovita Kussmaul, MD;  Location: [facility];  Service: General;  Laterality: Left;  . PORTACATH PLACEMENT N/A 12/13/2016   Procedure: INSERTION PORT-A-CATH;  Surgeon: Autumn Messing III, MD;  Location: [facility];  Service: General;  Laterality: N/A;  Negative genetic testing.   Current Outpatient Medications  Medication Sig Dispense Refill  . Minocycline HCl 90 MG TB24 Take 1 tablet by mouth as needed.  6  . tamoxifen (NOLVADEX) 20 MG tablet Take 1 tablet (20 mg total) by mouth daily. 90 tablet 3  . venlafaxine XR (EFFEXOR-XR) 75 MG 24 hr capsule Take 1 capsule (75 mg total) by mouth daily with breakfast. 90 capsule 3   No current facility-administered medications for this visit.   minocycline prn for Rosacea   Family History  Problem Relation Age of Onset  . Breast cancer Maternal Grandmother 70       bilateral  . Glaucoma Mother   . Heart attack Father 100  . Hyperlipidemia Father   . Breast cancer Cousin 19  . Hypertension Sister     Review of Systems  Constitutional: Negative.   HENT: Negative.   Eyes: Negative.   Respiratory: Negative.   Cardiovascular: Negative.   Gastrointestinal: Negative.   Endocrine: Negative.   Genitourinary: Negative.  Musculoskeletal: Negative.   Skin: Negative.   Allergic/Immunologic: Negative.   Neurological: Negative.   Psychiatric/Behavioral: Negative.     Exam:   BP 122/80 (BP Location: Right Arm, Patient Position: Sitting, Cuff Size: Normal)   Pulse 84   Resp 14   Ht 5\' 5"  (1.651 m)   Wt 157 lb (71.2 kg)   LMP 12/21/2016   BMI 26.13 kg/m   Weight change: @WEIGHTCHANGE @ Height:   Height: 5\' 5"  (165.1 cm)  Ht Readings from Last 3 Encounters:  12/21/17 5\' 5"  (1.651 m)  08/22/17 5\' 3"  (1.6 m)  06/30/17 5\' 3"  (1.6 m)    General appearance: alert, cooperative and appears stated age Head: Normocephalic,  without obvious abnormality, atraumatic Neck: no adenopathy, supple, symmetrical, trachea midline and thyroid normal to inspection and palpation Lungs: clear to auscultation bilaterally Cardiovascular: regular rate and rhythm Breasts: normal appearance, no masses or tenderness, small scar over the right areolar region.  Abdomen: soft, non-tender; non distended,  no masses,  no organomegaly Extremities: extremities normal, atraumatic, no cyanosis or edema Skin: Skin color, texture, turgor normal. No rashes or lesions Lymph nodes: Cervical, supraclavicular, and axillary nodes normal. No abnormal inguinal nodes palpated Neurologic: Grossly normal   Pelvic: External genitalia:  no lesions              Urethra:  normal appearing urethra with no masses, tenderness or lesions              Bartholins and Skenes: normal                 Vagina: normal appearing vagina with normal color and discharge, no lesions              Cervix: no lesions               Bimanual Exam:  Uterus:  normal size, contour, position, consistency, mobility, non-tender and anteverted              Adnexa: no mass, fullness, tenderness               Rectovaginal: Confirms               Anus:  normal sphincter tone, no lesions  Chaperone was present for exam.  A:  Well Woman with normal exam  One episode of bleeding after sex in the last several months  H/O right breast cancer  Osteopenia  H/O vit d def  P:   Pap with hpv  Colonoscopy, we will schedule this for her  Mammogram UTD  Return for fasting labs and vit d  Discussed breast self exam  Discussed calcium and vit D intake  Return for ultrasound, possible sonohysterogram, possible biopsy

## 2017-12-23 ENCOUNTER — Telehealth: Payer: Self-pay | Admitting: Obstetrics and Gynecology

## 2017-12-23 LAB — CYTOLOGY - PAP
Diagnosis: NEGATIVE
HPV: NOT DETECTED

## 2017-12-23 NOTE — Telephone Encounter (Signed)
Call placed to convey benefits. 

## 2017-12-26 ENCOUNTER — Other Ambulatory Visit: Payer: 59

## 2017-12-26 DIAGNOSIS — Z0000 Encounter for general adult medical examination without abnormal findings: Secondary | ICD-10-CM | POA: Diagnosis not present

## 2017-12-26 DIAGNOSIS — M8580 Other specified disorders of bone density and structure, unspecified site: Secondary | ICD-10-CM

## 2017-12-26 DIAGNOSIS — E559 Vitamin D deficiency, unspecified: Secondary | ICD-10-CM

## 2017-12-26 NOTE — Telephone Encounter (Signed)
Second call placed to review benefits for recommended ultrasound. Left voicemail message requesting a return call

## 2017-12-27 ENCOUNTER — Telehealth: Payer: Self-pay | Admitting: *Deleted

## 2017-12-27 LAB — CBC
HEMOGLOBIN: 12.6 g/dL (ref 11.1–15.9)
Hematocrit: 38.7 % (ref 34.0–46.6)
MCH: 32 pg (ref 26.6–33.0)
MCHC: 32.6 g/dL (ref 31.5–35.7)
MCV: 98 fL — ABNORMAL HIGH (ref 79–97)
Platelets: 249 10*3/uL (ref 150–379)
RBC: 3.94 x10E6/uL (ref 3.77–5.28)
RDW: 13.7 % (ref 12.3–15.4)
WBC: 4.5 10*3/uL (ref 3.4–10.8)

## 2017-12-27 LAB — COMPREHENSIVE METABOLIC PANEL
A/G RATIO: 2.3 — AB (ref 1.2–2.2)
ALT: 75 IU/L — AB (ref 0–32)
AST: 79 IU/L — ABNORMAL HIGH (ref 0–40)
Albumin: 4.1 g/dL (ref 3.5–5.5)
Alkaline Phosphatase: 72 IU/L (ref 39–117)
BILIRUBIN TOTAL: 0.5 mg/dL (ref 0.0–1.2)
BUN/Creatinine Ratio: 17 (ref 9–23)
BUN: 13 mg/dL (ref 6–24)
CALCIUM: 9.2 mg/dL (ref 8.7–10.2)
CHLORIDE: 102 mmol/L (ref 96–106)
CO2: 24 mmol/L (ref 20–29)
Creatinine, Ser: 0.78 mg/dL (ref 0.57–1.00)
GFR calc Af Amer: 102 mL/min/{1.73_m2} (ref >59)
GFR calc non Af Amer: 88 mL/min/{1.73_m2} (ref >59)
GLUCOSE: 92 mg/dL (ref 65–99)
Globulin, Total: 1.8 g/dL (ref 1.5–4.5)
POTASSIUM: 4.7 mmol/L (ref 3.5–5.2)
Sodium: 141 mmol/L (ref 134–144)
Total Protein: 5.9 g/dL — ABNORMAL LOW (ref 6.0–8.5)

## 2017-12-27 LAB — LIPID PANEL
Chol/HDL Ratio: 3 ratio (ref 0.0–4.4)
Cholesterol, Total: 235 mg/dL — ABNORMAL HIGH (ref 100–199)
HDL: 78 mg/dL (ref >39)
LDL CALC: 139 mg/dL — AB (ref 0–99)
TRIGLYCERIDES: 90 mg/dL (ref 0–149)
VLDL Cholesterol Cal: 18 mg/dL (ref 5–40)

## 2017-12-27 LAB — VITAMIN D 25 HYDROXY (VIT D DEFICIENCY, FRACTURES): Vit D, 25-Hydroxy: 14.2 ng/mL — ABNORMAL LOW (ref 30.0–100.0)

## 2017-12-27 NOTE — Telephone Encounter (Signed)
-----   Message from Salvadore Dom, MD sent at 12/27/2017 12:10 PM EDT ----- Please inform the patient that her vit d is low and have her take 2,000 IU of vit d 3 daily. She should have a f/u vit d in 3 months Her Total and LDL cholesterol are mildly elevated.  I would recommend a mediterranean diet and regular exercise.  A mediterranean diet is high in fruits, vegetables, whole grains, fish, chicken, nuts, healthy fats (olive oil or canola oil). Low fat dairy. Limit butter, margarine, red meat and sweets.  Her LFT's were elevated. She really should see a primary MD for evaluation (she could also discuss it with her Oncologist) Please send a copy of her labs to her Oncologist.

## 2017-12-27 NOTE — Telephone Encounter (Signed)
Spoke with patient regarding benefit for sonohysterogram with possible endometrial biopsy. Patient understood and agreeable. Patient ready to schedule. Patient does not have a cycle. Patient scheduled 01/10/18 with Dr Talbert Nan. Patient aware of appointment date, arrival time and cancellation policy. No further questions.   Will close encounter  Forwarding to Dr Talbert Nan

## 2017-12-27 NOTE — Telephone Encounter (Signed)
Left message to call regarding lab results -eh 

## 2017-12-27 NOTE — Telephone Encounter (Signed)
Spoke with patient and gave results and recommendations. Patient is scheduled for 3 month F/U Vitamin D. A copy of labs were sent to Oncologist -eh

## 2017-12-28 ENCOUNTER — Encounter: Payer: Self-pay | Admitting: Hematology and Oncology

## 2018-01-10 ENCOUNTER — Encounter: Payer: Self-pay | Admitting: Obstetrics and Gynecology

## 2018-01-10 ENCOUNTER — Ambulatory Visit (INDEPENDENT_AMBULATORY_CARE_PROVIDER_SITE_OTHER): Payer: 59

## 2018-01-10 ENCOUNTER — Other Ambulatory Visit: Payer: Self-pay | Admitting: Obstetrics and Gynecology

## 2018-01-10 ENCOUNTER — Other Ambulatory Visit: Payer: Self-pay

## 2018-01-10 ENCOUNTER — Ambulatory Visit: Payer: 59 | Admitting: Obstetrics and Gynecology

## 2018-01-10 VITALS — BP 114/64 | HR 70 | Resp 16 | Ht 65.0 in | Wt 157.0 lb

## 2018-01-10 DIAGNOSIS — N939 Abnormal uterine and vaginal bleeding, unspecified: Secondary | ICD-10-CM

## 2018-01-10 DIAGNOSIS — Z853 Personal history of malignant neoplasm of breast: Secondary | ICD-10-CM

## 2018-01-10 DIAGNOSIS — Z9889 Other specified postprocedural states: Secondary | ICD-10-CM

## 2018-01-10 DIAGNOSIS — R945 Abnormal results of liver function studies: Secondary | ICD-10-CM

## 2018-01-10 NOTE — Progress Notes (Signed)
Called pt and lvm with call back number to confirm CT Scan appt this Friday.

## 2018-01-10 NOTE — Progress Notes (Addendum)
GYNECOLOGY  VISIT   HPI: 52 y.o.   Divorced  Caucasian  female   [phone] with Patient's last menstrual period was 12/21/2016.   here for pelvic US for PMP, postcoital bleeding. The patient has a h/o an endometrial ablation and breast cancer. She was having cycles until she started chemotherapy. She is on Tamoxifen.    Recent lab work with mildly elevated LFT's. Oncologist feels it could be from her Tamoxifen, setting her up for a CT. May change her medication.   GYNECOLOGIC HISTORY: Patient's last menstrual period was 12/21/2016. Contraception: none Menopausal hormone therapy: none        OB History    Gravida  3   Para  3   Term  3   Preterm      AB      Living  3     SAB      TAB      Ectopic      Multiple      Live Births  3              Patient Active Problem List   Diagnosis Date Noted  . Osteopenia 12/21/2017  . Vitamin D deficiency 12/21/2017  . Port catheter in place 12/21/2016  . Genetic testing 11/17/2016  . Malignant neoplasm of upper-inner quadrant of right breast in female, estrogen receptor positive ([place]) 10/27/2016    Past Medical History:  Diagnosis Date  . Amenorrhea   . Anxiety   . Breast cancer ([place])    right  . History of kidney stones    yrs ago     Past Surgical History:  Procedure Laterality Date  . BREAST LUMPECTOMY Right 11/18/2016   chemo and radiation  . BREAST LUMPECTOMY WITH RADIOACTIVE SEED AND SENTINEL LYMPH NODE BIOPSY Right 11/18/2016   Procedure: BREAST LUMPECTOMY WITH RADIOACTIVE SEED AND SENTINEL LYMPH NODE BIOPSY;  Surgeon: Autumn Messing III, MD;  Location: [facility];  Service: General;  Laterality: Right;  . ENDOMETRIAL ABLATION    . HERNIA REPAIR    . PORT-A-CATH REMOVAL Left 03/14/2017   Procedure: REMOVAL PORT-A-CATH;  Surgeon: Jovita Kussmaul, MD;  Location: [facility];  Service: General;  Laterality: Left;  . PORTACATH PLACEMENT N/A 12/13/2016   Procedure: INSERTION  PORT-A-CATH;  Surgeon: Autumn Messing III, MD;  Location: [facility];  Service: General;  Laterality: N/A;    Current Outpatient Medications  Medication Sig Dispense Refill  . Minocycline HCl 90 MG TB24 Take 1 tablet by mouth as needed.  6  . tamoxifen (NOLVADEX) 20 MG tablet Take 1 tablet (20 mg total) by mouth daily. 90 tablet 3  . venlafaxine XR (EFFEXOR-XR) 75 MG 24 hr capsule Take 1 capsule (75 mg total) by mouth daily with breakfast. 90 capsule 3   No current facility-administered medications for this visit.    Minocycline for Rosacea Effexor for vasomotor symptoms   ALLERGIES: Sulfa antibiotics  Family History  Problem Relation Age of Onset  . Breast cancer Maternal Grandmother 53       bilateral  . Glaucoma Mother   . Heart attack Father 109  . Hyperlipidemia Father   . Breast cancer Cousin 34  . Hypertension Sister     Social History   Socioeconomic History  . Marital status: Divorced    Spouse name: Not on file  . Number of children: Not on file  . Years of education: Not on file  . Highest education  level: Not on file  Occupational History  . Not on file  Social Needs  . Financial resource strain: Not on file  . Food insecurity:    Worry: Not on file    Inability: Not on file  . Transportation needs:    Medical: Not on file    Non-medical: Not on file  Tobacco Use  . Smoking status: Former Smoker    Types: Cigarettes    Last attempt to quit: 03/26/1998    Years since quitting: 19.8  . Smokeless tobacco: Never Used  Substance and Sexual Activity  . Alcohol use: Yes    Alcohol/week: 1.8 - 2.4 oz    Types: 3 - 4 Standard drinks or equivalent per week    Comment: 3-4 on weekends  . Drug use: No  . Sexual activity: Not Currently    Partners: Male    Birth control/protection: Other-see comments    Comment: ESSURE  Lifestyle  . Physical activity:    Days per week: Not on file    Minutes per session: Not on file  . Stress: Not on file  Relationships  . Social  connections:    Talks on phone: Not on file    Gets together: Not on file    Attends religious service: Not on file    Active member of club or organization: Not on file    Attends meetings of clubs or organizations: Not on file    Relationship status: Not on file  . Intimate partner violence:    Fear of current or ex partner: Not on file    Emotionally abused: Not on file    Physically abused: Not on file    Forced sexual activity: Not on file  Other Topics Concern  . Not on file  Social History Narrative  . Not on file    Review of Systems  Constitutional: Negative.   HENT: Negative.   Eyes: Negative.   Respiratory: Negative.   Cardiovascular: Negative.   Gastrointestinal: Negative.   Genitourinary: Negative.   Musculoskeletal: Negative.   Skin: Negative.   Neurological: Negative.   Endo/Heme/Allergies: Negative.   Psychiatric/Behavioral: Negative.     PHYSICAL EXAMINATION:    BP 114/64 (BP Location: Right Arm, Patient Position: Sitting, Cuff Size: Normal)   Pulse 70   Resp 16   Ht 5\' 5"  (1.651 m)   Wt 157 lb (71.2 kg)   LMP 12/21/2016   BMI 26.13 kg/m     General appearance: alert, cooperative and appears stated age Neck: no adenopathy, supple, symmetrical, trachea midline and thyroid normal to inspection and palpation Heart: regular rate and rhythm Lungs: CTAB Abdomen: soft, non-tender; bowel sounds normal; no masses,  no organomegaly Extremities: normal, atraumatic, no cyanosis Skin: normal color, texture and turgor, no rashes or lesions Lymph: normal cervical supraclavicular and inguinal nodes Neurologic: grossly normal   Pelvic: External genitalia:  no lesions              Urethra:  normal appearing urethra with no masses, tenderness or lesions              Bartholins and Skenes: normal                 Vagina: normal appearing vagina with normal color and discharge, no lesions              Cervix: no lesions  Chaperone was present for  exam.  ASSESSMENT PMP, Post coital bleeding.  H/O breast cancer, on tamoxifen  H/O endometrial ablation Recently with mildly elevated LFT's, being evaluated by Oncology    PLAN Unable to get into her cavity, even with mini-dilators Hysteroscopy, D&C under ultrasound guidance   An After Visit Summary was printed and given to the patient.  20 minutes face to face time of which over 50% was spent in counseling.    Addendum: right ovarian cyst, c/w follicle 7 mm

## 2018-01-12 ENCOUNTER — Telehealth: Payer: Self-pay | Admitting: Obstetrics and Gynecology

## 2018-01-12 NOTE — Telephone Encounter (Signed)
Call to patient. Surgery date options discussed. Patient requests 02-06-18.Surgery instructions reviewed and printed copy will be mailed with surgery center brochure. To call back if additional questions.   Routing to provider for final review. Patient agreeable to disposition. Will close encounter.

## 2018-01-12 NOTE — Telephone Encounter (Signed)
Spoke with patient regarding benefit for surgery. Patient understood and agreeable. Patient has confirmed and is ready to proceed with scheduling. Patient aware this is professional benefit only. Patient aware will be contacted by hospital for separate benefits. Forwarding to Nurse Supervisor for scheduling ° °Routing to Sally Yeakley, RN °

## 2018-01-13 ENCOUNTER — Ambulatory Visit (HOSPITAL_COMMUNITY)
Admission: RE | Admit: 2018-01-13 | Discharge: 2018-01-13 | Disposition: A | Discharge status: Home / Self Care | Payer: 59 | Source: Ambulatory Visit | Attending: Adult Health | Admitting: Adult Health

## 2018-01-13 DIAGNOSIS — R945 Abnormal results of liver function studies: Secondary | ICD-10-CM | POA: Diagnosis not present

## 2018-01-13 DIAGNOSIS — N200 Calculus of kidney: Secondary | ICD-10-CM | POA: Insufficient documentation

## 2018-01-13 DIAGNOSIS — K769 Liver disease, unspecified: Secondary | ICD-10-CM | POA: Insufficient documentation

## 2018-01-13 MED ORDER — IOHEXOL 300 MG/ML  SOLN
100.0000 mL | Freq: Once | INTRAMUSCULAR | Status: AC | PRN
  Administered 2018-01-13: 100 mL via INTRAVENOUS

## 2018-01-16 ENCOUNTER — Encounter: Payer: Self-pay | Admitting: Hematology and Oncology

## 2018-01-16 ENCOUNTER — Other Ambulatory Visit: Payer: Self-pay

## 2018-01-16 DIAGNOSIS — C50211 Malignant neoplasm of upper-inner quadrant of right female breast: Secondary | ICD-10-CM

## 2018-01-16 DIAGNOSIS — Z170 Estrogen receptor positive status [ER+]: Principal | ICD-10-CM

## 2018-01-16 MED FILL — TAMOXIFEN CITRATE 20 MG TAB: 20 | 90 days supply | Qty: 90 | Fill #2

## 2018-01-16 MED FILL — VENLAFAXINE HCL ER 75 MG CA: 75 | 90 days supply | Qty: 90 | Fill #2

## 2018-01-17 ENCOUNTER — Telehealth: Payer: Self-pay

## 2018-01-17 NOTE — Telephone Encounter (Signed)
LVM to schedule patient for MRI. Referral for urology sent.  Cyndia Bent RN

## 2018-01-18 ENCOUNTER — Encounter: Payer: Self-pay | Admitting: Adult Health

## 2018-01-19 ENCOUNTER — Other Ambulatory Visit: Payer: Self-pay | Admitting: Adult Health

## 2018-01-19 DIAGNOSIS — N200 Calculus of kidney: Secondary | ICD-10-CM

## 2018-01-19 DIAGNOSIS — K769 Liver disease, unspecified: Secondary | ICD-10-CM

## 2018-01-19 DIAGNOSIS — Z170 Estrogen receptor positive status [ER+]: Principal | ICD-10-CM

## 2018-01-19 DIAGNOSIS — C50211 Malignant neoplasm of upper-inner quadrant of right female breast: Secondary | ICD-10-CM

## 2018-01-19 NOTE — Progress Notes (Signed)
Reviewed results with patient in detail.  She will be referred to urology, and will order MRI liver.  She verbalizes understanding.     Wilber Bihari, NP

## 2018-01-20 ENCOUNTER — Encounter: Payer: Self-pay | Admitting: Internal Medicine

## 2018-01-23 ENCOUNTER — Ambulatory Visit (HOSPITAL_COMMUNITY)
Admission: RE | Admit: 2018-01-23 | Discharge: 2018-01-23 | Disposition: A | Discharge status: Home / Self Care | Payer: 59 | Source: Ambulatory Visit | Attending: Adult Health | Admitting: Adult Health

## 2018-01-23 DIAGNOSIS — Z170 Estrogen receptor positive status [ER+]: Secondary | ICD-10-CM | POA: Insufficient documentation

## 2018-01-23 DIAGNOSIS — K760 Fatty (change of) liver, not elsewhere classified: Secondary | ICD-10-CM | POA: Diagnosis not present

## 2018-01-23 DIAGNOSIS — K769 Liver disease, unspecified: Secondary | ICD-10-CM | POA: Diagnosis not present

## 2018-01-23 DIAGNOSIS — C50211 Malignant neoplasm of upper-inner quadrant of right female breast: Secondary | ICD-10-CM | POA: Insufficient documentation

## 2018-01-23 MED ORDER — GADOBENATE DIMEGLUMINE 529 MG/ML IV SOLN
15.0000 mL | Freq: Once | INTRAVENOUS | Status: AC | PRN
  Administered 2018-01-23: 15 mL via INTRAVENOUS

## 2018-01-27 ENCOUNTER — Encounter (HOSPITAL_BASED_OUTPATIENT_CLINIC_OR_DEPARTMENT_OTHER): Payer: Self-pay

## 2018-01-30 NOTE — H&P (Signed)
GYNECOLOGY  VISIT   HPI: 52 y.o.   Divorced  Caucasian  female   [phone] with Patient's last menstrual period was 12/21/2016.   here for pelvic US for PMP, postcoital bleeding. The patient has a h/o an endometrial ablation and breast cancer. She was having cycles until she started chemotherapy. She is on Tamoxifen.    Recent lab work with mildly elevated LFT's. Oncologist feels it could be from her Tamoxifen, setting her up for a CT. May change her medication.   GYNECOLOGIC HISTORY: Patient's last menstrual period was 12/21/2016. Contraception: none Menopausal hormone therapy: none                OB History    Gravida  3   Para  3   Term  3   Preterm      AB      Living  3     SAB      TAB      Ectopic      Multiple      Live Births  3                  Patient Active Problem List   Diagnosis Date Noted  . Osteopenia 12/21/2017  . Vitamin D deficiency 12/21/2017  . Port catheter in place 12/21/2016  . Genetic testing 11/17/2016  . Malignant neoplasm of upper-inner quadrant of right breast in female, estrogen receptor positive ([place]) 10/27/2016        Past Medical History:  Diagnosis Date  . Amenorrhea   . Anxiety   . Breast cancer ([place])    right  . History of kidney stones    yrs ago          Past Surgical History:  Procedure Laterality Date  . BREAST LUMPECTOMY Right 11/18/2016   chemo and radiation  . BREAST LUMPECTOMY WITH RADIOACTIVE SEED AND SENTINEL LYMPH NODE BIOPSY Right 11/18/2016   Procedure: BREAST LUMPECTOMY WITH RADIOACTIVE SEED AND SENTINEL LYMPH NODE BIOPSY;  Surgeon: Autumn Messing III, MD;  Location: [facility];  Service: General;  Laterality: Right;  . ENDOMETRIAL ABLATION    . HERNIA REPAIR    . PORT-A-CATH REMOVAL Left 03/14/2017   Procedure: REMOVAL PORT-A-CATH;  Surgeon: Jovita Kussmaul, MD;  Location: [facility];  Service: General;  Laterality: Left;  .  PORTACATH PLACEMENT N/A 12/13/2016   Procedure: INSERTION PORT-A-CATH;  Surgeon: Autumn Messing III, MD;  Location: [facility];  Service: General;  Laterality: N/A;          Current Outpatient Medications  Medication Sig Dispense Refill  . Minocycline HCl 90 MG TB24 Take 1 tablet by mouth as needed.  6  . tamoxifen (NOLVADEX) 20 MG tablet Take 1 tablet (20 mg total) by mouth daily. 90 tablet 3  . venlafaxine XR (EFFEXOR-XR) 75 MG 24 hr capsule Take 1 capsule (75 mg total) by mouth daily with breakfast. 90 capsule 3   No current facility-administered medications for this visit.    Minocycline for Rosacea Effexor for vasomotor symptoms   ALLERGIES: Sulfa antibiotics       Family History  Problem Relation Age of Onset  . Breast cancer Maternal Grandmother 62       bilateral  . Glaucoma Mother   . Heart attack Father 25  . Hyperlipidemia Father   . Breast cancer Cousin 37  . Hypertension Sister     Social History  Socioeconomic History  . Marital status: Divorced    Spouse name: Not on file  . Number of children: Not on file  . Years of education: Not on file  . Highest education level: Not on file  Occupational History  . Not on file  Social Needs  . Financial resource strain: Not on file  . Food insecurity:    Worry: Not on file    Inability: Not on file  . Transportation needs:    Medical: Not on file    Non-medical: Not on file  Tobacco Use  . Smoking status: Former Smoker    Types: Cigarettes    Last attempt to quit: 03/26/1998    Years since quitting: 19.8  . Smokeless tobacco: Never Used  Substance and Sexual Activity  . Alcohol use: Yes    Alcohol/week: 1.8 - 2.4 oz    Types: 3 - 4 Standard drinks or equivalent per week    Comment: 3-4 on weekends  . Drug use: No  . Sexual activity: Not Currently    Partners: Male    Birth control/protection: Other-see comments    Comment: ESSURE  Lifestyle  . Physical activity:      Days per week: Not on file    Minutes per session: Not on file  . Stress: Not on file  Relationships  . Social connections:    Talks on phone: Not on file    Gets together: Not on file    Attends religious service: Not on file    Active member of club or organization: Not on file    Attends meetings of clubs or organizations: Not on file    Relationship status: Not on file  . Intimate partner violence:    Fear of current or ex partner: Not on file    Emotionally abused: Not on file    Physically abused: Not on file    Forced sexual activity: Not on file  Other Topics Concern  . Not on file  Social History Narrative  . Not on file    Review of Systems  Constitutional: Negative.   HENT: Negative.   Eyes: Negative.   Respiratory: Negative.   Cardiovascular: Negative.   Gastrointestinal: Negative.   Genitourinary: Negative.   Musculoskeletal: Negative.   Skin: Negative.   Neurological: Negative.   Endo/Heme/Allergies: Negative.   Psychiatric/Behavioral: Negative.     PHYSICAL EXAMINATION:    BP 114/64 (BP Location: Right Arm, Patient Position: Sitting, Cuff Size: Normal)   Pulse 70   Resp 16   Ht 5\' 5"  (1.651 m)   Wt 157 lb (71.2 kg)   LMP 12/21/2016   BMI 26.13 kg/m     General appearance: alert, cooperative and appears stated age Neck: no adenopathy, supple, symmetrical, trachea midline and thyroid normal to inspection and palpation Heart: regular rate and rhythm Lungs: CTAB Abdomen: soft, non-tender; bowel sounds normal; no masses,  no organomegaly Extremities: normal, atraumatic, no cyanosis Skin: normal color, texture and turgor, no rashes or lesions Lymph: normal cervical supraclavicular and inguinal nodes Neurologic: grossly normal   Pelvic: External genitalia:  no lesions              Urethra:  normal appearing urethra with no masses, tenderness or lesions              Bartholins and Skenes: normal                  Vagina: normal appearing vagina with normal color and discharge,  no lesions              Cervix: no lesions  Chaperone was present for exam.  ASSESSMENT PMP, Post coital bleeding.  H/O breast cancer, on tamoxifen H/O endometrial ablation Recently with mildly elevated LFT's, being evaluated by Oncology    PLAN Unable to get into her cavity, even with mini-dilators Hysteroscopy, D&C under ultrasound guidance   An After Visit Summary was printed and given to the patient.  20 minutes face to face time of which over 50% was spent in counseling.    Addendum: right ovarian cyst, c/w follicle 7 mm

## 2018-01-31 ENCOUNTER — Encounter (HOSPITAL_BASED_OUTPATIENT_CLINIC_OR_DEPARTMENT_OTHER): Payer: Self-pay

## 2018-01-31 ENCOUNTER — Other Ambulatory Visit: Payer: Self-pay

## 2018-01-31 NOTE — Progress Notes (Signed)
Received call via phone from sally, giving heads up about ultrasound intraopertive imaging ordered for day of surgery. Released [place] ordered today, this will go to ultrasound department for them to schedule.

## 2018-01-31 NOTE — Progress Notes (Signed)
Spoke with:  Marita [place] NPO:  After Midnight, no gum, candy, or mints   Arrival time: 5615PP Labs:  CBC, Hepatic Function Panel 02/01/2018 in epic AM medications:  Tamoxifen, Venlafaxine Pre op orders:  Yes Ride home:  Tory Emerald (sister) [phone]

## 2018-02-01 ENCOUNTER — Encounter (HOSPITAL_COMMUNITY)
Admission: RE | Admit: 2018-02-01 | Discharge: 2018-02-01 | Disposition: A | Discharge status: Home / Self Care | Payer: 59 | Source: Ambulatory Visit | Attending: Obstetrics and Gynecology | Admitting: Obstetrics and Gynecology

## 2018-02-01 DIAGNOSIS — R7989 Other specified abnormal findings of blood chemistry: Secondary | ICD-10-CM | POA: Diagnosis not present

## 2018-02-01 DIAGNOSIS — N930 Postcoital and contact bleeding: Secondary | ICD-10-CM | POA: Insufficient documentation

## 2018-02-01 DIAGNOSIS — F419 Anxiety disorder, unspecified: Secondary | ICD-10-CM | POA: Insufficient documentation

## 2018-02-01 DIAGNOSIS — Z882 Allergy status to sulfonamides status: Secondary | ICD-10-CM | POA: Diagnosis not present

## 2018-02-01 DIAGNOSIS — Z87442 Personal history of urinary calculi: Secondary | ICD-10-CM | POA: Diagnosis not present

## 2018-02-01 DIAGNOSIS — Z87891 Personal history of nicotine dependence: Secondary | ICD-10-CM | POA: Insufficient documentation

## 2018-02-01 DIAGNOSIS — Z8249 Family history of ischemic heart disease and other diseases of the circulatory system: Secondary | ICD-10-CM | POA: Diagnosis not present

## 2018-02-01 DIAGNOSIS — Z79810 Long term (current) use of selective estrogen receptor modulators (SERMs): Secondary | ICD-10-CM | POA: Insufficient documentation

## 2018-02-01 DIAGNOSIS — Z79899 Other long term (current) drug therapy: Secondary | ICD-10-CM | POA: Diagnosis not present

## 2018-02-01 DIAGNOSIS — Z01812 Encounter for preprocedural laboratory examination: Secondary | ICD-10-CM | POA: Insufficient documentation

## 2018-02-01 DIAGNOSIS — Z853 Personal history of malignant neoplasm of breast: Secondary | ICD-10-CM | POA: Insufficient documentation

## 2018-02-01 LAB — CBC
HCT: 40.9 % (ref 36.0–46.0)
HEMOGLOBIN: 13.6 g/dL (ref 12.0–15.0)
MCH: 32.3 pg (ref 26.0–34.0)
MCHC: 33.3 g/dL (ref 30.0–36.0)
MCV: 97.1 fL (ref 78.0–100.0)
Platelets: 228 10*3/uL (ref 150–400)
RBC: 4.21 MIL/uL (ref 3.87–5.11)
RDW: 12.8 % (ref 11.5–15.5)
WBC: 4.7 10*3/uL (ref 4.0–10.5)

## 2018-02-01 LAB — HEPATIC FUNCTION PANEL
ALBUMIN: 4.1 g/dL (ref 3.5–5.0)
ALK PHOS: 73 U/L (ref 38–126)
ALT: 72 U/L — ABNORMAL HIGH (ref 14–54)
AST: 69 U/L — ABNORMAL HIGH (ref 15–41)
BILIRUBIN INDIRECT: 0.6 mg/dL (ref 0.3–0.9)
Bilirubin, Direct: 0.1 mg/dL (ref 0.1–0.5)
Total Bilirubin: 0.7 mg/dL (ref 0.3–1.2)
Total Protein: 7.2 g/dL (ref 6.5–8.1)

## 2018-02-03 NOTE — Progress Notes (Signed)
Hepatic panel result dated 02-01-2018 sent to dr Talbert Nan in epic.

## 2018-02-06 ENCOUNTER — Ambulatory Visit (HOSPITAL_BASED_OUTPATIENT_CLINIC_OR_DEPARTMENT_OTHER): Payer: 59 | Admitting: Anesthesiology

## 2018-02-06 ENCOUNTER — Other Ambulatory Visit: Payer: Self-pay

## 2018-02-06 ENCOUNTER — Ambulatory Visit (HOSPITAL_BASED_OUTPATIENT_CLINIC_OR_DEPARTMENT_OTHER)
Admission: RE | Admit: 2018-02-06 | Discharge: 2018-02-06 | Disposition: A | Discharge status: Home / Self Care | Payer: 59 | Source: Ambulatory Visit | Attending: Obstetrics and Gynecology | Admitting: Obstetrics and Gynecology

## 2018-02-06 ENCOUNTER — Ambulatory Visit (HOSPITAL_COMMUNITY)
Admission: RE | Admit: 2018-02-06 | Discharge: 2018-02-06 | Disposition: A | Discharge status: Home / Self Care | Payer: 59 | Source: Ambulatory Visit | Attending: Obstetrics and Gynecology | Admitting: Obstetrics and Gynecology

## 2018-02-06 ENCOUNTER — Encounter (HOSPITAL_BASED_OUTPATIENT_CLINIC_OR_DEPARTMENT_OTHER): Payer: Self-pay | Admitting: *Deleted

## 2018-02-06 ENCOUNTER — Encounter (HOSPITAL_BASED_OUTPATIENT_CLINIC_OR_DEPARTMENT_OTHER)
Admission: RE | Disposition: A | Discharge status: Home / Self Care | Payer: Self-pay | Source: Ambulatory Visit | Attending: Obstetrics and Gynecology

## 2018-02-06 DIAGNOSIS — Z853 Personal history of malignant neoplasm of breast: Secondary | ICD-10-CM | POA: Diagnosis not present

## 2018-02-06 DIAGNOSIS — Z882 Allergy status to sulfonamides status: Secondary | ICD-10-CM | POA: Diagnosis not present

## 2018-02-06 DIAGNOSIS — Z79899 Other long term (current) drug therapy: Secondary | ICD-10-CM | POA: Insufficient documentation

## 2018-02-06 DIAGNOSIS — N930 Postcoital and contact bleeding: Secondary | ICD-10-CM | POA: Diagnosis not present

## 2018-02-06 DIAGNOSIS — N83201 Unspecified ovarian cyst, right side: Secondary | ICD-10-CM | POA: Insufficient documentation

## 2018-02-06 DIAGNOSIS — Z8249 Family history of ischemic heart disease and other diseases of the circulatory system: Secondary | ICD-10-CM | POA: Diagnosis not present

## 2018-02-06 DIAGNOSIS — Z87891 Personal history of nicotine dependence: Secondary | ICD-10-CM | POA: Diagnosis not present

## 2018-02-06 DIAGNOSIS — N939 Abnormal uterine and vaginal bleeding, unspecified: Secondary | ICD-10-CM | POA: Diagnosis not present

## 2018-02-06 DIAGNOSIS — M8580 Other specified disorders of bone density and structure, unspecified site: Secondary | ICD-10-CM | POA: Diagnosis not present

## 2018-02-06 DIAGNOSIS — R945 Abnormal results of liver function studies: Secondary | ICD-10-CM | POA: Diagnosis not present

## 2018-02-06 DIAGNOSIS — Z79810 Long term (current) use of selective estrogen receptor modulators (SERMs): Secondary | ICD-10-CM | POA: Diagnosis not present

## 2018-02-06 DIAGNOSIS — Z9889 Other specified postprocedural states: Secondary | ICD-10-CM | POA: Diagnosis not present

## 2018-02-06 DIAGNOSIS — E559 Vitamin D deficiency, unspecified: Secondary | ICD-10-CM | POA: Insufficient documentation

## 2018-02-06 DIAGNOSIS — F419 Anxiety disorder, unspecified: Secondary | ICD-10-CM | POA: Insufficient documentation

## 2018-02-06 DIAGNOSIS — N950 Postmenopausal bleeding: Secondary | ICD-10-CM | POA: Diagnosis not present

## 2018-02-06 HISTORY — DX: Fatty (change of) liver, not elsewhere classified: K76.0

## 2018-02-06 HISTORY — DX: Liver disease, unspecified: K76.9

## 2018-02-06 HISTORY — DX: Vitamin D deficiency, unspecified: E55.9

## 2018-02-06 HISTORY — DX: Other specified postprocedural states: Z98.890

## 2018-02-06 HISTORY — PX: DILATATION & CURETTAGE/HYSTEROSCOPY WITH MYOSURE: SHX6511

## 2018-02-06 HISTORY — DX: Abnormal levels of other serum enzymes: R74.8

## 2018-02-06 HISTORY — DX: Presence of other vascular implants and grafts: Z95.828

## 2018-02-06 HISTORY — DX: Other specified disorders of bone density and structure, unspecified site: M85.80

## 2018-02-06 SURGERY — DILATATION & CURETTAGE/HYSTEROSCOPY WITH MYOSURE
Anesthesia: General

## 2018-02-06 MED ORDER — DEXAMETHASONE SODIUM PHOSPHATE 10 MG/ML IJ SOLN
INTRAMUSCULAR | Status: AC
Start: 2018-02-06 — End: ?
  Filled 2018-02-06: qty 1

## 2018-02-06 MED ORDER — MIDAZOLAM HCL 2 MG/2ML IJ SOLN
INTRAMUSCULAR | Status: AC
  Filled 2018-02-06: qty 2

## 2018-02-06 MED ORDER — MIDAZOLAM HCL 5 MG/5ML IJ SOLN
INTRAMUSCULAR | Status: DC | PRN
  Administered 2018-02-06: 2 mg via INTRAVENOUS

## 2018-02-06 MED ORDER — FENTANYL CITRATE (PF) 100 MCG/2ML IJ SOLN
25.0000 ug | INTRAMUSCULAR | Status: DC | PRN
  Filled 2018-02-06: qty 1

## 2018-02-06 MED ORDER — LIDOCAINE 2% (20 MG/ML) 5 ML SYRINGE
INTRAMUSCULAR | Status: AC
  Filled 2018-02-06: qty 5

## 2018-02-06 MED ORDER — FENTANYL CITRATE (PF) 100 MCG/2ML IJ SOLN
INTRAMUSCULAR | Status: DC | PRN
  Administered 2018-02-06 (×2): 25 ug via INTRAVENOUS
  Administered 2018-02-06: 50 ug via INTRAVENOUS

## 2018-02-06 MED ORDER — DEXAMETHASONE SODIUM PHOSPHATE 4 MG/ML IJ SOLN
INTRAMUSCULAR | Status: DC | PRN
  Administered 2018-02-06: 10 mg via INTRAVENOUS

## 2018-02-06 MED ORDER — SCOPOLAMINE 1 MG/3DAYS TD PT72
MEDICATED_PATCH | TRANSDERMAL | Status: AC
  Filled 2018-02-06: qty 1

## 2018-02-06 MED ORDER — ONDANSETRON HCL 4 MG/2ML IJ SOLN
INTRAMUSCULAR | Status: DC | PRN
  Administered 2018-02-06: 4 mg via INTRAVENOUS

## 2018-02-06 MED ORDER — KETOROLAC TROMETHAMINE 30 MG/ML IJ SOLN
INTRAMUSCULAR | Status: AC
  Filled 2018-02-06: qty 1

## 2018-02-06 MED ORDER — LIDOCAINE 2% (20 MG/ML) 5 ML SYRINGE
INTRAMUSCULAR | Status: AC
  Filled 2018-02-06: qty 10

## 2018-02-06 MED ORDER — PROPOFOL 500 MG/50ML IV EMUL
INTRAVENOUS | Status: AC
  Filled 2018-02-06: qty 50

## 2018-02-06 MED ORDER — PROMETHAZINE HCL 25 MG/ML IJ SOLN
6.2500 mg | INTRAMUSCULAR | Status: DC | PRN
  Filled 2018-02-06: qty 1

## 2018-02-06 MED ORDER — KETOROLAC TROMETHAMINE 30 MG/ML IJ SOLN
INTRAMUSCULAR | Status: DC | PRN
  Administered 2018-02-06: 30 mg via INTRAVENOUS

## 2018-02-06 MED ORDER — ARTIFICIAL TEARS OPHTHALMIC OINT
TOPICAL_OINTMENT | OPHTHALMIC | Status: AC
  Filled 2018-02-06: qty 3.5

## 2018-02-06 MED ORDER — SCOPOLAMINE 1 MG/3DAYS TD PT72
1.0000 | MEDICATED_PATCH | TRANSDERMAL | Status: DC
  Administered 2018-02-06: 1.5 mg via TRANSDERMAL
  Filled 2018-02-06: qty 1

## 2018-02-06 MED ORDER — LACTATED RINGERS IV SOLN
INTRAVENOUS | Status: DC
  Administered 2018-02-06 (×2): via INTRAVENOUS
  Filled 2018-02-06: qty 1000

## 2018-02-06 MED ORDER — ONDANSETRON HCL 4 MG/2ML IJ SOLN
INTRAMUSCULAR | Status: AC
  Filled 2018-02-06: qty 2

## 2018-02-06 MED ORDER — PROPOFOL 10 MG/ML IV BOLUS
INTRAVENOUS | Status: DC | PRN
  Administered 2018-02-06: 200 mg via INTRAVENOUS
  Administered 2018-02-06 (×2): 50 mg via INTRAVENOUS

## 2018-02-06 MED ORDER — LIDOCAINE HCL (CARDIAC) PF 100 MG/5ML IV SOSY
PREFILLED_SYRINGE | INTRAVENOUS | Status: DC | PRN
  Administered 2018-02-06: 100 mg via INTRAVENOUS

## 2018-02-06 SURGICAL SUPPLY — 24 items
CANISTER SUCT 3000ML PPV (MISCELLANEOUS) ×4 IMPLANT
CATH FOLEY 2WAY SLVR  5CC 16FR (CATHETERS) ×1
CATH FOLEY 2WAY SLVR 5CC 16FR (CATHETERS) ×1 IMPLANT
CATH ROBINSON RED A/P 16FR (CATHETERS) IMPLANT
COUNTER NEEDLE 1200 MAGNETIC (NEEDLE) ×2 IMPLANT
DEVICE MYOSURE LITE (MISCELLANEOUS) IMPLANT
DEVICE MYOSURE REACH (MISCELLANEOUS) IMPLANT
DILATOR CANAL MILEX (MISCELLANEOUS) IMPLANT
FILTER ARTHROSCOPY CONVERTOR (FILTER) ×2 IMPLANT
GLOVE BIO SURGEON STRL SZ 6.5 (GLOVE) ×2 IMPLANT
GOWN STRL REUS W/TWL LRG LVL3 (GOWN DISPOSABLE) ×2 IMPLANT
IV NS IRRIG 3000ML ARTHROMATIC (IV SOLUTION) ×2 IMPLANT
KIT TURNOVER CYSTO (KITS) ×2 IMPLANT
MYOSURE XL FIBROID REM (MISCELLANEOUS)
PACK VAGINAL MINOR WOMEN LF (CUSTOM PROCEDURE TRAY) ×2 IMPLANT
PAD OB MATERNITY 4.3X12.25 (PERSONAL CARE ITEMS) ×2 IMPLANT
PAD PREP 24X48 CUFFED NSTRL (MISCELLANEOUS) ×2 IMPLANT
SEAL ROD LENS SCOPE MYOSURE (ABLATOR) ×2 IMPLANT
SYR 20CC LL (SYRINGE) IMPLANT
SYSTEM TISS REMOVAL MYSR XL RM (MISCELLANEOUS) IMPLANT
TOWEL OR 17X24 6PK STRL BLUE (TOWEL DISPOSABLE) ×4 IMPLANT
TUBING AQUILEX INFLOW (TUBING) ×2 IMPLANT
TUBING AQUILEX OUTFLOW (TUBING) ×2 IMPLANT
WATER STERILE IRR 500ML POUR (IV SOLUTION) IMPLANT

## 2018-02-06 NOTE — Interval H&P Note (Signed)
History and Physical Interval Note:  02/06/2018 7:46 AM  Lisa Anthony  has presented today for surgery, with the diagnosis of PMB, hx endometrial ablation, hx breast cancer on tamoxifen  The various methods of treatment have been discussed with the patient and family. After consideration of risks, benefits and other options for treatment, the patient has consented to  Procedure(s) with comments: [place] *with ultrasound guidance* (N/A) - need ultrasound guidance due to hx of ablation as a surgical intervention .  The patient's history has been reviewed, patient examined, no change in status, stable for surgery.  I have reviewed the patient's chart and labs.  Questions were answered to the patient's satisfaction.     Salvadore Dom

## 2018-02-06 NOTE — Discharge Instructions (Signed)
°  Post Anesthesia Home Care Instructions  Activity: Get plenty of rest for the remainder of the day. A responsible individual must stay with you for 24 hours following the procedure.  For the next 24 hours, DO NOT: -Drive a car -Paediatric nurse -Drink alcoholic beverages -Take any medication unless instructed by your physician -Make any legal decisions or sign important papers.  Meals: Start with liquid foods such as gelatin or soup. Progress to regular foods as tolerated. Avoid greasy, spicy, heavy foods. If nausea and/or vomiting occur, drink only clear liquids until the nausea and/or vomiting subsides. Call your physician if vomiting continues.  Special Instructions/Symptoms: Your throat may feel dry or sore from the anesthesia or the breathing tube placed in your throat during surgery. If this causes discomfort, gargle with warm salt water. The discomfort should disappear within 24 hours.  If you had a scopolamine patch placed behind your ear for the management of post- operative nausea and/or vomiting:  1. The medication in the patch is effective for 72 hours, after which it should be removed.  Wrap patch in a tissue and discard in the trash. Wash hands thoroughly with soap and water. 2. You may remove the patch earlier than 72 hours if you experience unpleasant side effects which may include dry mouth, dizziness or visual disturbances. 3. Avoid touching the patch. Wash your hands with soap and water after contact with the patch.       D & C Home care Instructions:   Personal hygiene:  Used sanitary napkins for vaginal drainage not tampons. Shower or tub bathe the day after your procedure. No douching until bleeding stops. Always wipe from front to back after  Elimination.  Activity: Do not drive or operate any equipment today. The effects of the anesthesia are still present and drowsiness may result. Rest today, not necessarily flat bed rest, just take it easy. You may resume  your normal activity in one to 2 days.  Sexual activity: No intercourse for one week or as indicated by your physician  Diet: Eat a light diet as desired this evening. You may resume a regular diet tomorrow.  Return to work: One to 2 days.  General Expectations of your surgery: Vaginal bleeding should be no heavier than a normal period. Spotting may continue up to 10 days. Mild cramps may continue for a couple of days. You may have a regular period in 2-6 weeks.  Unexpected observations call your doctor if these occur: persistent or heavy bleeding. Severe abdominal cramping or pain. Elevation of temperature greater than 100F.  Call for an appointment in one week.    No NSAID's (ibuprofen, aspirin, naproxen) until 4:30pm today.

## 2018-02-06 NOTE — Anesthesia Preprocedure Evaluation (Addendum)
Anesthesia Evaluation  Patient identified by MRN, date of birth, ID band Patient awake    Reviewed: Allergy & Precautions, H&P , NPO status , Patient's Chart, lab work & pertinent test results  History of Anesthesia Complications Negative for: history of anesthetic complications  Airway Mallampati: II  TM Distance: >3 FB Neck ROM: full    Dental  (+) Teeth Intact, Dental Advisory Given   Pulmonary former smoker,    breath sounds clear to auscultation       Cardiovascular negative cardio ROS   Rhythm:regular Rate:Normal     Neuro/Psych PSYCHIATRIC DISORDERS Anxiety negative neurological ROS     GI/Hepatic negative GI ROS, Neg liver ROS,   Endo/Other  negative endocrine ROS  Renal/GU negative Renal ROS     Musculoskeletal   Abdominal   Peds  Hematology negative hematology ROS (+)   Anesthesia Other Findings   Reproductive/Obstetrics Breast CA                            Anesthesia Physical  Anesthesia Plan  ASA: II  Anesthesia Plan: General   Post-op Pain Management:    Induction: Intravenous  PONV Risk Score and Plan: 3 and Ondansetron, Dexamethasone and Scopolamine patch - Pre-op  Airway Management Planned: LMA  Additional Equipment: None  Intra-op Plan:   Post-operative Plan: Extubation in OR  Informed Consent: I have reviewed the patients History and Physical, chart, labs and discussed the procedure including the risks, benefits and alternatives for the proposed anesthesia with the patient or authorized representative who has indicated his/her understanding and acceptance.   Dental advisory given  Plan Discussed with:   Anesthesia Plan Comments:         Anesthesia Quick Evaluation

## 2018-02-06 NOTE — Op Note (Addendum)
Preoperative Diagnosis: postmenopausal, postcoital bleeding, history of breast cancer, on tamoxifen, history of endometrial ablation  Postoperative Diagnosis: Same, scarred uterine cavity  Procedure: Hysteroscopy, dilation and curettage under ultrasound guidance  Surgeon: Dr Sumner Boast  Assistants: None  Anesthesia: General via LMA  EBL: 5 cc  Fluids: 400 cc LR  Fluid deficit: 75 cc  Urine output: not recorded  Indications for surgery: The patient is a 52 yo female, who presented with complaints of postcoital bleeding. She has a history of an endometrial ablation, a history of breast cancer and is on tamoxifen. Work up included a normal pap smear, and an ultrasound that showed a slightly thickened endometrial stripe. I was unable to get into her uterine cavity in the office, even with use of the mini-dilators. The risks of the surgery were reviewed with the patient and the consent form was signed prior to her surgery.  Findings: EUA, normal sized uterus, no adnexal masses. Uterine cavity is scarred, unable to identify tubal ostia. In the area of the endometrial stripe on ultrasound, the entire time.   Specimens: Endometrial curettings   Procedure: The patient was taken to the operating room with an IV in place. She was placed in the dorsal lithotomy position and anesthesia was administered. She was prepped and draped in the usual sterile fashion for a vaginal procedure. Her bladder was empty, a foley was placed and the bladder was back filled with 300 cc of sterile water. A speculum was placed in the vagina and a single tooth tenaculum was placed on the anterior lip of the cervix. The cervix was dilated with the mini-dilators to the #7 hagar dilators, all with ultrasound guidance. The myosure hysteroscope was inserted into the uterine cavity, while watching with the ultrasound. With continuous infusion of normal saline, the uterine cavity was visualized with the above findings. There  were no identifiable landmarks, just a scarred cavity. The myosure was then removed. The cavity was then curetted with the small sharp curette under ultrasound guidance. The cavity had the characteristically gritty texture at the end of the procedure. The curette and the single tooth tenaculum were removed. The speculum was removed. Her bladder was drained and the foley was removed. The patients perineum was cleansed of betadine and she was taken out of the dorsal lithotomy position.  Upon awakening the LMA was removed and the patient was transferred to the recovery room in stable and awake condition.  The sponge and instrument count were correct. There were no complications.    CC: Dr Lindi Adie

## 2018-02-06 NOTE — Transfer of Care (Signed)
Immediate Anesthesia Transfer of Care Note  Patient: Lisa Anthony  Procedure(s) Performed: DILATATION & CURETTAGE/HYSTEROSCOPY WITH MYOSURE *with ultrasound guidance* (N/A )  Patient Location: PACU  Anesthesia Type:General  Level of Consciousness: awake, alert , oriented and patient cooperative  Airway & Oxygen Therapy: Patient Spontanous Breathing and Patient connected to nasal cannula oxygen  Post-op Assessment: Report given to RN and Post -op Vital signs reviewed and stable  Post vital signs: Reviewed and stable  Last Vitals:  Vitals Value Taken Time  BP    Temp    Pulse 86 02/06/2018 10:42 AM  Resp 11 02/06/2018 10:42 AM  SpO2 98 % 02/06/2018 10:42 AM  Vitals shown include unvalidated device data.  Last Pain:  Vitals:   02/06/18 0708  TempSrc:   PainSc: 0-No pain      Patients Stated Pain Goal: 7 ([id number])  Complications: No apparent anesthesia complications

## 2018-02-06 NOTE — Anesthesia Procedure Notes (Signed)
Procedure Name: LMA Insertion Date/Time: 02/06/2018 10:04 AM Performed by: Wanita Chamberlain, CRNA Pre-anesthesia Checklist: Patient identified, Timeout performed, Emergency Drugs available, Suction available and Patient being monitored Patient Re-evaluated:Patient Re-evaluated prior to induction Oxygen Delivery Method: Circle system utilized Preoxygenation: Pre-oxygenation with 100% oxygen Induction Type: IV induction Ventilation: Mask ventilation without difficulty LMA: LMA inserted LMA Size: 4.0 Number of attempts: 1 Placement Confirmation: CO2 detector,  positive ETCO2 and breath sounds checked- equal and bilateral Tube secured with: Tape Dental Injury: Teeth and Oropharynx as per pre-operative assessment

## 2018-02-07 ENCOUNTER — Encounter (HOSPITAL_BASED_OUTPATIENT_CLINIC_OR_DEPARTMENT_OTHER): Payer: Self-pay | Admitting: Obstetrics and Gynecology

## 2018-02-13 NOTE — Anesthesia Postprocedure Evaluation (Signed)
Anesthesia Post Note  Patient: EMOREE SASAKI  Procedure(s) Performed: DILATATION & CURETTAGE/HYSTEROSCOPY *with ultrasound guidance* (N/A )     Patient location during evaluation: PACU Anesthesia Type: General Level of consciousness: sedated Pain management: pain level controlled Vital Signs Assessment: post-procedure vital signs reviewed and stable Respiratory status: spontaneous breathing and respiratory function stable Cardiovascular status: stable Postop Assessment: no apparent nausea or vomiting Anesthetic complications: no    Last Vitals:  Vitals:   02/06/18 1115 02/06/18 1145  BP: (!) 133/91 129/88  Pulse:    Resp:  16  Temp:  36.7 C  SpO2:  98%    Last Pain:  Vitals:   02/06/18 1145  TempSrc: Oral  PainSc: 1                  Cacie Gaskins DANIEL

## 2018-02-20 ENCOUNTER — Inpatient Hospital Stay: Payer: 59 | Attending: Hematology and Oncology | Admitting: Hematology and Oncology

## 2018-02-20 DIAGNOSIS — Z79810 Long term (current) use of selective estrogen receptor modulators (SERMs): Secondary | ICD-10-CM | POA: Insufficient documentation

## 2018-02-20 DIAGNOSIS — C50211 Malignant neoplasm of upper-inner quadrant of right female breast: Secondary | ICD-10-CM | POA: Insufficient documentation

## 2018-02-20 DIAGNOSIS — Z9221 Personal history of antineoplastic chemotherapy: Secondary | ICD-10-CM | POA: Insufficient documentation

## 2018-02-20 DIAGNOSIS — Z923 Personal history of irradiation: Secondary | ICD-10-CM | POA: Insufficient documentation

## 2018-02-20 DIAGNOSIS — Z170 Estrogen receptor positive status [ER+]: Secondary | ICD-10-CM | POA: Insufficient documentation

## 2018-02-20 DIAGNOSIS — Z79899 Other long term (current) drug therapy: Secondary | ICD-10-CM | POA: Insufficient documentation

## 2018-02-20 NOTE — Progress Notes (Deleted)
Patient Care Team: Patient, No Pcp Per as PCP - General (General Practice) Lovell Sheehan, NP as Nurse Practitioner (Nurse Practitioner) Jovita Kussmaul, MD as Consulting Physician (General Surgery) Nicholas Lose, MD as Consulting Physician (Hematology and Oncology) Kyung Rudd, MD as Consulting Physician (Radiation Oncology) Delice Bison Charlestine Massed, NP as Nurse Practitioner (Hematology and Oncology)  DIAGNOSIS:  Encounter Diagnosis  Name Primary?  . Malignant neoplasm of upper-inner quadrant of right breast in female, estrogen receptor positive ([place])     SUMMARY OF ONCOLOGIC HISTORY:   Malignant neoplasm of upper-inner quadrant of right breast in female, estrogen receptor positive ([place])   10/20/2016 Initial Diagnosis    Right breast biopsy 12:30 position: IDC grade 3, ER 100%, PR 95%, Ki-67 30%, HER-2 negative ratio 1.31, screening detected right breast asymmetry and calcifications 1.1 cm, right axillary borderline enlarged lymph node biopsy benign, T1c N0 stage IA clinical stage      11/17/2016 Genetic Testing    Testing was normal and did not reveal a mutation.  Genes tested include: APC, ATM, AXIN2, BARD1, BMPR1A, BRCA1, BRCA2, BRIP1, CDH1, CDKN2A, CHEK2, DICER1, EPCAM, GREM1, HOXB13, KIT, MEN1, MLH1, MSH2, MSH6, MUTYH, NBN, NF1, PALB2, PDGFRA, PMS2, POLD1, POLE, PTEN, RAD50, RAD51C, RAD51D, SDHA, SDHB, SDHC, SDHD, SMAD4, SMARCA4, STK11, TP53, TSC1, TSC2, VHL          11/18/2016 Surgery    Right lumpectomy: IDC with DCIS, 1 cm, margins negative, 0/4 lymph nodes, ER 100%, PR 95%, Ki-67 30%, HER-2 negative ratio 1.31, T1 BN 0 stage IA       11/25/2016 Oncotype testing    Oncotype DX score 27: 18% risk of recurrence with tamoxifen alone intermediate risk      12/21/2016 - 02/22/2017 Adjuvant Chemotherapy    Taxotere and Cytoxan x 4 cycles      04/04/2017 - 05/18/2017 Radiation Therapy    Adjuvant radiation therapy      05/18/2017 -  Anti-estrogen oral therapy    Tamoxifen 20 mg  daily       Genetic testing   11/16/2016 Initial Diagnosis    Genetic testing was negative for mutations in the 43 genes on [employer]'s Common Cancers panel (APC, ATM, AXIN2, BARD1, BMPR1A, BRCA1, BRCA2, BRIP1, CDH1, CDKN2A, CHEK2, DICER1, EPCAM, GREM1, HOXB13, KIT, MEN1, MLH1, MSH2, MSH6, MUTYH, NBN, NF1, PALB2, PDGFRA, PMS2, POLD1, POLE, PTEN, RAD50, RAD51C, RAD51D, SDHA, SDHB, SDHC, SDHD, SMAD4, SMARCA4, STK11, TP53, TSC1, TSC2, VHL).       CHIEF COMPLIANT: Follow-up on tamoxifen therapy  INTERVAL HISTORY: Lisa Anthony is a 52 year old with above-mentioned history of right breast cancer currently on tamoxifen appears to be tolerating extremely well.  She had extensive work-up because of a CT of the abdomen showing acute renal calculus as well as a liver lesion.  MRI of the liver did not reveal any abnormalities it was a hemangioma.  She was referred to urology regarding the kidney stone.  She is currently on surveillance and does not appear to have any new complaints.  REVIEW OF SYSTEMS:   Constitutional: Denies fevers, chills or abnormal weight loss Eyes: Denies blurriness of vision Ears, nose, mouth, throat, and face: Denies mucositis or sore throat Respiratory: Denies cough, dyspnea or wheezes Cardiovascular: Denies palpitation, chest discomfort Gastrointestinal:  Denies nausea, heartburn or change in bowel habits Skin: Denies abnormal skin rashes Lymphatics: Denies new lymphadenopathy or easy bruising Neurological:Denies numbness, tingling or new weaknesses Behavioral/Psych: Mood is stable, no new changes  Extremities: No lower extremity edema Breast:  denies any  pain or lumps or nodules in either breasts All other systems were reviewed with the patient and are negative.  I have reviewed the past medical history, past surgical history, social history and family history with the patient and they are unchanged from previous note.  ALLERGIES:  is allergic to sulfa  antibiotics.  MEDICATIONS:  Current Outpatient Medications  Medication Sig Dispense Refill  . Minocycline HCl 90 MG TB24 Take 1 tablet by mouth as needed.  6  . tamoxifen (NOLVADEX) 20 MG tablet Take 1 tablet (20 mg total) by mouth daily. 90 tablet 3  . venlafaxine XR (EFFEXOR-XR) 75 MG 24 hr capsule Take 1 capsule (75 mg total) by mouth daily with breakfast. 90 capsule 3   No current facility-administered medications for this visit.     PHYSICAL EXAMINATION: ECOG PERFORMANCE STATUS: 1 - Symptomatic but completely ambulatory  There were no vitals filed for this visit. There were no vitals filed for this visit.  GENERAL:alert, no distress and comfortable SKIN: skin color, texture, turgor are normal, no rashes or significant lesions EYES: normal, Conjunctiva are pink and non-injected, sclera clear OROPHARYNX:no exudate, no erythema and lips, buccal mucosa, and tongue normal  NECK: supple, thyroid normal size, non-tender, without nodularity LYMPH:  no palpable lymphadenopathy in the cervical, axillary or inguinal LUNGS: clear to auscultation and percussion with normal breathing effort HEART: regular rate & rhythm and no murmurs and no lower extremity edema ABDOMEN:abdomen soft, non-tender and normal bowel sounds MUSCULOSKELETAL:no cyanosis of digits and no clubbing  NEURO: alert & oriented x 3 with fluent speech, no focal motor/sensory deficits EXTREMITIES: No lower extremity edema  LABORATORY DATA:  I have reviewed the data as listed CMP Latest Ref Rng & Units 02/01/2018 12/26/2017 02/22/2017  Glucose 65 - 99 mg/dL - 92 90  BUN 6 - 24 mg/dL - 13 15.0  Creatinine 0.57 - 1.00 mg/dL - 0.78 0.8  Sodium 134 - 144 mmol/L - 141 140  Potassium 3.5 - 5.2 mmol/L - 4.7 3.8  Chloride 96 - 106 mmol/L - 102 -  CO2 20 - 29 mmol/L - 24 27  Calcium 8.7 - 10.2 mg/dL - 9.2 9.8  Total Protein 6.5 - 8.1 g/dL 7.2 5.9(L) 6.6  Total Bilirubin 0.3 - 1.2 mg/dL 0.7 0.5 0.44  Alkaline Phos 38 - 126 U/L 73  72 81  AST 15 - 41 U/L 69(H) 79(H) 25  ALT 14 - 54 U/L 72(H) 75(H) 31    Lab Results  Component Value Date   WBC 4.7 02/01/2018   HGB 13.6 02/01/2018   HCT 40.9 02/01/2018   MCV 97.1 02/01/2018   PLT 228 02/01/2018   NEUTROABS 5.5 02/22/2017    ASSESSMENT & PLAN:  Malignant neoplasm of upper-inner quadrant of right breast in female, estrogen receptor positive (HCC) Right lumpectomy: IDC with DCIS, 1 cm, margins negative, 0/4 lymph nodes, ER 100%, PR 95%, Ki-67 30%, HER-2 negative ratio 1.31, T1 BN 0 stage IA Oncotype DX score 27: 18% risk of recurrence with tamoxifen alone intermediate risk.  Treatment summary: 1. adjuvant chemotherapy with Taxotere and Cytoxan every 3 weeks 4 started 12/21/2016 Completed 02/22/2017  2. radiation therapy 04/04/2017 - 05/18/2017 3. Adjuvant antiestrogen therapy with tamoxifen 20 mg daily started 05/18/2017 (last menstrual cycle was April 2018)  Tamoxifen toxicities:   CT abdomen 01/13/2018: Nonobstructing 11 mm right renal calculus, hypervascular lesion dome of the right lobe of liver 2.1 cm MRI liver 01/23/2018: Benign hemangioma, fatty liver  Return to clinic in 1  year for follow-up.  Mammograms to be done in January 2020    No orders of the defined types were placed in this encounter.  The patient has a good understanding of the overall plan. she agrees with it. she will call with any problems that may develop before the next visit here.   Harriette Ohara, MD 02/20/18

## 2018-02-20 NOTE — Assessment & Plan Note (Deleted)
Right lumpectomy: IDC with DCIS, 1 cm, margins negative, 0/4 lymph nodes, ER 100%, PR 95%, Ki-67 30%, HER-2 negative ratio 1.31, T1 BN 0 stage IA Oncotype DX score 27: 18% risk of recurrence with tamoxifen alone intermediate risk.  Treatment summary: 1. adjuvant chemotherapy with Taxotere and Cytoxan every 3 weeks 4 started 12/21/2016 Completed 02/22/2017  2. radiation therapy 04/04/2017 - 05/18/2017 3. Adjuvant antiestrogen therapy with tamoxifen 20 mg daily started 05/18/2017 (last menstrual cycle was April 2018)  Tamoxifen toxicities:   CT abdomen 01/13/2018: Nonobstructing 11 mm right renal calculus, hypervascular lesion dome of the right lobe of liver 2.1 cm MRI liver 01/23/2018: Benign hemangioma, fatty liver  Return to clinic in 1 year for follow-up.  Mammograms to be done in January 2020

## 2018-02-21 ENCOUNTER — Other Ambulatory Visit: Payer: Self-pay

## 2018-02-21 ENCOUNTER — Encounter: Payer: Self-pay | Admitting: Obstetrics and Gynecology

## 2018-02-21 ENCOUNTER — Ambulatory Visit: Payer: 59 | Admitting: Obstetrics and Gynecology

## 2018-02-21 ENCOUNTER — Telehealth: Payer: Self-pay

## 2018-02-21 ENCOUNTER — Encounter: Payer: Self-pay | Admitting: Hematology and Oncology

## 2018-02-21 VITALS — BP 130/82 | HR 84 | Resp 14 | Wt 158.0 lb

## 2018-02-21 DIAGNOSIS — Z9889 Other specified postprocedural states: Secondary | ICD-10-CM

## 2018-02-21 DIAGNOSIS — N930 Postcoital and contact bleeding: Secondary | ICD-10-CM | POA: Diagnosis not present

## 2018-02-21 NOTE — Progress Notes (Signed)
GYNECOLOGY  VISIT   HPI: 52 y.o.   Divorced  Caucasian  female   [phone] with No LMP recorded. (Menstrual status: Chemotherapy).   here for follow up. Patient is 15 days s/p D&C hysteroscopy under ultrasound guidance secondary to postcoital bleeding, a h/o endometrial ablation and an inability to do an office evaluation. She had a scarred uterine cavity, I was in the area of the endometrial stripe on ultrasound.    Benign endometrial stromal and endocervical cells were seen.    She spotted for a few days after surgery, no pain.   GYNECOLOGIC HISTORY: No LMP recorded. (Menstrual status: Chemotherapy). Contraception: Essure  Menopausal hormone therapy: none         OB History    Gravida  3   Para  3   Term  3   Preterm      AB      Living  3     SAB      TAB      Ectopic      Multiple      Live Births  3              Patient Active Problem List   Diagnosis Date Noted  . Osteopenia 12/21/2017  . Vitamin D deficiency 12/21/2017  . Port catheter in place 12/21/2016  . Genetic testing 11/17/2016  . Malignant neoplasm of upper-inner quadrant of right breast in female, estrogen receptor positive ([place]) 10/27/2016    Past Medical History:  Diagnosis Date  . Amenorrhea   . Anxiety   . Breast cancer ([place])    right  . Elevated liver enzymes   . Fatty liver   . History of kidney stones    yrs ago   . History of removal of Port-a-Cath 02/2017  . Lesion of right lobe of liver   . Osteopenia   . Port-A-Cath in place   . Vitamin D deficiency     Past Surgical History:  Procedure Laterality Date  . BREAST LUMPECTOMY Right 11/18/2016   chemo and radiation  . BREAST LUMPECTOMY WITH RADIOACTIVE SEED AND SENTINEL LYMPH NODE BIOPSY Right 11/18/2016   Procedure: BREAST LUMPECTOMY WITH RADIOACTIVE SEED AND SENTINEL LYMPH NODE BIOPSY;  Surgeon: Autumn Messing III, MD;  Location: [place];  Service: General;  Laterality: Right;  . DILATATION &  CURETTAGE/HYSTEROSCOPY WITH MYOSURE N/A 02/06/2018   Procedure: DILATATION & CURETTAGE/HYSTEROSCOPY *with ultrasound guidance*;  Surgeon: Salvadore Dom, MD;  Location[facility];  Service: Gynecology;  Laterality: N/A;  need ultrasound guidance due to hx of ablation  . ENDOMETRIAL ABLATION    . HERNIA REPAIR     at birth  . PORT-A-CATH REMOVAL Left 03/14/2017   Procedure: REMOVAL PORT-A-CATH;  Surgeon: Jovita Kussmaul, MD;  Location: [place];  Service: General;  Laterality: Left;  . PORTACATH PLACEMENT N/A 12/13/2016   Procedure: INSERTION PORT-A-CATH;  Surgeon: Autumn Messing III, MD;  Location: [place];  Service: General;  Laterality: N/A;    Current Outpatient Medications  Medication Sig Dispense Refill  . Minocycline HCl 90 MG TB24 Take 1 tablet by mouth as needed.  6  . tamoxifen (NOLVADEX) 20 MG tablet Take 1 tablet (20 mg total) by mouth daily. 90 tablet 3  . venlafaxine XR (EFFEXOR-XR) 75 MG 24 hr capsule Take 1 capsule (75 mg total) by mouth daily with breakfast. 90 capsule 3   No current facility-administered medications for this visit.  ALLERGIES: Sulfa antibiotics  Family History  Problem Relation Age of Onset  . Breast cancer Maternal Grandmother 73       bilateral  . Glaucoma Mother   . Heart attack Father 69  . Hyperlipidemia Father   . Breast cancer Cousin 94  . Hypertension Sister     Social History   Socioeconomic History  . Marital status: Divorced    Spouse name: Not on file  . Number of children: Not on file  . Years of education: Not on file  . Highest education level: Not on file  Occupational History  . Not on file  Social Needs  . Financial resource strain: Not on file  . Food insecurity:    Worry: Not on file    Inability: Not on file  . Transportation needs:    Medical: Not on file    Non-medical: Not on file  Tobacco Use  . Smoking status: Former Smoker    Types: Cigarettes    Last attempt to quit:  03/26/1998    Years since quitting: 19.9  . Smokeless tobacco: Never Used  Substance and Sexual Activity  . Alcohol use: Yes    Alcohol/week: 1.8 - 2.4 oz    Types: 3 - 4 Standard drinks or equivalent per week    Comment: 3-4 on weekends  . Drug use: No  . Sexual activity: Not Currently    Partners: Male    Birth control/protection: Other-see comments    Comment: ESSURE  Lifestyle  . Physical activity:    Days per week: Not on file    Minutes per session: Not on file  . Stress: Not on file  Relationships  . Social connections:    Talks on phone: Not on file    Gets together: Not on file    Attends religious service: Not on file    Active member of club or organization: Not on file    Attends meetings of clubs or organizations: Not on file    Relationship status: Not on file  . Intimate partner violence:    Fear of current or ex partner: Not on file    Emotionally abused: Not on file    Physically abused: Not on file    Forced sexual activity: Not on file  Other Topics Concern  . Not on file  Social History Narrative  . Not on file    Review of Systems  Constitutional: Negative.   HENT: Negative.   Eyes: Negative.   Respiratory: Negative.   Cardiovascular: Negative.   Gastrointestinal: Negative.   Genitourinary: Negative.   Musculoskeletal: Negative.   Skin: Negative.   Neurological: Negative.   Endo/Heme/Allergies: Negative.   Psychiatric/Behavioral: Negative.     PHYSICAL EXAMINATION:    BP 130/82 (BP Location: Right Arm, Patient Position: Sitting, Cuff Size: Normal)   Pulse 84   Resp 14   Wt 158 lb (71.7 kg)   BMI 25.50 kg/m     General appearance: alert, cooperative and appears stated age Abdomen: soft, non-tender; non distended, no masses,  no organomegaly   ASSESSMENT Episode of postcoital spotting, slightly thickened endometrium, h/o ablation. Scarred cavity noted at hysteroscopy, benign pathology.    PLAN No further treatment unless she has  continued bleeding.    An After Visit Summary was printed and given to the patient.

## 2018-02-21 NOTE — Telephone Encounter (Signed)
Called pt and rescheduled previous canceled appt.  Cyndia Bent RN

## 2018-02-23 ENCOUNTER — Inpatient Hospital Stay: Payer: 59

## 2018-02-23 ENCOUNTER — Inpatient Hospital Stay (HOSPITAL_BASED_OUTPATIENT_CLINIC_OR_DEPARTMENT_OTHER): Payer: 59 | Admitting: Hematology and Oncology

## 2018-02-23 ENCOUNTER — Telehealth: Payer: Self-pay | Admitting: Hematology and Oncology

## 2018-02-23 DIAGNOSIS — Z923 Personal history of irradiation: Secondary | ICD-10-CM | POA: Diagnosis not present

## 2018-02-23 DIAGNOSIS — Z79899 Other long term (current) drug therapy: Secondary | ICD-10-CM

## 2018-02-23 DIAGNOSIS — Z79810 Long term (current) use of selective estrogen receptor modulators (SERMs): Secondary | ICD-10-CM

## 2018-02-23 DIAGNOSIS — C50211 Malignant neoplasm of upper-inner quadrant of right female breast: Secondary | ICD-10-CM

## 2018-02-23 DIAGNOSIS — Z170 Estrogen receptor positive status [ER+]: Secondary | ICD-10-CM | POA: Diagnosis not present

## 2018-02-23 DIAGNOSIS — Z9221 Personal history of antineoplastic chemotherapy: Secondary | ICD-10-CM

## 2018-02-23 NOTE — Assessment & Plan Note (Signed)
Right lumpectomy: IDC with DCIS, 1 cm, margins negative, 0/4 lymph nodes, ER 100%, PR 95%, Ki-67 30%, HER-2 negative ratio 1.31, T1 BN 0 stage IA Oncotype DX score 27: 18% risk of recurrence with tamoxifen alone intermediate risk.  Treatment summary: 1. adjuvant chemotherapy with Taxotere and Cytoxan every 3 weeks 4 started 12/21/2016 Completed 02/22/2017  2. radiation therapy 04/04/2017 - 05/18/2017 3. Adjuvant antiestrogen therapy with tamoxifen 20 mg daily started 05/18/2017 (last menstrual cycle was April 2018)  Tamoxifen toxicities:  Breast Cancer Surveillance: 1. Breast exam 02/23/2018: Benign 2. Mammogram will need to be done January 2020  Return to clinic in 1 year for follow-up

## 2018-02-23 NOTE — Progress Notes (Signed)
Patient Care Team: Patient, No Pcp Per as PCP - General (General Practice) Lovell Sheehan, NP as Nurse Practitioner (Nurse Practitioner) Jovita Kussmaul, MD as Consulting Physician (General Surgery) Nicholas Lose, MD as Consulting Physician (Hematology and Oncology) Kyung Rudd, MD as Consulting Physician (Radiation Oncology) Delice Bison Charlestine Massed, NP as Nurse Practitioner (Hematology and Oncology)  DIAGNOSIS:  Encounter Diagnosis  Name Primary?  . Malignant neoplasm of upper-inner quadrant of right breast in female, estrogen receptor positive ([place])     SUMMARY OF ONCOLOGIC HISTORY:   Malignant neoplasm of upper-inner quadrant of right breast in female, estrogen receptor positive ([place])   10/20/2016 Initial Diagnosis    Right breast biopsy 12:30 position: IDC grade 3, ER 100%, PR 95%, Ki-67 30%, HER-2 negative ratio 1.31, screening detected right breast asymmetry and calcifications 1.1 cm, right axillary borderline enlarged lymph node biopsy benign, T1c N0 stage IA clinical stage      11/17/2016 Genetic Testing    Testing was normal and did not reveal a mutation.  Genes tested include: APC, ATM, AXIN2, BARD1, BMPR1A, BRCA1, BRCA2, BRIP1, CDH1, CDKN2A, CHEK2, DICER1, EPCAM, GREM1, HOXB13, KIT, MEN1, MLH1, MSH2, MSH6, MUTYH, NBN, NF1, PALB2, PDGFRA, PMS2, POLD1, POLE, PTEN, RAD50, RAD51C, RAD51D, SDHA, SDHB, SDHC, SDHD, SMAD4, SMARCA4, STK11, TP53, TSC1, TSC2, VHL          11/18/2016 Surgery    Right lumpectomy: IDC with DCIS, 1 cm, margins negative, 0/4 lymph nodes, ER 100%, PR 95%, Ki-67 30%, HER-2 negative ratio 1.31, T1 BN 0 stage IA       11/25/2016 Oncotype testing    Oncotype DX score 27: 18% risk of recurrence with tamoxifen alone intermediate risk      12/21/2016 - 02/22/2017 Adjuvant Chemotherapy    Taxotere and Cytoxan x 4 cycles      04/04/2017 - 05/18/2017 Radiation Therapy    Adjuvant radiation therapy      05/18/2017 -  Anti-estrogen oral therapy    Tamoxifen 20 mg  daily       Genetic testing   11/16/2016 Initial Diagnosis    Genetic testing was negative for mutations in the 43 genes on [employer]'s Common Cancers panel (APC, ATM, AXIN2, BARD1, BMPR1A, BRCA1, BRCA2, BRIP1, CDH1, CDKN2A, CHEK2, DICER1, EPCAM, GREM1, HOXB13, KIT, MEN1, MLH1, MSH2, MSH6, MUTYH, NBN, NF1, PALB2, PDGFRA, PMS2, POLD1, POLE, PTEN, RAD50, RAD51C, RAD51D, SDHA, SDHB, SDHC, SDHD, SMAD4, SMARCA4, STK11, TP53, TSC1, TSC2, VHL).       CHIEF COMPLIANT: Follow-up on tamoxifen therapy  INTERVAL HISTORY: Lisa Anthony is a 52 year old with above-mentioned history of right breast cancer treated with lumpectomy and adjuvant chemotherapy and radiation is currently on tamoxifen.  She appears to be tolerating tamoxifen extremely well.  She denies any hot flashes or myalgias.  Denies any lumps or nodules in the breast.  REVIEW OF SYSTEMS:   Constitutional: Denies fevers, chills or abnormal weight loss Eyes: Denies blurriness of vision Ears, nose, mouth, throat, and face: Denies mucositis or sore throat Respiratory: Denies cough, dyspnea or wheezes Cardiovascular: Denies palpitation, chest discomfort Gastrointestinal:  Denies nausea, heartburn or change in bowel habits Skin: Denies abnormal skin rashes Lymphatics: Denies new lymphadenopathy or easy bruising Neurological:Denies numbness, tingling or new weaknesses Behavioral/Psych: Mood is stable, no new changes  Extremities: No lower extremity edema Breast:  denies any pain or lumps or nodules in either breasts All other systems were reviewed with the patient and are negative.  I have reviewed the past medical history, past surgical history, social history  and family history with the patient and they are unchanged from previous note.  ALLERGIES:  is allergic to sulfa antibiotics.  MEDICATIONS:  Current Outpatient Medications  Medication Sig Dispense Refill  . Minocycline HCl 90 MG TB24 Take 1 tablet by mouth as needed.  6  .  tamoxifen (NOLVADEX) 20 MG tablet Take 1 tablet (20 mg total) by mouth daily. 90 tablet 3  . venlafaxine XR (EFFEXOR-XR) 75 MG 24 hr capsule Take 1 capsule (75 mg total) by mouth daily with breakfast. 90 capsule 3   No current facility-administered medications for this visit.     PHYSICAL EXAMINATION: ECOG PERFORMANCE STATUS: 1 - Symptomatic but completely ambulatory  Vitals:   02/23/18 0827  BP: 133/83  Pulse: 83  Resp: 17  Temp: 98.4 F (36.9 C)  SpO2: 98%   Filed Weights   02/23/18 0827  Weight: 157 lb 6.4 oz (71.4 kg)    GENERAL:alert, no distress and comfortable SKIN: skin color, texture, turgor are normal, no rashes or significant lesions EYES: normal, Conjunctiva are pink and non-injected, sclera clear OROPHARYNX:no exudate, no erythema and lips, buccal mucosa, and tongue normal  NECK: supple, thyroid normal size, non-tender, without nodularity LYMPH:  no palpable lymphadenopathy in the cervical, axillary or inguinal LUNGS: clear to auscultation and percussion with normal breathing effort HEART: regular rate & rhythm and no murmurs and no lower extremity edema ABDOMEN:abdomen soft, non-tender and normal bowel sounds MUSCULOSKELETAL:no cyanosis of digits and no clubbing  NEURO: alert & oriented x 3 with fluent speech, no focal motor/sensory deficits EXTREMITIES: No lower extremity edema BREAST: No palpable masses or nodules in either right or left breasts. No palpable axillary supraclavicular or infraclavicular adenopathy no breast tenderness or nipple discharge. (exam performed in the presence of a chaperone)  LABORATORY DATA:  I have reviewed the data as listed CMP Latest Ref Rng & Units 02/01/2018 12/26/2017 02/22/2017  Glucose 65 - 99 mg/dL - 92 90  BUN 6 - 24 mg/dL - 13 15.0  Creatinine 0.57 - 1.00 mg/dL - 0.78 0.8  Sodium 134 - 144 mmol/L - 141 140  Potassium 3.5 - 5.2 mmol/L - 4.7 3.8  Chloride 96 - 106 mmol/L - 102 -  CO2 20 - 29 mmol/L - 24 27  Calcium 8.7 -  10.2 mg/dL - 9.2 9.8  Total Protein 6.5 - 8.1 g/dL 7.2 5.9(L) 6.6  Total Bilirubin 0.3 - 1.2 mg/dL 0.7 0.5 0.44  Alkaline Phos 38 - 126 U/L 73 72 81  AST 15 - 41 U/L 69(H) 79(H) 25  ALT 14 - 54 U/L 72(H) 75(H) 31    Lab Results  Component Value Date   WBC 4.7 02/01/2018   HGB 13.6 02/01/2018   HCT 40.9 02/01/2018   MCV 97.1 02/01/2018   PLT 228 02/01/2018   NEUTROABS 5.5 02/22/2017    ASSESSMENT & PLAN:  Malignant neoplasm of upper-inner quadrant of right breast in female, estrogen receptor positive ([facility]CC) Right lumpectomy: IDC with DCIS, 1 cm, margins negative, 0/4 lymph nodes, ER 100%, PR 95%, Ki-67 30%, HER-2 negative ratio 1.31, T1 BN 0 stage IA Oncotype DX score 27: 18% risk of recurrence with tamoxifen alone intermediate risk.  Treatment summary: 1. adjuvant chemotherapy with Taxotere and Cytoxan every 3 weeks 4 started 12/21/2016 Completed 02/22/2017  2. radiation therapy 04/04/2017 - 05/18/2017 3. Adjuvant antiestrogen therapy with tamoxifen 20 mg daily started 05/18/2017 (last menstrual cycle was April 2018)  Tamoxifen toxicities: Denies any hot flashes or myalgias. Patient's last menstrual  cycle was about a year ago.  I would like to send for [facility] and estradiol today.  If she is menopausal then we will switch her to letrozole. I will have to call in a prescription for letrozole if she is menopausal.  Breast Cancer Surveillance: 1. Breast exam 02/23/2018: Benign 2. Mammogram will need to be done January 2020  Return to clinic in 1 year for follow-up     Orders Placed This Encounter  Procedures  . Follicle stimulating hormone    Standing Status:   Future    Standing Expiration Date:   03/30/2019  . Estradiol, Ultra Sens   The patient has a good understanding of the overall plan. she agrees with it. she will call with any problems that may develop before the next visit here.   Harriette Ohara, MD 02/23/18

## 2018-02-23 NOTE — Telephone Encounter (Signed)
Gave patient avs and calendar of upcoming June 2020 appointments.  °

## 2018-02-24 ENCOUNTER — Telehealth: Payer: Self-pay | Admitting: Hematology and Oncology

## 2018-02-24 LAB — FOLLICLE STIMULATING HORMONE: FSH: 61.8 m[IU]/mL

## 2018-02-24 MED ORDER — LETROZOLE 2.5 MG PO TABS: 2.5000 mg | ORAL_TABLET | Freq: Every day | ORAL | 3 refills | Status: DC

## 2018-02-24 NOTE — Telephone Encounter (Signed)
I called and left a message that the [facility] suggest that she is in menopause. I instructed her to stop tamoxifen and start letrozole 2.5 mg daily. She will call us if she has any problems or concerns.

## 2018-02-26 LAB — ESTRADIOL, ULTRA SENS: Estradiol, Sensitive: 5.6 pg/mL

## 2018-03-09 ENCOUNTER — Other Ambulatory Visit: Payer: Self-pay

## 2018-03-09 ENCOUNTER — Ambulatory Visit (AMBULATORY_SURGERY_CENTER): Payer: Self-pay | Admitting: *Deleted

## 2018-03-09 VITALS — Ht 66.0 in | Wt 158.8 lb

## 2018-03-09 DIAGNOSIS — Z1211 Encounter for screening for malignant neoplasm of colon: Secondary | ICD-10-CM

## 2018-03-09 MED ORDER — NA SULFATE-K SULFATE-MG SULF 17.5-3.13-1.6 GM/177ML PO SOLN: 1.0000 | Freq: Once | ORAL | 0 refills | Status: AC

## 2018-03-09 MED FILL — SUPREP BOWEL PREP KIT: 17.5-3.13-1 | 2 days supply | Qty: 354 | Fill #0

## 2018-03-09 NOTE — Progress Notes (Signed)
No egg or soy allergy known to patient  No issues with past sedation with any surgeries  or procedures, no intubation problems  No diet pills per patient No home 02 use per patient  No blood thinners per patient  Pt denies issues with constipation  No A fib or A flutter  EMMI video sent to pt's e mail pt declined   

## 2018-03-17 ENCOUNTER — Encounter: Payer: Self-pay | Admitting: Internal Medicine

## 2018-03-27 MED FILL — LETROZOLE 2.5 MG TABLET: 2.5 | 90 days supply | Qty: 90 | Fill #0

## 2018-03-28 ENCOUNTER — Other Ambulatory Visit: Payer: Self-pay

## 2018-03-31 ENCOUNTER — Encounter: Payer: Self-pay | Admitting: Internal Medicine

## 2018-03-31 ENCOUNTER — Ambulatory Visit (AMBULATORY_SURGERY_CENTER): Payer: 59 | Admitting: Internal Medicine

## 2018-03-31 VITALS — BP 133/74 | HR 78 | Temp 99.3°F | Resp 15 | Ht 66.0 in | Wt 158.8 lb

## 2018-03-31 DIAGNOSIS — Z1211 Encounter for screening for malignant neoplasm of colon: Secondary | ICD-10-CM | POA: Diagnosis present

## 2018-03-31 MED ORDER — SODIUM CHLORIDE 0.9 % IV SOLN: 500.0000 mL | INTRAVENOUS | Status: AC

## 2018-03-31 NOTE — Patient Instructions (Addendum)
Your colonoscopy was NORMAL!  Next routine colonoscopy or other screening test in 10 years - 2029  I appreciate the opportunity to care for you. Gatha Mayer, MD, FACG   YOU HAD AN ENDOSCOPIC PROCEDURE TODAY AT [facility]:   Refer to the procedure report that was given to you for any specific questions about what was found during the examination.  If the procedure report does not answer your questions, please call your gastroenterologist to clarify.  If you requested that your care partner not be given the details of your procedure findings, then the procedure report has been included in a sealed envelope for you to review at your convenience later.  YOU SHOULD EXPECT: Some feelings of bloating in the abdomen. Passage of more gas than usual.  Walking can help get rid of the air that was put into your GI tract during the procedure and reduce the bloating. If you had a lower endoscopy (such as a colonoscopy or flexible sigmoidoscopy) you may notice spotting of blood in your stool or on the toilet paper. If you underwent a bowel prep for your procedure, you may not have a normal bowel movement for a few days.  Please Note:  You might notice some irritation and congestion in your nose or some drainage.  This is from the oxygen used during your procedure.  There is no need for concern and it should clear up in a day or so.  SYMPTOMS TO REPORT IMMEDIATELY:   Following lower endoscopy (colonoscopy or flexible sigmoidoscopy):  Excessive amounts of blood in the stool  Significant tenderness or worsening of abdominal pains  Swelling of the abdomen that is new, acute  Fever of 100F or higher   For urgent or emergent issues, a gastroenterologist can be reached at any hour by calling [phone].   DIET:  We do recommend a small meal at first, but then you may proceed to your regular diet.  Drink plenty of fluids but you should avoid alcoholic beverages for 24  hours.  ACTIVITY:  You should plan to take it easy for the rest of today and you should NOT DRIVE or use heavy machinery until tomorrow (because of the sedation medicines used during the test).    FOLLOW UP: Our staff will call the number listed on your records the next business day following your procedure to check on you and address any questions or concerns that you may have regarding the information given to you following your procedure. If we do not reach you, we will leave a message.  However, if you are feeling well and you are not experiencing any problems, there is no need to return our call.  We will assume that you have returned to your regular daily activities without incident.  If any biopsies were taken you will be contacted by phone or by letter within the next 1-3 weeks.  Please call us at [phone] if you have not heard about the biopsies in 3 weeks.    SIGNATURES/CONFIDENTIALITY: You and/or your care partner have signed paperwork which will be entered into your electronic medical record.  These signatures attest to the fact that that the information above on your After Visit Summary has been reviewed and is understood.  Full responsibility of the confidentiality of this discharge information lies with you and/or your care-partner.YOU HAD AN ENDOSCOPIC PROCEDURE TODAY AT THE [facility]:   Refer to the procedure report that was given to you  for any specific questions about what was found during the examination.  If the procedure report does not answer your questions, please call your gastroenterologist to clarify.  If you requested that your care partner not be given the details of your procedure findings, then the procedure report has been included in a sealed envelope for you to review at your convenience later.  YOU SHOULD EXPECT: Some feelings of bloating in the abdomen. Passage of more gas than usual.  Walking can help get rid of the air that was put into your GI  tract during the procedure and reduce the bloating. If you had a lower endoscopy (such as a colonoscopy or flexible sigmoidoscopy) you may notice spotting of blood in your stool or on the toilet paper. If you underwent a bowel prep for your procedure, you may not have a normal bowel movement for a few days.  Please Note:  You might notice some irritation and congestion in your nose or some drainage.  This is from the oxygen used during your procedure.  There is no need for concern and it should clear up in a day or so.  SYMPTOMS TO REPORT IMMEDIATELY:   Following lower endoscopy (colonoscopy or flexible sigmoidoscopy):  Excessive amounts of blood in the stool  Significant tenderness or worsening of abdominal pains  Swelling of the abdomen that is new, acute  Fever of 100F or higher   Following upper endoscopy (EGD)  Vomiting of blood or coffee ground material  New chest pain or pain under the shoulder blades  Painful or persistently difficult swallowing  New shortness of breath  Fever of 100F or higher  Black, tarry-looking stools  For urgent or emergent issues, a gastroenterologist can be reached at any hour by calling [phone].   DIET:  We do recommend a small meal at first, but then you may proceed to your regular diet.  Drink plenty of fluids but you should avoid alcoholic beverages for 24 hours.  ACTIVITY:  You should plan to take it easy for the rest of today and you should NOT DRIVE or use heavy machinery until tomorrow (because of the sedation medicines used during the test).    FOLLOW UP: Our staff will call the number listed on your records the next business day following your procedure to check on you and address any questions or concerns that you may have regarding the information given to you following your procedure. If we do not reach you, we will leave a message.  However, if you are feeling well and you are not experiencing any problems, there is no need to  return our call.  We will assume that you have returned to your regular daily activities without incident.  If any biopsies were taken you will be contacted by phone or by letter within the next 1-3 weeks.  Please call us at [phone] if you have not heard about the biopsies in 3 weeks.    SIGNATURES/CONFIDENTIALITY: You and/or your care partner have signed paperwork which will be entered into your electronic medical record.  These signatures attest to the fact that that the information above on your After Visit Summary has been reviewed and is understood.  Full responsibility of the confidentiality of this discharge information lies with you and/or your care-partner.

## 2018-03-31 NOTE — Progress Notes (Signed)
Alert and oriented x3, pleased with MAC, report to RN  

## 2018-03-31 NOTE — Op Note (Signed)
[facility] Patient Name: Lisa Anthony Procedure Date: 03/31/2018 7:35 AM MRN: [id number] Endoscopist: Gatha Mayer , MD Age: 52 Referring MD:  Date of Birth: 03-10-1966 Gender: Female Account #: [id number] Procedure:                Colonoscopy Indications:              Screening for colorectal malignant neoplasm, This                            is the patient's first colonoscopy Medicines:                Propofol per Anesthesia, Monitored Anesthesia Care Procedure:                Pre-Anesthesia Assessment:                           - Prior to the procedure, a History and Physical                            was performed, and patient medications and                            allergies were reviewed. The patient's tolerance of                            previous anesthesia was also reviewed. The risks                            and benefits of the procedure and the sedation                            options and risks were discussed with the patient.                            All questions were answered, and informed consent                            was obtained. Prior Anticoagulants: The patient has                            taken no previous anticoagulant or antiplatelet                            agents. ASA Grade Assessment: II - A patient with                            mild systemic disease. After reviewing the risks                            and benefits, the patient was deemed in                            satisfactory condition to undergo the procedure.  After obtaining informed consent, the colonoscope                            was passed under direct vision. Throughout the                            procedure, the patient's blood pressure, pulse, and                            oxygen saturations were monitored continuously. The                            Colonoscope was introduced through the anus and   advanced to the the cecum, identified by                            appendiceal orifice and ileocecal valve. The                            quality of the bowel preparation was excellent. The                            colonoscopy was performed without difficulty. The                            patient tolerated the procedure well. The bowel                            preparation used was SUPREP. Scope In: 8:11:39 AM Scope Out: 8:27:40 AM Scope Withdrawal Time: 0 hours 13 minutes 7 seconds  Total Procedure Duration: 0 hours 16 minutes 1 second  Findings:                 The perianal and digital rectal examinations were                            normal.                           The colon (entire examined portion) appeared normal.                           No additional abnormalities were found on                            retroflexion. Complications:            No immediate complications. Estimated blood loss:                            None. Estimated Blood Loss:     Estimated blood loss: none. Impression:               - The entire examined colon is normal.                           - No specimens collected. Recommendation:           -  Repeat colonoscopy/other appropriate test in 10                            years for screening purposes.                           - Patient has a contact number available for                            emergencies. The signs and symptoms of potential                            delayed complications were discussed with the                            patient. Return to normal activities tomorrow.                            Written discharge instructions were provided to the                            patient.                           - Resume previous diet.                           - Continue present medications. Gatha Mayer, MD 03/31/2018 8:35:37 AM This report has been signed electronically.

## 2018-03-31 NOTE — Progress Notes (Signed)
No changes in medical or surgical hx since PV per pt.  All medications reconciled

## 2018-04-03 ENCOUNTER — Telehealth: Payer: Self-pay

## 2018-04-03 NOTE — Telephone Encounter (Signed)
  Follow up Call-  Call back number 03/31/2018  Post procedure Call Back phone  # [phone]  Permission to leave phone message Yes  Some recent data might be hidden     Patient questions:  Do you have a fever, pain , or abdominal swelling? No. Pain Score  0 *  Have you tolerated food without any problems? Yes.    Have you been able to return to your normal activities? Yes.    Do you have any questions about your discharge instructions: Diet   No. Medications  No. Follow up visit  No.  Do you have questions or concerns about your Care? No.  Actions: * If pain score is 4 or above: No action needed, pain <4.

## 2018-04-24 MED FILL — VENLAFAXINE HCL ER 75 MG CA: 75 | 90 days supply | Qty: 90 | Fill #3

## 2018-06-26 MED FILL — LETROZOLE 2.5 MG TABLET: 2.5 | 90 days supply | Qty: 90 | Fill #1

## 2018-07-09 IMAGING — US US BREAST BX W LOC DEV 1ST LESION IMG BX SPEC US GUIDE*R*
1 series · 13 of 14 positions shown · non-contrast
Comparison: Previous exam(s).

ADDENDUM:
Pathology revealed grade III invasive ductal carcinoma in the right
breast. This was found to be concordant by Dr. Fallon Jim.
Pathology results were discussed with the patient by telephone. The
patient reported doing well after the biopsy. Post biopsy
instructions and care were reviewed and questions were answered. The
patient was encouraged to call [REDACTED] for any additional concerns. The patient was referred to [REDACTED] [REDACTED] at the [HOSPITAL]
[HOSPITAL] on October 27, 2016. The patient is scheduled to
return to The [REDACTED] for a right
axillary lymph node biopsy on [REDACTED], October 25, 2016.

Pathology results reported by Ramma Abacousnac RN, BSN on 10/21/2016.
CLINICAL DATA: Patient presents for ultrasound-guided core needle
biopsy of a 12:30 o'clock right breast mass.
EXAM:
ULTRASOUND GUIDED RIGHT BREAST CORE NEEDLE BIOPSY

[Series 1: us breast bx w loc dev 1st lesion img bx spec us g · 0.07mm/px · 13 of 14 slices shown]
[im 1/14]
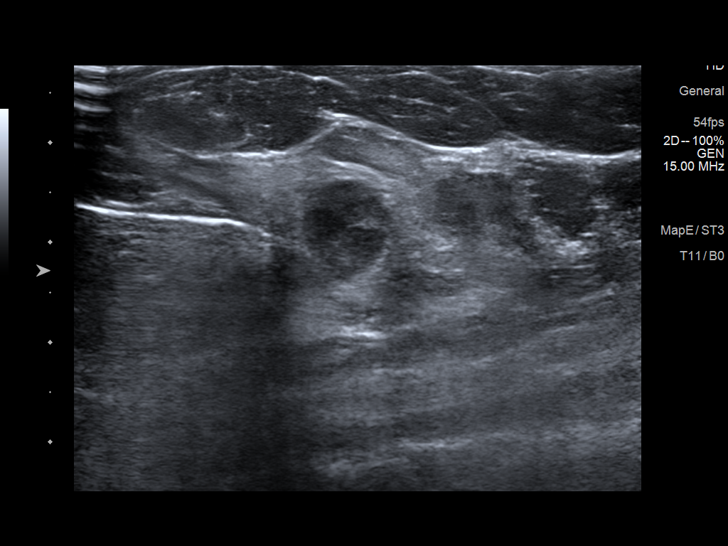
[im 2/14]
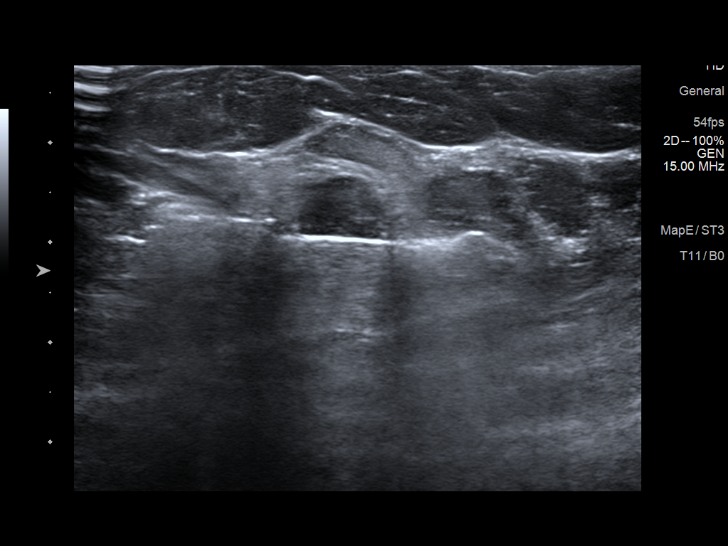
[im 3/14]
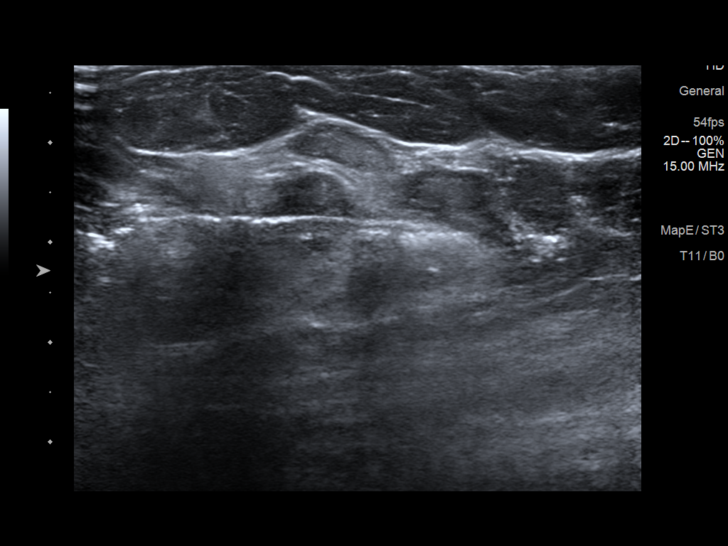
[im 4/14]
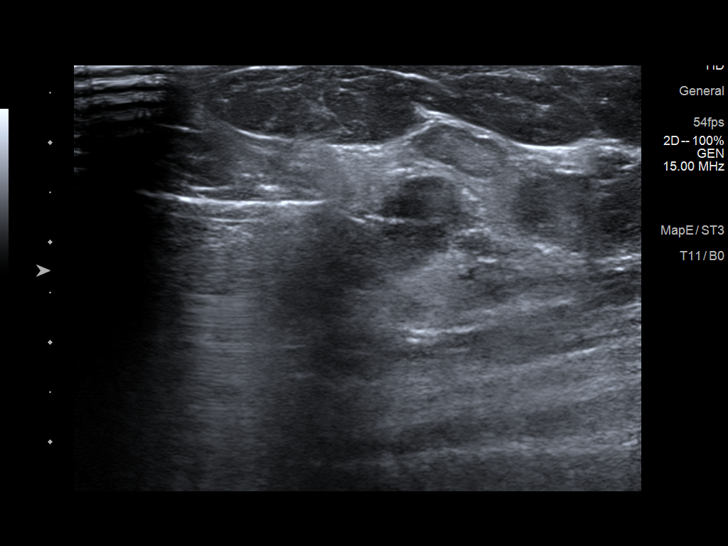
[im 5/14]
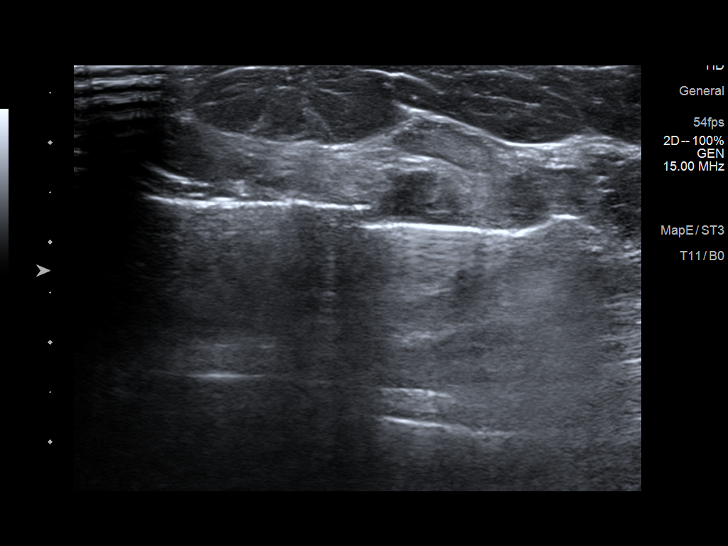
[im 6/14]
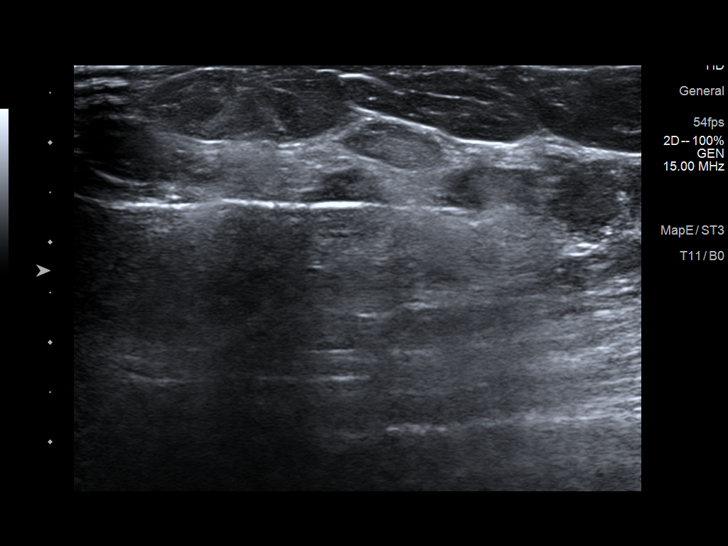
[im 8/14]
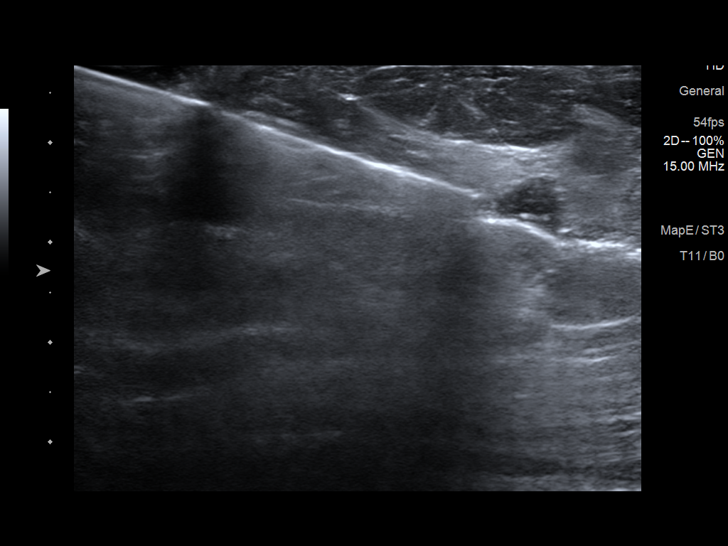
[im 9/14]
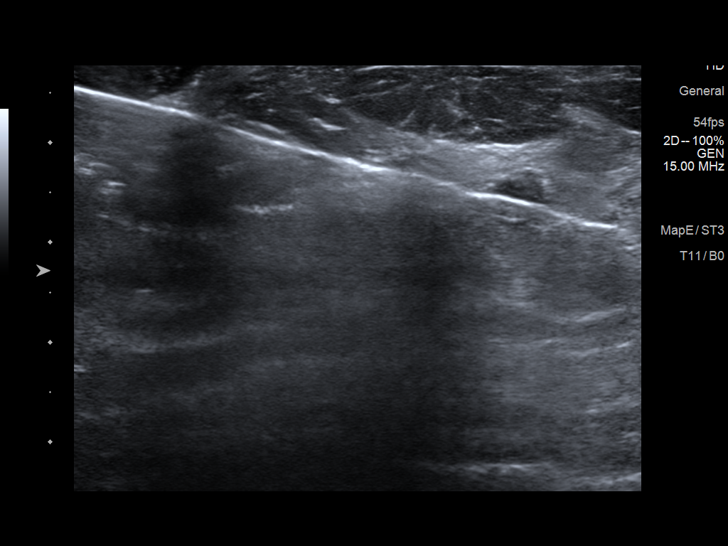
[im 10/14]
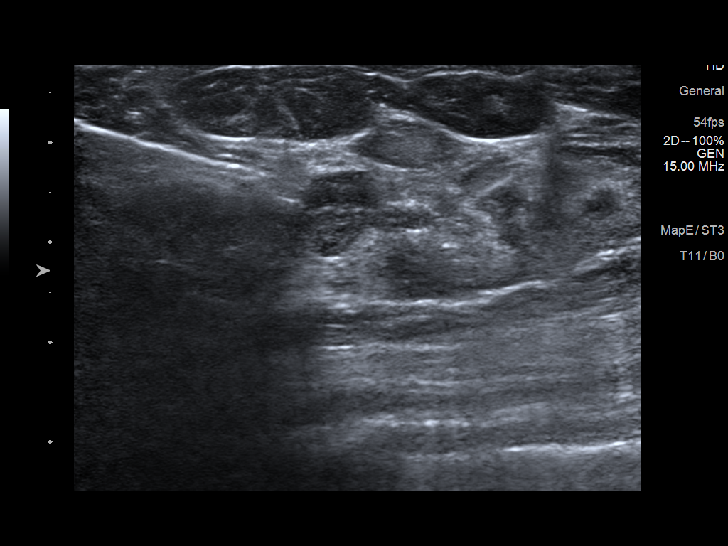
[im 11/14]
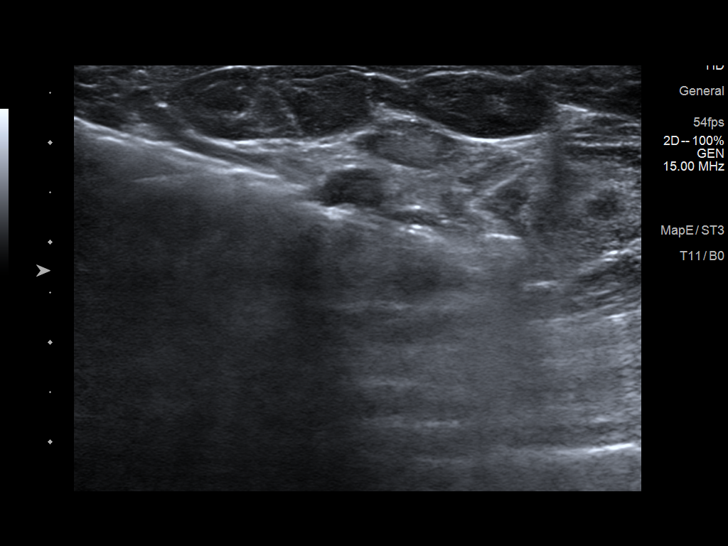
[im 12/14]
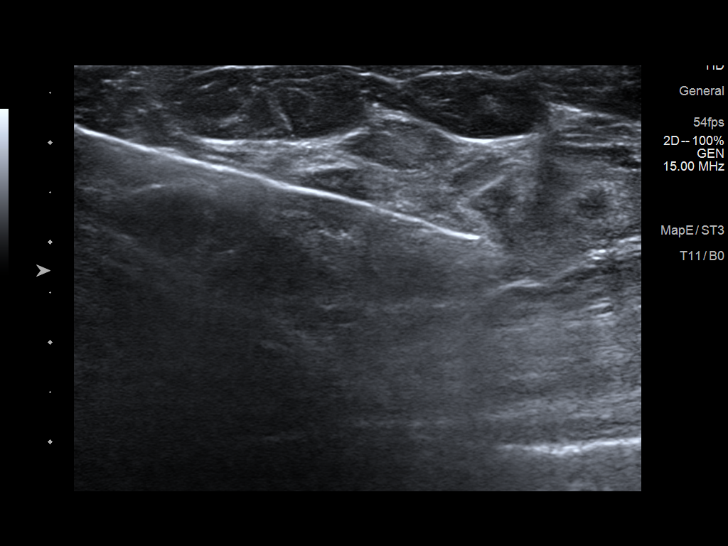
[im 13/14]
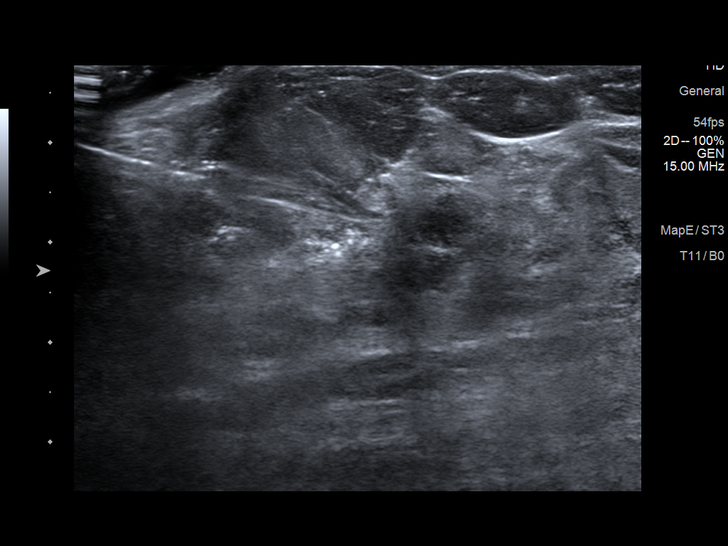
[im 14/14]
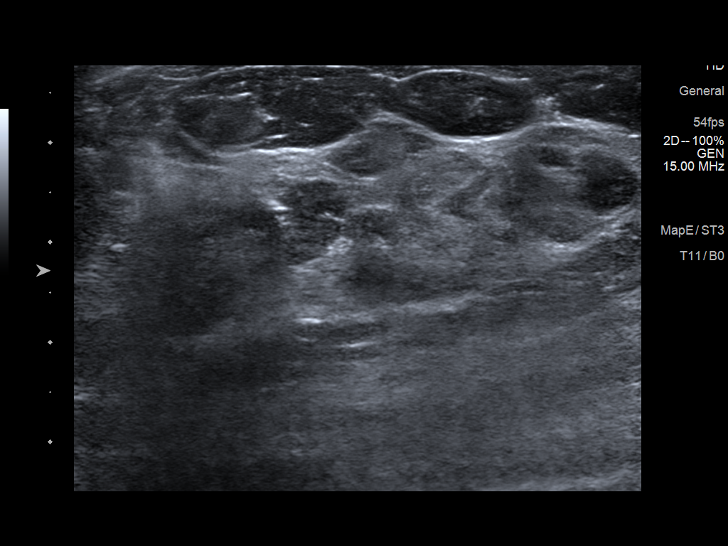

[13 of 14 positions shown; findings below may reference images not displayed]



Using sterile technique and 1% Lidocaine as local anesthetic, under
direct ultrasound visualization, a 12 gauge Ceejay device was
used to perform biopsy of the upper inner quadrant right breast mass
using an inferior, medial approach. At the conclusion of the
procedure a ribbon shaped tissue marker clip was deployed into the
biopsy cavity. Follow up 2 view mammogram was performed and dictated
separately.
IMPRESSION: Ultrasound guided biopsy of a right breast mass. No apparent
complications.

## 2018-07-19 ENCOUNTER — Other Ambulatory Visit: Payer: Self-pay | Admitting: Hematology and Oncology

## 2018-07-19 DIAGNOSIS — Z170 Estrogen receptor positive status [ER+]: Principal | ICD-10-CM

## 2018-07-19 DIAGNOSIS — C50211 Malignant neoplasm of upper-inner quadrant of right female breast: Secondary | ICD-10-CM

## 2018-07-19 MED FILL — VENLAFAXINE HCL ER 75 MG CA: 75 | 90 days supply | Qty: 90 | Fill #0

## 2018-08-05 IMAGING — MG MM PLC BREAST LOC DEV 1ST LESION INC*R*
2 series · 2 of 2 positions shown · non-contrast
Comparison: Previous exam(s).

CLINICAL DATA: Patient presents for seed localization prior to
lumpectomy for known invasive ductal carcinoma in the right breast.

EXAM:
MAMMOGRAPHIC GUIDED RADIOACTIVE SEED LOCALIZATION OF THE RIGHT
BREAST

[R CC]
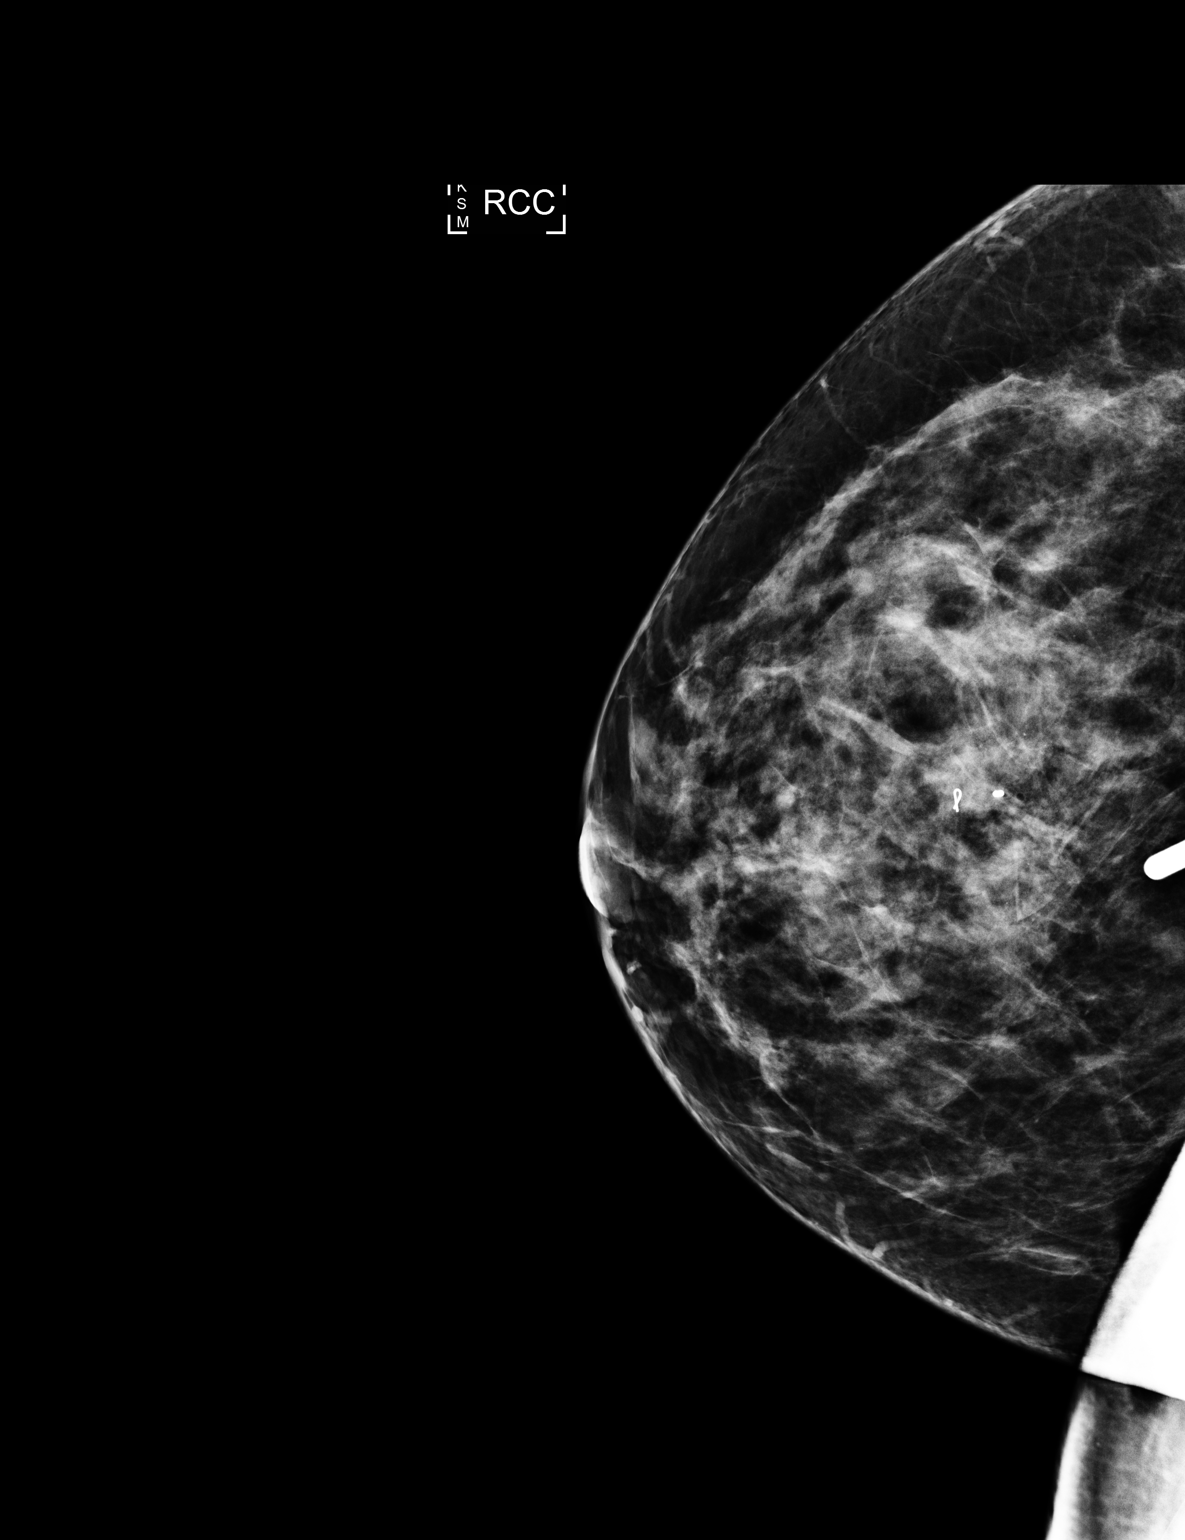

[R ML]
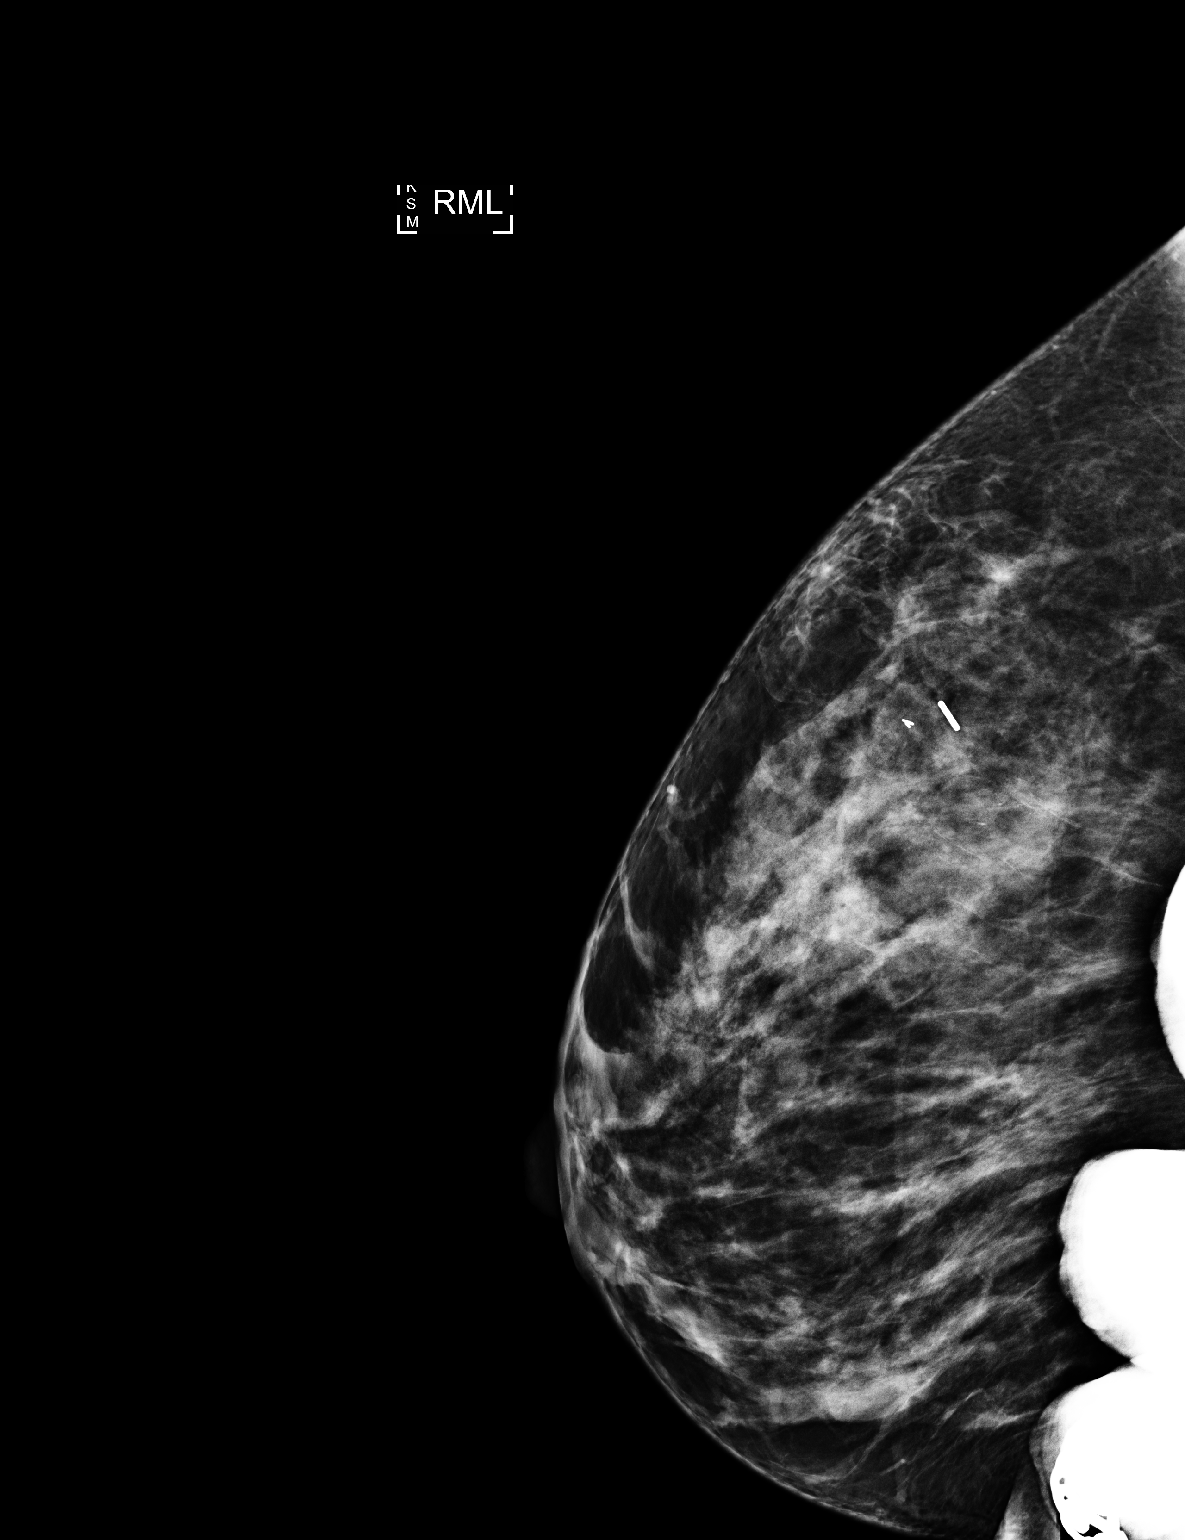

[2 of 2 positions shown; findings below may reference images not displayed]

FINDINGS: Patient presents for radioactive seed localization prior to
lumpectomy. I met with the patient and we discussed the procedure of
seed localization including benefits and alternatives. We discussed
the high likelihood of a successful procedure. We discussed the
risks of the procedure including infection, bleeding, tissue injury
and further surgery. We discussed the low dose of radioactivity
involved in the procedure. Informed, written consent was given.

The usual time-out protocol was performed immediately prior to the
procedure.

Using mammographic guidance, sterile technique, 1% lidocaine and an
[id number] radioactive seed, the ribbon shaped clip in the upper portion
of the right breast was localized using a cephalad approach. The
follow-up mammogram images confirm the seed in the expected location
and were marked for Dr. Jim.

Follow-up survey of the patient confirms presence of the radioactive
seed.

Order number of [id number] seed:  [id number].

Total activity:  0.249 millicurie  Reference Date: 11/02/2016

The patient tolerated the procedure well and was released from the
[REDACTED]. She was given instructions regarding seed removal.
IMPRESSION: Radioactive seed localization of the right breast. No apparent
complications.

## 2018-09-01 IMAGING — CR DG CHEST 1V PORT
1 series · 1 of 1 positions shown · non-contrast
Comparison: 12/13/2015.

CLINICAL DATA: Chronic shortness of breath.  Port-A-Cath placement.

EXAM:
PORTABLE CHEST 1 VIEW

[AP]
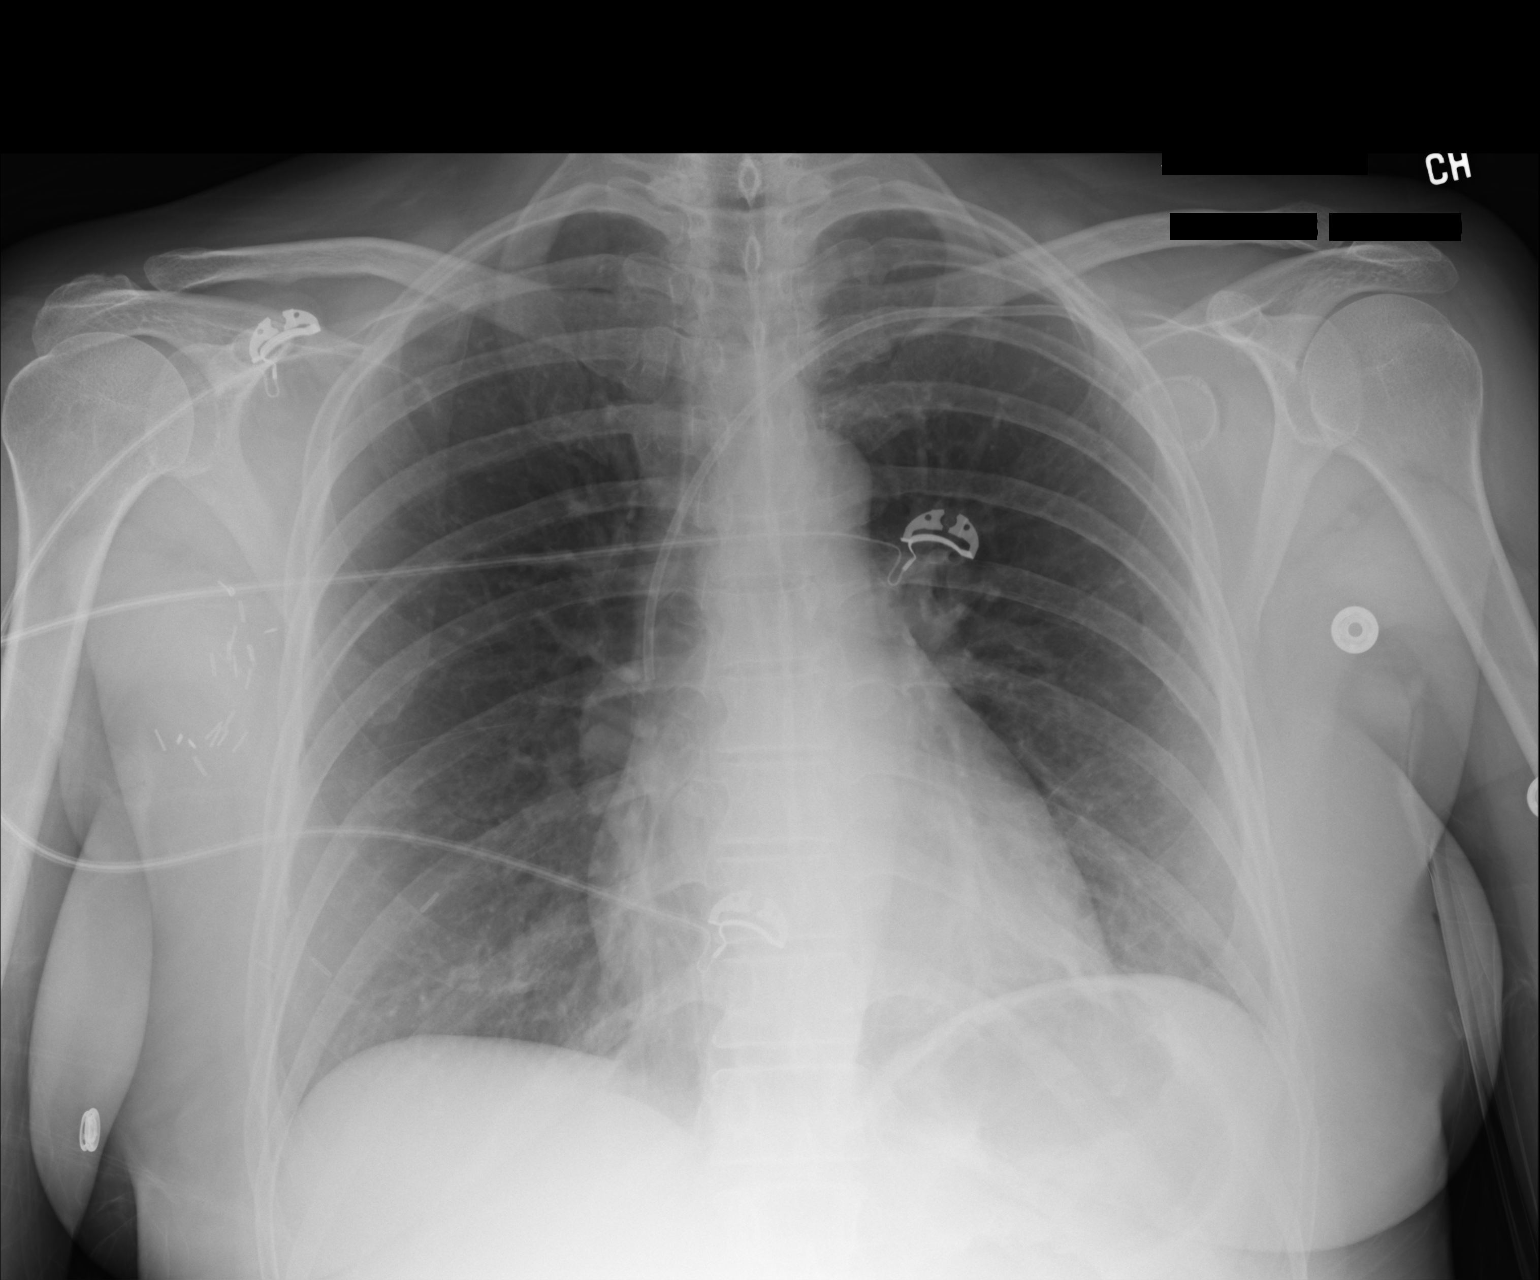

[1 of 1 positions shown; findings below may reference images not displayed]

FINDINGS: Trachea is midline. Left subclavian Port-A-Cath projects over the
SVC. Heart size normal. Lungs are clear. No pneumothorax. There may
be minimal bibasilar atelectasis. No pleural fluid. Surgical clips
in the right axilla.
IMPRESSION: 1. Left subclavian Port-A-Cath placement without pneumothorax.
2. Suspect mild bibasilar atelectasis.

## 2018-09-12 ENCOUNTER — Other Ambulatory Visit: Payer: Self-pay | Admitting: Hematology and Oncology

## 2018-09-12 DIAGNOSIS — Z853 Personal history of malignant neoplasm of breast: Secondary | ICD-10-CM

## 2018-09-25 MED FILL — LETROZOLE 2.5 MG TABLET: 2.5 | 90 days supply | Qty: 90 | Fill #2

## 2018-10-02 DIAGNOSIS — L719 Rosacea, unspecified: Secondary | ICD-10-CM | POA: Diagnosis not present

## 2018-10-02 DIAGNOSIS — L918 Other hypertrophic disorders of the skin: Secondary | ICD-10-CM | POA: Diagnosis not present

## 2018-10-19 ENCOUNTER — Ambulatory Visit
Admission: RE | Admit: 2018-10-19 | Discharge: 2018-10-19 | Disposition: A | Discharge status: Home / Self Care | Payer: 59 | Source: Ambulatory Visit | Attending: Hematology and Oncology | Admitting: Hematology and Oncology

## 2018-10-19 DIAGNOSIS — Z853 Personal history of malignant neoplasm of breast: Secondary | ICD-10-CM

## 2018-10-19 DIAGNOSIS — R922 Inconclusive mammogram: Secondary | ICD-10-CM | POA: Diagnosis not present

## 2018-10-19 HISTORY — DX: Personal history of irradiation: Z92.3

## 2018-10-19 HISTORY — DX: Personal history of antineoplastic chemotherapy: Z92.21

## 2018-10-25 MED FILL — VENLAFAXINE HCL ER 75 MG CA: 75 | 90 days supply | Qty: 90 | Fill #1

## 2018-10-25 MED FILL — MINOCYCLINE HCL 75 MG CAPS: 75 | 30 days supply | Qty: 30 | Fill #0

## 2018-10-31 ENCOUNTER — Telehealth: Payer: 59 | Admitting: Physician Assistant

## 2018-10-31 DIAGNOSIS — J069 Acute upper respiratory infection, unspecified: Secondary | ICD-10-CM

## 2018-10-31 DIAGNOSIS — B9789 Other viral agents as the cause of diseases classified elsewhere: Secondary | ICD-10-CM

## 2018-10-31 MED ORDER — IPRATROPIUM BROMIDE 0.06 % NA SOLN: 2.0000 | Freq: Four times a day (QID) | NASAL | 0 refills | Status: DC

## 2018-10-31 MED ORDER — BENZONATATE 100 MG PO CAPS: 100.0000 mg | ORAL_CAPSULE | Freq: Three times a day (TID) | ORAL | 0 refills | Status: DC

## 2018-10-31 MED FILL — BENZONATATE 100 MG CAP: 100 | 5 days supply | Qty: 30 | Fill #0

## 2018-10-31 MED FILL — IPRATROPIUM 0.06% SPRAY: 0.06 | 30 days supply | Qty: 15 | Fill #0

## 2018-10-31 NOTE — Progress Notes (Signed)
We are sorry that you are not feeling well.  Here is how we plan to help!  Based on your presentation I believe you most likely have A cough due to a virus.  This is called viral bronchitis and is best treated by rest, plenty of fluids and control of the cough.  You may use Ibuprofen or Tylenol as directed to help your symptoms.     In addition you may use A non-prescription cough medication called Robitussin DAC. Take 2 teaspoons every 8 hours or Delsym: take 2 teaspoons every 12 hours. Also, I have prescribed a nose spray that will help with post nasal drip along with tessalon pearls for cough.      From your responses in the eVisit questionnaire you describe inflammation in the upper respiratory tract which is causing a significant cough.  This is commonly called Bronchitis and has four common causes:   Allergies Viral Infections Acid Reflux Bacterial Infection Allergies, viruses and acid reflux are treated by controlling symptoms or eliminating the cause. An example might be a cough caused by taking certain blood pressure medications. You stop the cough by changing the medication. Another example might be a cough caused by acid reflux. Controlling the reflux helps control the cough.  USE OF BRONCHODILATOR ("RESCUE") INHALERS: There is a risk from using your bronchodilator too frequently.  The risk is that over-reliance on a medication which only relaxes the muscles surrounding the breathing tubes can reduce the effectiveness of medications prescribed to reduce swelling and congestion of the tubes themselves.  Although you feel brief relief from the bronchodilator inhaler, your asthma may actually be worsening with the tubes becoming more swollen and filled with mucus.  This can delay other crucial treatments, such as oral steroid medications. If you need to use a bronchodilator inhaler daily, several times per day, you should discuss this with your provider.  There are probably better treatments  that could be used to keep your asthma under control.     HOME CARE Only take medications as instructed by your medical team. Complete the entire course of an antibiotic. Drink plenty of fluids and get plenty of rest. Avoid close contacts especially the very young and the elderly Cover your mouth if you cough or cough into your sleeve. Always remember to wash your hands A steam or ultrasonic humidifier can help congestion.   GET HELP RIGHT AWAY IF: You develop worsening fever. You become short of breath You cough up blood. Your symptoms persist after you have completed your treatment plan MAKE SURE YOU  Understand these instructions. Will watch your condition. Will get help right away if you are not doing well or get worse.  Your e-visit answers were reviewed by a board certified advanced clinical practitioner to complete your personal care plan.  Depending on the condition, your plan could have included both over the counter or prescription medications. If there is a problem please reply once you have received a response from your provider. Your safety is important to [place].  If you have drug allergies check your prescription carefully.    You can use MyChart to ask questions about today's visit, request a non-urgent call back, or ask for a work or school excuse for 24 hours related to this e-Visit. If it has been greater than 24 hours you will need to follow up with your provider, or enter a new e-Visit to address those concerns. You will get an e-mail in the next two days asking about  your experience.  I hope that your e-visit has been valuable and will speed your recovery. Thank you for using e-visits.   ===View-only below this line===   ----- Message -----    From: Lisa Anthony    Sent: 10/31/2018  9:31 AM EST      To: E-Visit Mailing List Subject: E-Visit Submission: Cough  E-Visit Submission: Cough --------------------------------  Question: How long have you been  coughing? Answer:   3 days  Question: How would you describe the cough? Answer:   A cough from congested lungs  Question: How often are you coughing? Answer:   In spasms that come and go  Question: Does the cough prevent you from sleeping at night? Answer:   Yes  Question: What other symptoms have you experienced with the cough? Answer:   Sore throat            Chest pain  Question: Do you have a fever? Answer:   No, I do not have a fever  Question: Describe your sore throat: Answer:   On Sunday it started with a sore throat and a cough.  I really didn't have a runny nose or any other symptoms.  Today, it is a deep cough that sounds congested.  Sounds like bronchitis although I have never had that.  The congestion sounds like it's moving up.  Question: How long have you had a sore throat? Answer:   1 day  Question: Do you have any tenderness or swelling in your neck? Answer:   No  Question: Are you coughing up any mucus? Answer:   I am coughing up a little bit of mucus  Question: Do you use a maintenance inhaler? Answer:   No  Question: Do you use a rescue inhaler (such as Ventolin?) Answer:   No  Question: Have you previously required a prescription for prednisone for cough? Answer:   No  Question: Are you diabetic? Answer:   No  Question: What is the appearance of the mucus? Answer:   The mucus is thick            The mucus has changed from clear to colored  Question: Do you have any of the following? Answer:   None of the above  Question: Do you smoke? Answer:   No  Question: Have you ever smoked? Answer:   I smoked in the past, but quit  Question: Are there people you know with similar symptoms? Answer:   Yes  Question: Are you experiencing any of the following? Answer:   None of  the above  Question: Are you having difficulty breathing? Answer:   No  Question: Is your coughing worse when you are exposed to pollen, dust, or other things in the  environment? Answer:   No  Question: Have you been treated for a similar cough in the past? Answer:   No  Question: Have you ever been diagnosed with asthma, bronchitis, or lung disease? Answer:   No  Question: Have you recently started on any medications for your heart or for blood pressure? Answer:   No  Question: Have you recently been hospitalized? Answer:   No  Question: Please list your medication allergies that you may have ? (If 'none' , please list as 'none') Answer:   Sulfa drugs  Question: Please list any additional comments  Answer:

## 2018-12-11 MED FILL — LETROZOLE 2.5 MG TABLET: 2.5 | 60 days supply | Qty: 60 | Fill #3

## 2019-01-15 MED FILL — VENLAFAXINE HCL ER 75 MG CA: 75 | 90 days supply | Qty: 90 | Fill #2

## 2019-01-18 ENCOUNTER — Ambulatory Visit: Payer: 59 | Admitting: Obstetrics and Gynecology

## 2019-02-19 ENCOUNTER — Other Ambulatory Visit: Payer: Self-pay | Admitting: Hematology and Oncology

## 2019-02-20 MED FILL — LETROZOLE 2.5 MG TABLET: 2.5 | 90 days supply | Qty: 90 | Fill #0

## 2019-02-21 ENCOUNTER — Other Ambulatory Visit: Payer: Self-pay

## 2019-02-21 DIAGNOSIS — C50211 Malignant neoplasm of upper-inner quadrant of right female breast: Secondary | ICD-10-CM

## 2019-02-21 NOTE — Progress Notes (Signed)
Patient Care Team: Patient, No Pcp Per as PCP - General (General Practice) Lovell Sheehan, NP as Nurse Practitioner (Nurse Practitioner) Jovita Kussmaul, MD as Consulting Physician (General Surgery) Nicholas Lose, MD as Consulting Physician (Hematology and Oncology) Kyung Rudd, MD as Consulting Physician (Radiation Oncology) Gardenia Phlegm, NP as Nurse Practitioner (Hematology and Oncology)  DIAGNOSIS:    ICD-10-CM   1. Malignant neoplasm of upper-inner quadrant of right breast in female, estrogen receptor positive ([place]) C50.211    Z17.0     SUMMARY OF ONCOLOGIC HISTORY:   Malignant neoplasm of upper-inner quadrant of right breast in female, estrogen receptor positive ([place])   10/20/2016 Initial Diagnosis    Right breast biopsy 12:30 position: IDC grade 3, ER 100%, PR 95%, Ki-67 30%, HER-2 negative ratio 1.31, screening detected right breast asymmetry and calcifications 1.1 cm, right axillary borderline enlarged lymph node biopsy benign, T1c N0 stage IA clinical stage    11/17/2016 Genetic Testing    Testing was normal and did not reveal a mutation.  Genes tested include: APC, ATM, AXIN2, BARD1, BMPR1A, BRCA1, BRCA2, BRIP1, CDH1, CDKN2A, CHEK2, DICER1, EPCAM, GREM1, HOXB13, KIT, MEN1, MLH1, MSH2, MSH6, MUTYH, NBN, NF1, PALB2, PDGFRA, PMS2, POLD1, POLE, PTEN, RAD50, RAD51C, RAD51D, SDHA, SDHB, SDHC, SDHD, SMAD4, SMARCA4, STK11, TP53, TSC1, TSC2, VHL    11/18/2016 Surgery    Right lumpectomy: IDC with DCIS, 1 cm, margins negative, 0/4 lymph nodes, ER 100%, PR 95%, Ki-67 30%, HER-2 negative ratio 1.31, T1 BN 0 stage IA    11/25/2016 Oncotype testing    Oncotype DX score 27: 18% risk of recurrence with tamoxifen alone intermediate risk    12/21/2016 - 02/22/2017 Adjuvant Chemotherapy    Taxotere and Cytoxan x 4 cycles    04/04/2017 - 05/18/2017 Radiation Therapy    Adjuvant radiation therapy    05/18/2017 -  Anti-estrogen oral therapy    Tamoxifen 20 mg daily, switched to letrozole on  02/24/18 due to entering menopause     Genetic testing   11/16/2016 Initial Diagnosis    Genetic testing was negative for mutations in the 43 genes on [facility]'s Common Cancers panel (APC, ATM, AXIN2, BARD1, BMPR1A, BRCA1, BRCA2, BRIP1, CDH1, CDKN2A, CHEK2, DICER1, EPCAM, GREM1, HOXB13, KIT, MEN1, MLH1, MSH2, MSH6, MUTYH, NBN, NF1, PALB2, PDGFRA, PMS2, POLD1, POLE, PTEN, RAD50, RAD51C, RAD51D, SDHA, SDHB, SDHC, SDHD, SMAD4, SMARCA4, STK11, TP53, TSC1, TSC2, VHL).     CHIEF COMPLIANT: Follow-up on letrozole therapy  INTERVAL HISTORY: Lisa Anthony is a 53 y.o. with above-mentioned history of right breast cancer treated with lumpectomy, adjuvant chemotherapy, and radiation who is currently on letrozole. I last saw her a year ago. Her most recent mammogram on 10/19/18 showed no evidence of malignancy. She presents to the clinic today for annual follow-up.  She has tolerated letrozole fairly well with exception of joint stiffness and achiness.  The hot flashes are much better on Effexor 75 mg.  REVIEW OF SYSTEMS:   Constitutional: Denies fevers, chills or abnormal weight loss Eyes: Denies blurriness of vision Ears, nose, mouth, throat, and face: Denies mucositis or sore throat Respiratory: Denies cough, dyspnea or wheezes Cardiovascular: Denies palpitation, chest discomfort Gastrointestinal: Denies nausea, heartburn or change in bowel habits Skin: Denies abnormal skin rashes Lymphatics: Denies new lymphadenopathy or easy bruising Neurological: Denies numbness, tingling or new weaknesses Behavioral/Psych: Mood is stable, no new changes  Extremities: No lower extremity edema Breast: denies any pain or lumps or nodules in either breasts All other systems were reviewed with  the patient and are negative.  I have reviewed the past medical history, past surgical history, social history and family history with the patient and they are unchanged from previous note.  ALLERGIES:  is allergic to  sulfa antibiotics.  MEDICATIONS:  Current Outpatient Medications  Medication Sig Dispense Refill  . benzonatate (TESSALON) 100 MG capsule Take 1-2 capsules (100-200 mg total) by mouth 3 (three) times daily. 30 capsule 0  . ipratropium (ATROVENT) 0.06 % nasal spray Place 2 sprays into both nostrils 4 (four) times daily. 15 mL 0  . letrozole (FEMARA) 2.5 MG tablet TAKE 1 TABLET BY MOUTH ONCE DAILY 90 tablet 0  . Melatonin 3 MG TABS Take by mouth at bedtime.    . Minocycline HCl 90 MG TB24 Take 1 tablet by mouth as needed.  6  . venlafaxine XR (EFFEXOR-XR) 75 MG 24 hr capsule TAKE ONE CAPSULE BY MOUTH DAILY WITH BREAKFAST 360 capsule 0   Current Facility-Administered Medications  Medication Dose Route Frequency Provider Last Rate Last Dose  . 0.9 %  sodium chloride infusion  500 mL Intravenous Continuous Gatha Mayer, MD        PHYSICAL EXAMINATION: ECOG PERFORMANCE STATUS: 1 - Symptomatic but completely ambulatory  Vitals:   02/22/19 0857  BP: (!) 149/99  Pulse: 100  Resp: 17  Temp: 98.7 F (37.1 C)  SpO2: 98%   Filed Weights   02/22/19 0857  Weight: 169 lb 1.6 oz (76.7 kg)    GENERAL: alert, no distress and comfortable SKIN: skin color, texture, turgor are normal, no rashes or significant lesions EYES: normal, Conjunctiva are pink and non-injected, sclera clear OROPHARYNX: no exudate, no erythema and lips, buccal mucosa, and tongue normal  NECK: supple, thyroid normal size, non-tender, without nodularity LYMPH: no palpable lymphadenopathy in the cervical, axillary or inguinal LUNGS: clear to auscultation and percussion with normal breathing effort HEART: regular rate & rhythm and no murmurs and no lower extremity edema ABDOMEN: abdomen soft, non-tender and normal bowel sounds MUSCULOSKELETAL: no cyanosis of digits and no clubbing  NEURO: alert & oriented x 3 with fluent speech, no focal motor/sensory deficits EXTREMITIES: No lower extremity edema BREAST: No palpable  masses or nodules in either right or left breasts. No palpable axillary supraclavicular or infraclavicular adenopathy no breast tenderness or nipple discharge. (exam performed in the presence of a chaperone)  LABORATORY DATA:  I have reviewed the data as listed CMP Latest Ref Rng & Units 02/01/2018 12/26/2017 02/22/2017  Glucose 65 - 99 mg/dL - 92 90  BUN 6 - 24 mg/dL - 13 15.0  Creatinine 0.57 - 1.00 mg/dL - 0.78 0.8  Sodium 134 - 144 mmol/L - 141 140  Potassium 3.5 - 5.2 mmol/L - 4.7 3.8  Chloride 96 - 106 mmol/L - 102 -  CO2 20 - 29 mmol/L - 24 27  Calcium 8.7 - 10.2 mg/dL - 9.2 9.8  Total Protein 6.5 - 8.1 g/dL 7.2 5.9(L) 6.6  Total Bilirubin 0.3 - 1.2 mg/dL 0.7 0.5 0.44  Alkaline Phos 38 - 126 U/L 73 72 81  AST 15 - 41 U/L 69(H) 79(H) 25  ALT 14 - 54 U/L 72(H) 75(H) 31    Lab Results  Component Value Date   WBC 4.1 02/22/2019   HGB 14.5 02/22/2019   HCT 43.2 02/22/2019   MCV 96.2 02/22/2019   PLT 221 02/22/2019   NEUTROABS 2.5 02/22/2019    ASSESSMENT & PLAN:  Malignant neoplasm of upper-inner quadrant of right breast in  female, estrogen receptor positive ([place]) Right lumpectomy: IDC with DCIS, 1 cm, margins negative, 0/4 lymph nodes, ER 100%, PR 95%, Ki-67 30%, HER-2 negative ratio 1.31, T1 BN 0 stage IA Oncotype DX score 27: 18% risk of recurrence with tamoxifen alone intermediate risk.  Treatment summary: 1.adjuvant chemotherapy with Taxotere and Cytoxan every 3 weeks 4 started 12/21/2016 Completed 02/22/2017 2. radiation therapy07/16/2018-05/18/2017 3. Adjuvant antiestrogen therapywith tamoxifen 20 mg daily started 05/18/2017(last menstrual cycle was April 2018) switched to letrozole 02/24/2018 because she was menopausal  Letrozole toxicities: 1.  Joint stiffness: Even though these are moderate in severity, she is able to tolerate them. 2.  Arthritis: On Effexor 75 mg.  Doing very well  Breast Cancer Surveillance: 1. Breast exam  02/22/2019: Benign 2.  Mammogram  10/19/2018: Benign, breast density category C  Return to clinic in 1 year for follow-up  No orders of the defined types were placed in this encounter.  The patient has a good understanding of the overall plan. she agrees with it. she will call with any problems that may develop before the next visit here.  Nicholas Lose, MD 02/22/2019  Julious Oka Dorshimer am acting as scribe for Dr. Nicholas Lose.  I have reviewed the above documentation for accuracy and completeness, and I agree with the above.

## 2019-02-22 ENCOUNTER — Inpatient Hospital Stay (HOSPITAL_BASED_OUTPATIENT_CLINIC_OR_DEPARTMENT_OTHER): Payer: 59 | Admitting: Hematology and Oncology

## 2019-02-22 ENCOUNTER — Inpatient Hospital Stay: Payer: 59 | Attending: Hematology and Oncology

## 2019-02-22 ENCOUNTER — Other Ambulatory Visit: Payer: Self-pay

## 2019-02-22 DIAGNOSIS — Z9221 Personal history of antineoplastic chemotherapy: Secondary | ICD-10-CM | POA: Diagnosis not present

## 2019-02-22 DIAGNOSIS — Z170 Estrogen receptor positive status [ER+]: Secondary | ICD-10-CM

## 2019-02-22 DIAGNOSIS — Z923 Personal history of irradiation: Secondary | ICD-10-CM | POA: Insufficient documentation

## 2019-02-22 DIAGNOSIS — Z79899 Other long term (current) drug therapy: Secondary | ICD-10-CM

## 2019-02-22 DIAGNOSIS — M2560 Stiffness of unspecified joint, not elsewhere classified: Secondary | ICD-10-CM | POA: Diagnosis not present

## 2019-02-22 DIAGNOSIS — C50211 Malignant neoplasm of upper-inner quadrant of right female breast: Secondary | ICD-10-CM | POA: Diagnosis not present

## 2019-02-22 DIAGNOSIS — Z79811 Long term (current) use of aromatase inhibitors: Secondary | ICD-10-CM | POA: Diagnosis not present

## 2019-02-22 LAB — CBC WITH DIFFERENTIAL (CANCER CENTER ONLY)
Abs Immature Granulocytes: 0 10*3/uL (ref 0.00–0.07)
Basophils Absolute: 0.1 10*3/uL (ref 0.0–0.1)
Basophils Relative: 1 %
Eosinophils Absolute: 0.1 10*3/uL (ref 0.0–0.5)
Eosinophils Relative: 3 %
HCT: 43.2 % (ref 36.0–46.0)
Hemoglobin: 14.5 g/dL (ref 12.0–15.0)
Immature Granulocytes: 0 %
Lymphocytes Relative: 26 %
Lymphs Abs: 1.1 10*3/uL (ref 0.7–4.0)
MCH: 32.3 pg (ref 26.0–34.0)
MCHC: 33.6 g/dL (ref 30.0–36.0)
MCV: 96.2 fL (ref 80.0–100.0)
Monocytes Absolute: 0.4 10*3/uL (ref 0.1–1.0)
Monocytes Relative: 11 %
Neutro Abs: 2.5 10*3/uL (ref 1.7–7.7)
Neutrophils Relative %: 59 %
Platelet Count: 221 10*3/uL (ref 150–400)
RBC: 4.49 MIL/uL (ref 3.87–5.11)
RDW: 12.2 % (ref 11.5–15.5)
WBC Count: 4.1 10*3/uL (ref 4.0–10.5)
nRBC: 0 % (ref 0.0–0.2)

## 2019-02-22 LAB — CMP (CANCER CENTER ONLY)
ALT: 57 U/L — ABNORMAL HIGH (ref 0–44)
AST: 43 U/L — ABNORMAL HIGH (ref 15–41)
Albumin: 4.2 g/dL (ref 3.5–5.0)
Alkaline Phosphatase: 121 U/L (ref 38–126)
Anion gap: 14 (ref 5–15)
BUN: 16 mg/dL (ref 6–20)
CO2: 22 mmol/L (ref 22–32)
Calcium: 9.5 mg/dL (ref 8.9–10.3)
Chloride: 102 mmol/L (ref 98–111)
Creatinine: 0.86 mg/dL (ref 0.44–1.00)
GFR, Est AFR Am: >60 mL/min (ref >60)
GFR, Estimated: >60 mL/min (ref >60)
Glucose, Bld: 124 mg/dL — ABNORMAL HIGH (ref 70–99)
Potassium: 4.3 mmol/L (ref 3.5–5.1)
Sodium: 138 mmol/L (ref 135–145)
Total Bilirubin: 0.4 mg/dL (ref 0.3–1.2)
Total Protein: 7.7 g/dL (ref 6.5–8.1)

## 2019-02-22 LAB — SAMPLE TO BLOOD BANK

## 2019-02-22 MED ORDER — LETROZOLE 2.5 MG PO TABS: 2.5000 mg | ORAL_TABLET | Freq: Every day | ORAL | 3 refills | Status: DC

## 2019-02-22 MED ORDER — VENLAFAXINE HCL ER 75 MG PO CP24: ORAL_CAPSULE | ORAL | 3 refills | Status: DC

## 2019-02-22 NOTE — Assessment & Plan Note (Signed)
Right lumpectomy: IDC with DCIS, 1 cm, margins negative, 0/4 lymph nodes, ER 100%, PR 95%, Ki-67 30%, HER-2 negative ratio 1.31, T1 BN 0 stage IA Oncotype DX score 27: 18% risk of recurrence with tamoxifen alone intermediate risk.  Treatment summary: 1.adjuvant chemotherapy with Taxotere and Cytoxan every 3 weeks 4 started 12/21/2016 Completed 02/22/2017 2. radiation therapy07/16/2018-05/18/2017 3. Adjuvant antiestrogen therapywith tamoxifen 20 mg daily started 05/18/2017(last menstrual cycle was April 2018) switched to letrozole 02/24/2018 because she was menopausal  Letrozole toxicities:  Breast Cancer Surveillance: 1. Breast exam  02/22/2019: Benign 2. Mammogram  10/19/2018: Benign, breast density category C  Return to clinic in 1 year for follow-up

## 2019-03-06 ENCOUNTER — Other Ambulatory Visit: Payer: Self-pay

## 2019-03-07 ENCOUNTER — Encounter: Payer: Self-pay | Admitting: Obstetrics and Gynecology

## 2019-03-07 ENCOUNTER — Other Ambulatory Visit: Payer: Self-pay

## 2019-03-07 ENCOUNTER — Ambulatory Visit: Payer: 59 | Admitting: Obstetrics and Gynecology

## 2019-03-07 VITALS — BP 118/80 | HR 60 | Temp 98.2°F | Ht 65.0 in | Wt 167.2 lb

## 2019-03-07 DIAGNOSIS — Z853 Personal history of malignant neoplasm of breast: Secondary | ICD-10-CM | POA: Diagnosis not present

## 2019-03-07 DIAGNOSIS — Z0000 Encounter for general adult medical examination without abnormal findings: Secondary | ICD-10-CM | POA: Diagnosis not present

## 2019-03-07 DIAGNOSIS — M8580 Other specified disorders of bone density and structure, unspecified site: Secondary | ICD-10-CM

## 2019-03-07 DIAGNOSIS — Z9889 Other specified postprocedural states: Secondary | ICD-10-CM | POA: Diagnosis not present

## 2019-03-07 DIAGNOSIS — Z01419 Encounter for gynecological examination (general) (routine) without abnormal findings: Secondary | ICD-10-CM | POA: Diagnosis not present

## 2019-03-07 DIAGNOSIS — E559 Vitamin D deficiency, unspecified: Secondary | ICD-10-CM

## 2019-03-07 NOTE — Progress Notes (Signed)
53 y.o. G77P3003 Divorced White or Caucasian Not Hispanic or Latino female here for annual exam.  H/O endometrial ablation, cycles stopped in 2018 with chemotherapy. No vaginal bleeding. Not sexually active. She is good friends with her Ex.   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 Feels more productive at home.   H/o Right breast cancer in 2018, s/p lumpectomy, chemo and radiation. Negative genetic testing.     Patient's last menstrual period was 12/21/2016 (exact date).          Sexually active: No.  The current method of family planning is abstinence.    Exercising: Yes.    walking Smoker:  no  Health Maintenance: Pap:  12/21/2017 WNL NEG HPV, 01-10-12 WNL per patient  History of abnormal Pap:  no MMG:  10/19/2018 Birads 2 benign Colonoscopy:  03/31/18 normal BMD:   10/17/2017 Osteopenia, followed by Oncologist TDaP:  01-01-16  Gardasil: N/A   reports that she quit smoking about 20 years ago. Her smoking use included cigarettes. She has never used smokeless tobacco. She reports current alcohol use. She reports that she does not use drugs. 2-3 drinks a week. Works for [employer] in [employer].  3 kids: 33, 21 and 38. Oldest in college, nursing.   Past Medical History:  Diagnosis Date  . Amenorrhea   . Anxiety   . Breast cancer ([place])    right  . Chronic kidney disease    past hx kidney stones   . Elevated liver enzymes   . Fatty liver   . History of chemotherapy    stopped 02-22-2017  . History of kidney stones    yrs ago   . History of removal of Port-a-Cath 02/2017  . Hx of radiation therapy    stopped 04-2017  . Lesion of right lobe of liver   . Osteopenia   . Personal history of chemotherapy   . Personal history of radiation therapy   . Port-A-Cath in place   . Vitamin D deficiency     Past Surgical History:  Procedure Laterality Date  . BREAST LUMPECTOMY Right 11/18/2016   chemo and radiation  . BREAST LUMPECTOMY WITH RADIOACTIVE SEED AND SENTINEL LYMPH NODE BIOPSY  Right 11/18/2016   Procedure: BREAST LUMPECTOMY WITH RADIOACTIVE SEED AND SENTINEL LYMPH NODE BIOPSY;  Surgeon: Autumn Messing III, MD;  Location: [facility];  Service: General;  Laterality: Right;  . DILATATION & CURETTAGE/HYSTEROSCOPY WITH MYOSURE N/A 02/06/2018   Procedure: DILATATION & CURETTAGE/HYSTEROSCOPY *with ultrasound guidance*;  Surgeon: Salvadore Dom, MD;  Location: [facility];  Service: Gynecology;  Laterality: N/A;  need ultrasound guidance due to hx of ablation  . ENDOMETRIAL ABLATION    . HERNIA REPAIR     at birth  . PORT-A-CATH REMOVAL Left 03/14/2017   Procedure: REMOVAL PORT-A-CATH;  Surgeon: Jovita Kussmaul, MD;  Location: [facility];  Service: General;  Laterality: Left;  . PORTACATH PLACEMENT N/A 12/13/2016   Procedure: INSERTION PORT-A-CATH;  Surgeon: Autumn Messing III, MD;  Location: [facility];  Service: General;  Laterality: N/A;    Current Outpatient Medications  Medication Sig Dispense Refill  . letrozole (FEMARA) 2.5 MG tablet Take 1 tablet (2.5 mg total) by mouth daily. 90 tablet 3  . Melatonin 3 MG TABS Take by mouth at bedtime.    . Minocycline HCl 90 MG TB24 Take 1 tablet by mouth as needed.  6  . venlafaxine XR (EFFEXOR-XR) 75 MG 24 hr capsule  TAKE ONE CAPSULE BY MOUTH DAILY WITH BREAKFAST 90 capsule 3   Current Facility-Administered Medications  Medication Dose Route Frequency Provider Last Rate Last Dose  . 0.9 %  sodium chloride infusion  500 mL Intravenous Continuous Gatha Mayer, MD        Family History  Problem Relation Age of Onset  . Breast cancer Maternal Grandmother 32       bilateral  . Glaucoma Mother   . Heart attack Father 48  . Hyperlipidemia Father   . Breast cancer Cousin 49  . Hypertension Sister   . Colon cancer Neg Hx   . Colon polyps Neg Hx   . Esophageal cancer Neg Hx   . Rectal cancer Neg Hx   . Stomach cancer Neg Hx   . Pancreatic cancer Neg Hx     Review of Systems   Constitutional: Negative.   HENT: Negative.   Eyes: Negative.   Respiratory: Negative.   Cardiovascular: Negative.   Gastrointestinal: Negative.   Endocrine: Negative.   Genitourinary: Negative.   Musculoskeletal: Negative.   Skin: Negative.   Allergic/Immunologic: Negative.   Neurological: Negative.   Hematological: Negative.   Psychiatric/Behavioral: Negative.     Exam:   BP 118/80 (BP Location: Left Arm, Patient Position: Sitting, Cuff Size: Normal)   Pulse 60   Temp 98.2 F (36.8 C) (Skin)   Ht 5\' 5"  (1.651 m)   Wt 167 lb 3.2 oz (75.8 kg)   LMP 12/21/2016 (Exact Date)   BMI 27.82 kg/m   Weight change: @WEIGHTCHANGE @ Height:   Height: 5\' 5"  (165.1 cm)  Ht Readings from Last 3 Encounters:  03/07/19 5\' 5"  (1.651 m)  02/22/19 5\' 6"  (1.676 m)  03/31/18 5\' 6"  (1.676 m)    General appearance: alert, cooperative and appears stated age Head: Normocephalic, without obvious abnormality, atraumatic Neck: no adenopathy, supple, symmetrical, trachea midline and thyroid normal to inspection and palpation Lungs: clear to auscultation bilaterally Cardiovascular: regular rate and rhythm Breasts: normal appearance, no masses or tenderness, evidence of right lumpectomy Abdomen: soft, non-tender; non distended,  no masses,  no organomegaly Extremities: extremities normal, atraumatic, no cyanosis or edema Skin: Skin color, texture, turgor normal. No rashes or lesions Lymph nodes: Cervical, supraclavicular, and axillary nodes normal. No abnormal inguinal nodes palpated Neurologic: Grossly normal   Pelvic: External genitalia:  no lesions              Urethra:  normal appearing urethra with no masses, tenderness or lesions              Bartholins and Skenes: normal                 Vagina: atrophic appearing vagina with normal color and discharge, no lesions              Cervix: no lesions               Bimanual Exam:  Uterus:  normal size, contour, position, consistency, mobility,  non-tender              Adnexa: no mass, fullness, tenderness               Rectovaginal: Confirms               Anus:  normal sphincter tone, no lesions  Chaperone was present for exam.  A:  Well Woman with normal exam  H/O breast cancer, doing well  H/O osteopenia  H/O vit d def  P:  No pap this year  Colonoscopy UTD  Mammogram UTD  DEXA with Oncologist  Discussed breast self exam  Discussed calcium and vit D intake  Lipids and vit D, other labs with Oncologist

## 2019-03-07 NOTE — Patient Instructions (Signed)

## 2019-03-08 LAB — VITAMIN D 25 HYDROXY (VIT D DEFICIENCY, FRACTURES): Vit D, 25-Hydroxy: 27.7 ng/mL — ABNORMAL LOW (ref 30.0–100.0)

## 2019-03-08 LAB — LIPID PANEL
Chol/HDL Ratio: 4 ratio (ref 0.0–4.4)
Cholesterol, Total: 268 mg/dL — ABNORMAL HIGH (ref 100–199)
HDL: 67 mg/dL (ref >39)
LDL Calculated: 160 mg/dL — ABNORMAL HIGH (ref 0–99)
Triglycerides: 203 mg/dL — ABNORMAL HIGH (ref 0–149)
VLDL Cholesterol Cal: 41 mg/dL — ABNORMAL HIGH (ref 5–40)

## 2019-03-12 ENCOUNTER — Telehealth: Payer: Self-pay

## 2019-03-12 DIAGNOSIS — E7800 Pure hypercholesterolemia, unspecified: Secondary | ICD-10-CM

## 2019-03-12 NOTE — Telephone Encounter (Signed)
Left message to call Cindie Rajagopalan at [phone]. 

## 2019-03-12 NOTE — Telephone Encounter (Signed)
-----   Message from Salvadore Dom, MD sent at 03/09/2019  1:08 PM EDT ----- Her lipid panel is abnormal. Please have her return for a fasting lipid panel.  Her vit d is better than last year, but still low. I would have her increase her current vit d intake by 1,000 IU a day.

## 2019-04-10 NOTE — Telephone Encounter (Signed)
Spoke with patient. Results given. Patient verbalizes understanding. Lab appointment scheduled for 04/13/2019 at 9:45 am. Patient is agreeable to date and time. Encounter closed.

## 2019-04-13 ENCOUNTER — Other Ambulatory Visit: Payer: 59

## 2019-04-20 ENCOUNTER — Other Ambulatory Visit: Payer: 59

## 2019-04-20 ENCOUNTER — Other Ambulatory Visit: Payer: Self-pay

## 2019-04-20 DIAGNOSIS — E7800 Pure hypercholesterolemia, unspecified: Secondary | ICD-10-CM | POA: Diagnosis not present

## 2019-04-21 LAB — LIPID PANEL
Chol/HDL Ratio: 3.6 ratio (ref 0.0–4.4)
Cholesterol, Total: 231 mg/dL — ABNORMAL HIGH (ref 100–199)
HDL: 65 mg/dL
LDL Calculated: 149 mg/dL — ABNORMAL HIGH (ref 0–99)
Triglycerides: 84 mg/dL (ref 0–149)
VLDL Cholesterol Cal: 17 mg/dL (ref 5–40)

## 2019-04-23 MED FILL — VENLAFAXINE HCL ER 75 MG CA: 75 | 90 days supply | Qty: 90 | Fill #3

## 2019-05-16 MED FILL — LETROZOLE 2.5 MG TABLET: 2.5 | 90 days supply | Qty: 90 | Fill #0

## 2019-05-17 ENCOUNTER — Other Ambulatory Visit: Payer: Self-pay | Admitting: *Deleted

## 2019-05-17 MED ORDER — LETROZOLE 2.5 MG PO TABS: 2.5000 mg | ORAL_TABLET | Freq: Every day | ORAL | 3 refills | Status: DC

## 2019-07-19 MED FILL — VENLAFAXINE HCL ER 75 MG CA: 75 | 90 days supply | Qty: 90 | Fill #0

## 2019-08-13 MED FILL — LETROZOLE 2.5 MG TABLET: 2.5 | 90 days supply | Qty: 90 | Fill #1

## 2019-08-13 MED FILL — MINOCYCLINE HCL 75 MG CAPS: 75 | 30 days supply | Qty: 30 | Fill #1

## 2019-09-05 ENCOUNTER — Other Ambulatory Visit: Payer: Self-pay | Admitting: Hematology and Oncology

## 2019-09-05 DIAGNOSIS — Z9889 Other specified postprocedural states: Secondary | ICD-10-CM

## 2019-10-17 MED FILL — VENLAFAXINE HCL ER 75 MG CA: 75 | 90 days supply | Qty: 90 | Fill #1

## 2019-10-22 ENCOUNTER — Other Ambulatory Visit: Payer: Self-pay

## 2019-10-22 ENCOUNTER — Ambulatory Visit
Admission: RE | Admit: 2019-10-22 | Discharge: 2019-10-22 | Disposition: A | Discharge status: Home / Self Care | Payer: 59 | Source: Ambulatory Visit | Attending: Hematology and Oncology | Admitting: Hematology and Oncology

## 2019-10-22 DIAGNOSIS — Z9889 Other specified postprocedural states: Secondary | ICD-10-CM

## 2019-10-22 DIAGNOSIS — Z853 Personal history of malignant neoplasm of breast: Secondary | ICD-10-CM | POA: Diagnosis not present

## 2019-10-22 DIAGNOSIS — R922 Inconclusive mammogram: Secondary | ICD-10-CM | POA: Diagnosis not present

## 2019-11-22 MED FILL — LETROZOLE 2.5 MG TABLET: 2.5 | 90 days supply | Qty: 90 | Fill #2

## 2020-01-23 MED FILL — VENLAFAXINE HCL ER 75 MG CA: 75 | 90 days supply | Qty: 90 | Fill #2

## 2020-02-12 DIAGNOSIS — M545 Low back pain: Secondary | ICD-10-CM | POA: Diagnosis not present

## 2020-02-12 DIAGNOSIS — M9901 Segmental and somatic dysfunction of cervical region: Secondary | ICD-10-CM | POA: Diagnosis not present

## 2020-02-12 DIAGNOSIS — M9903 Segmental and somatic dysfunction of lumbar region: Secondary | ICD-10-CM | POA: Diagnosis not present

## 2020-02-12 DIAGNOSIS — M9902 Segmental and somatic dysfunction of thoracic region: Secondary | ICD-10-CM | POA: Diagnosis not present

## 2020-02-12 DIAGNOSIS — M542 Cervicalgia: Secondary | ICD-10-CM | POA: Diagnosis not present

## 2020-02-12 DIAGNOSIS — S134XXA Sprain of ligaments of cervical spine, initial encounter: Secondary | ICD-10-CM | POA: Diagnosis not present

## 2020-02-12 DIAGNOSIS — S335XXA Sprain of ligaments of lumbar spine, initial encounter: Secondary | ICD-10-CM | POA: Diagnosis not present

## 2020-02-13 ENCOUNTER — Telehealth: Payer: Self-pay | Admitting: Hematology and Oncology

## 2020-02-13 NOTE — Telephone Encounter (Signed)
R/s apt due to 6/14 PAL - unable to reach pt .left message with appt date and time.

## 2020-02-14 DIAGNOSIS — M9903 Segmental and somatic dysfunction of lumbar region: Secondary | ICD-10-CM | POA: Diagnosis not present

## 2020-02-14 DIAGNOSIS — M9902 Segmental and somatic dysfunction of thoracic region: Secondary | ICD-10-CM | POA: Diagnosis not present

## 2020-02-14 DIAGNOSIS — S335XXA Sprain of ligaments of lumbar spine, initial encounter: Secondary | ICD-10-CM | POA: Diagnosis not present

## 2020-02-14 DIAGNOSIS — M9901 Segmental and somatic dysfunction of cervical region: Secondary | ICD-10-CM | POA: Diagnosis not present

## 2020-02-14 DIAGNOSIS — S134XXA Sprain of ligaments of cervical spine, initial encounter: Secondary | ICD-10-CM | POA: Diagnosis not present

## 2020-02-14 DIAGNOSIS — M545 Low back pain: Secondary | ICD-10-CM | POA: Diagnosis not present

## 2020-02-14 DIAGNOSIS — M542 Cervicalgia: Secondary | ICD-10-CM | POA: Diagnosis not present

## 2020-02-21 DIAGNOSIS — M9902 Segmental and somatic dysfunction of thoracic region: Secondary | ICD-10-CM | POA: Diagnosis not present

## 2020-02-21 DIAGNOSIS — M9901 Segmental and somatic dysfunction of cervical region: Secondary | ICD-10-CM | POA: Diagnosis not present

## 2020-02-21 DIAGNOSIS — S134XXA Sprain of ligaments of cervical spine, initial encounter: Secondary | ICD-10-CM | POA: Diagnosis not present

## 2020-02-21 DIAGNOSIS — M545 Low back pain: Secondary | ICD-10-CM | POA: Diagnosis not present

## 2020-02-21 DIAGNOSIS — M542 Cervicalgia: Secondary | ICD-10-CM | POA: Diagnosis not present

## 2020-02-21 DIAGNOSIS — M9903 Segmental and somatic dysfunction of lumbar region: Secondary | ICD-10-CM | POA: Diagnosis not present

## 2020-02-21 DIAGNOSIS — S335XXA Sprain of ligaments of lumbar spine, initial encounter: Secondary | ICD-10-CM | POA: Diagnosis not present

## 2020-02-22 MED FILL — LETROZOLE 2.5 MG TABLET: 2.5 | 90 days supply | Qty: 90 | Fill #3

## 2020-02-25 ENCOUNTER — Other Ambulatory Visit: Payer: Self-pay | Admitting: Hematology and Oncology

## 2020-02-25 ENCOUNTER — Other Ambulatory Visit: Payer: Self-pay | Admitting: *Deleted

## 2020-02-25 DIAGNOSIS — M9901 Segmental and somatic dysfunction of cervical region: Secondary | ICD-10-CM | POA: Diagnosis not present

## 2020-02-25 DIAGNOSIS — S134XXA Sprain of ligaments of cervical spine, initial encounter: Secondary | ICD-10-CM | POA: Diagnosis not present

## 2020-02-25 DIAGNOSIS — M9902 Segmental and somatic dysfunction of thoracic region: Secondary | ICD-10-CM | POA: Diagnosis not present

## 2020-02-25 DIAGNOSIS — S335XXA Sprain of ligaments of lumbar spine, initial encounter: Secondary | ICD-10-CM | POA: Diagnosis not present

## 2020-02-25 DIAGNOSIS — M542 Cervicalgia: Secondary | ICD-10-CM | POA: Diagnosis not present

## 2020-02-25 DIAGNOSIS — M545 Low back pain: Secondary | ICD-10-CM | POA: Diagnosis not present

## 2020-02-25 DIAGNOSIS — M9903 Segmental and somatic dysfunction of lumbar region: Secondary | ICD-10-CM | POA: Diagnosis not present

## 2020-02-25 MED ORDER — LETROZOLE 2.5 MG PO TABS: 2.5000 mg | ORAL_TABLET | Freq: Every day | ORAL | 3 refills | Status: DC

## 2020-02-29 ENCOUNTER — Inpatient Hospital Stay: Payer: 59 | Admitting: Hematology and Oncology

## 2020-03-03 ENCOUNTER — Ambulatory Visit: Payer: 59 | Admitting: Hematology and Oncology

## 2020-03-10 ENCOUNTER — Ambulatory Visit: Payer: 59 | Admitting: Obstetrics and Gynecology

## 2020-03-11 NOTE — Progress Notes (Signed)
54 y.o. G7P3003 Divorced White or Caucasian Not Hispanic or Latino female here for annual exam.   H/o Right breast cancer in 2018, s/p lumpectomy, chemo and radiation. Negative genetic testing.  H/O endometrial ablation, cycles stopped in 2018 with chemotherapy. No vaginal bleeding. Sexually active x 1 in the last year. Declines STD testing.     Patient's last menstrual period was 12/21/2016 (exact date).          Sexually active: No.  The current method of family planning is post menopausal status.    Exercising: Yes.    walking 2 miles a day Smoker:  no  Health Maintenance: Pap:  12/21/2017 WNL NEG HPV, 01-10-12 WNL per patient History of abnormal Pap:  no MMG:  10/22/19 density C Bi-rads 2 benign  BMD:  10/17/2017 Osteopenia, followed by Oncologist  Colonoscopy: 03/31/18 normal TDaP:  01/01/16 Gardasil: N/A   reports that she quit smoking about 21 years ago. Her smoking use included cigarettes. She has never used smokeless tobacco. She reports current alcohol use. She reports that she does not use drugs. 3-4 drinks a week. Works for [employer] in [employer]. Works from home.  3 kids: 21, 26 and 16. Oldest in college, nursing.  Past Medical History:  Diagnosis Date  . Amenorrhea   . Anxiety   . Breast cancer ([place])    right  . Chronic kidney disease    past hx kidney stones   . Elevated liver enzymes   . Fatty liver   . History of chemotherapy    stopped 02-22-2017  . History of kidney stones    yrs ago   . History of removal of Port-a-Cath 02/2017  . Hx of radiation therapy    stopped 04-2017  . Lesion of right lobe of liver   . Osteopenia   . Personal history of chemotherapy   . Personal history of radiation therapy   . Port-A-Cath in place   . Vitamin D deficiency     Past Surgical History:  Procedure Laterality Date  . BREAST LUMPECTOMY Right 11/18/2016   chemo and radiation  . BREAST LUMPECTOMY WITH RADIOACTIVE SEED AND SENTINEL LYMPH NODE BIOPSY Right 11/18/2016   Procedure:  BREAST LUMPECTOMY WITH RADIOACTIVE SEED AND SENTINEL LYMPH NODE BIOPSY;  Surgeon: Autumn Messing III, MD;  Location: [facility];  Service: General;  Laterality: Right;  . DILATATION & CURETTAGE/HYSTEROSCOPY WITH MYOSURE N/A 02/06/2018   Procedure: DILATATION & CURETTAGE/HYSTEROSCOPY *with ultrasound guidance*;  Surgeon: Salvadore Dom, MD;  Location: [facility];  Service: Gynecology;  Laterality: N/A;  need ultrasound guidance due to hx of ablation  . ENDOMETRIAL ABLATION    . HERNIA REPAIR     at birth  . PORT-A-CATH REMOVAL Left 03/14/2017   Procedure: REMOVAL PORT-A-CATH;  Surgeon: Jovita Kussmaul, MD;  Location: [facility];  Service: General;  Laterality: Left;  . PORTACATH PLACEMENT N/A 12/13/2016   Procedure: INSERTION PORT-A-CATH;  Surgeon: Autumn Messing III, MD;  Location: [facility];  Service: General;  Laterality: N/A;    Current Outpatient Medications  Medication Sig Dispense Refill  . Melatonin 3 MG TABS Take by mouth at bedtime.    . Minocycline HCl 90 MG TB24 Take 1 tablet by mouth as needed.  6  . venlafaxine XR (EFFEXOR-XR) 75 MG 24 hr capsule TAKE ONE CAPSULE BY MOUTH DAILY WITH BREAKFAST 90 capsule 3  . letrozole (FEMARA) 2.5 MG tablet Take 1 tablet (2.5 mg total) by mouth daily. [place]  tablet 3   Current Facility-Administered Medications  Medication Dose Route Frequency Provider Last Rate Last Admin  . 0.9 %  sodium chloride infusion  500 mL Intravenous Continuous Gatha Mayer, MD        Family History  Problem Relation Age of Onset  . Breast cancer Maternal Grandmother 37       bilateral  . Glaucoma Mother   . Heart attack Father 38  . Hyperlipidemia Father   . Breast cancer Cousin 29  . Hypertension Sister   . Colon cancer Neg Hx   . Colon polyps Neg Hx   . Esophageal cancer Neg Hx   . Rectal cancer Neg Hx   . Stomach cancer Neg Hx   . Pancreatic cancer Neg Hx     Review of Systems  All other systems reviewed and are  negative.   Exam:   BP 110/68   Pulse 90   Temp 97.9 F (36.6 C)   Ht 5\' 5"  (1.651 m)   Wt 167 lb (75.8 kg)   LMP 12/21/2016 (Exact Date)   SpO2 98%   BMI 27.79 kg/m   Weight change: @WEIGHTCHANGE @ Height:   Height: 5\' 5"  (165.1 cm)  Ht Readings from Last 3 Encounters:  03/12/20 5\' 5"  (1.651 m)  03/07/19 5\' 5"  (1.651 m)  02/22/19 5\' 6"  (1.676 m)    General appearance: alert, cooperative and appears stated age Head: Normocephalic, without obvious abnormality, atraumatic Neck: no adenopathy, supple, symmetrical, trachea midline and thyroid normal to inspection and palpation Lungs: clear to auscultation bilaterally Cardiovascular: regular rate and rhythm Breasts: normal appearance, no masses or tenderness Abdomen: soft, non-tender; non distended,  no masses,  no organomegaly Extremities: extremities normal, atraumatic, no cyanosis or edema Skin: Skin color, texture, turgor normal. No rashes or lesions Lymph nodes: Cervical, supraclavicular, and axillary nodes normal. No abnormal inguinal nodes palpated Neurologic: Grossly normal   Pelvic: External genitalia:  no lesions              Urethra:  normal appearing urethra with no masses, tenderness or lesions              Bartholins and Skenes: normal                 Vagina: atrophic appearing vagina with normal color and discharge, no lesions              Cervix: no lesions               Bimanual Exam:  Uterus:  normal size, contour, position, consistency, mobility, non-tender              Adnexa: no mass, fullness, tenderness               Rectovaginal: Confirms               Anus:  normal sphincter tone, no lesions  Gae Dry chaperoned for the exam.  A:  Well Woman with normal exam  H/O breast cancer  H/o vit d def, not on vit d currently  H/O elevated lipids  H/O weight gain  H/O osteopenia  P:   No pap this year  Mammogram and colonoscopy UTD  DEXA with her Oncologist  Discussed breast self exam  Discussed  calcium and vit D intake  Return for fasting labs, vit d and TSH

## 2020-03-12 ENCOUNTER — Ambulatory Visit (INDEPENDENT_AMBULATORY_CARE_PROVIDER_SITE_OTHER): Payer: 59 | Admitting: Obstetrics and Gynecology

## 2020-03-12 ENCOUNTER — Encounter: Payer: Self-pay | Admitting: Obstetrics and Gynecology

## 2020-03-12 ENCOUNTER — Other Ambulatory Visit: Payer: Self-pay

## 2020-03-12 VITALS — BP 110/68 | HR 90 | Temp 97.9°F | Ht 65.0 in | Wt 167.0 lb

## 2020-03-12 DIAGNOSIS — Z9889 Other specified postprocedural states: Secondary | ICD-10-CM | POA: Diagnosis not present

## 2020-03-12 DIAGNOSIS — Z853 Personal history of malignant neoplasm of breast: Secondary | ICD-10-CM | POA: Diagnosis not present

## 2020-03-12 DIAGNOSIS — E7800 Pure hypercholesterolemia, unspecified: Secondary | ICD-10-CM

## 2020-03-12 DIAGNOSIS — Z01419 Encounter for gynecological examination (general) (routine) without abnormal findings: Secondary | ICD-10-CM | POA: Diagnosis not present

## 2020-03-12 DIAGNOSIS — M8580 Other specified disorders of bone density and structure, unspecified site: Secondary | ICD-10-CM

## 2020-03-12 DIAGNOSIS — E559 Vitamin D deficiency, unspecified: Secondary | ICD-10-CM | POA: Diagnosis not present

## 2020-03-12 DIAGNOSIS — Z0000 Encounter for general adult medical examination without abnormal findings: Secondary | ICD-10-CM

## 2020-03-12 DIAGNOSIS — R635 Abnormal weight gain: Secondary | ICD-10-CM | POA: Diagnosis not present

## 2020-03-12 NOTE — Patient Instructions (Signed)

## 2020-03-13 DIAGNOSIS — S134XXA Sprain of ligaments of cervical spine, initial encounter: Secondary | ICD-10-CM | POA: Diagnosis not present

## 2020-03-13 DIAGNOSIS — M9903 Segmental and somatic dysfunction of lumbar region: Secondary | ICD-10-CM | POA: Diagnosis not present

## 2020-03-13 DIAGNOSIS — M9902 Segmental and somatic dysfunction of thoracic region: Secondary | ICD-10-CM | POA: Diagnosis not present

## 2020-03-13 DIAGNOSIS — M545 Low back pain: Secondary | ICD-10-CM | POA: Diagnosis not present

## 2020-03-13 DIAGNOSIS — S335XXA Sprain of ligaments of lumbar spine, initial encounter: Secondary | ICD-10-CM | POA: Diagnosis not present

## 2020-03-13 DIAGNOSIS — M542 Cervicalgia: Secondary | ICD-10-CM | POA: Diagnosis not present

## 2020-03-13 DIAGNOSIS — M9901 Segmental and somatic dysfunction of cervical region: Secondary | ICD-10-CM | POA: Diagnosis not present

## 2020-03-18 ENCOUNTER — Other Ambulatory Visit: Payer: 59

## 2020-04-03 DIAGNOSIS — S335XXA Sprain of ligaments of lumbar spine, initial encounter: Secondary | ICD-10-CM | POA: Diagnosis not present

## 2020-04-03 DIAGNOSIS — M545 Low back pain: Secondary | ICD-10-CM | POA: Diagnosis not present

## 2020-04-03 DIAGNOSIS — M9903 Segmental and somatic dysfunction of lumbar region: Secondary | ICD-10-CM | POA: Diagnosis not present

## 2020-04-03 DIAGNOSIS — M9901 Segmental and somatic dysfunction of cervical region: Secondary | ICD-10-CM | POA: Diagnosis not present

## 2020-04-03 DIAGNOSIS — M9902 Segmental and somatic dysfunction of thoracic region: Secondary | ICD-10-CM | POA: Diagnosis not present

## 2020-04-03 DIAGNOSIS — M542 Cervicalgia: Secondary | ICD-10-CM | POA: Diagnosis not present

## 2020-04-03 DIAGNOSIS — S134XXA Sprain of ligaments of cervical spine, initial encounter: Secondary | ICD-10-CM | POA: Diagnosis not present

## 2020-04-06 NOTE — Progress Notes (Signed)
Patient Care Team: Patient, No Pcp Per as PCP - General (General Practice) Lovell Sheehan, NP as Nurse Practitioner (Nurse Practitioner) Jovita Kussmaul, MD as Consulting Physician (General Surgery) Nicholas Lose, MD as Consulting Physician (Hematology and Oncology) Kyung Rudd, MD as Consulting Physician (Radiation Oncology) Gardenia Phlegm, NP as Nurse Practitioner (Hematology and Oncology) Salvadore Dom, MD as Consulting Physician (Obstetrics and Gynecology)  DIAGNOSIS:    ICD-10-CM   1. Malignant neoplasm of upper-inner quadrant of right breast in female, estrogen receptor positive ([place])  C50.211    Z17.0     SUMMARY OF ONCOLOGIC HISTORY: Oncology History  Malignant neoplasm of upper-inner quadrant of right breast in female, estrogen receptor positive ([place])  10/20/2016 Initial Diagnosis   Right breast biopsy 12:30 position: IDC grade 3, ER 100%, PR 95%, Ki-67 30%, HER-2 negative ratio 1.31, screening detected right breast asymmetry and calcifications 1.1 cm, right axillary borderline enlarged lymph node biopsy benign, T1c N0 stage IA clinical stage   11/17/2016 Genetic Testing   Testing was normal and did not reveal a mutation.  Genes tested include: APC, ATM, AXIN2, BARD1, BMPR1A, BRCA1, BRCA2, BRIP1, CDH1, CDKN2A, CHEK2, DICER1, EPCAM, GREM1, HOXB13, KIT, MEN1, MLH1, MSH2, MSH6, MUTYH, NBN, NF1, PALB2, PDGFRA, PMS2, POLD1, POLE, PTEN, RAD50, RAD51C, RAD51D, SDHA, SDHB, SDHC, SDHD, SMAD4, SMARCA4, STK11, TP53, TSC1, TSC2, VHL   11/18/2016 Surgery   Right lumpectomy: IDC with DCIS, 1 cm, margins negative, 0/4 lymph nodes, ER 100%, PR 95%, Ki-67 30%, HER-2 negative ratio 1.31, T1 BN 0 stage IA   11/25/2016 Oncotype testing   Oncotype DX score 27: 18% risk of recurrence with tamoxifen alone intermediate risk   12/21/2016 - 02/22/2017 Adjuvant Chemotherapy   Taxotere and Cytoxan x 4 cycles   04/04/2017 - 05/18/2017 Radiation Therapy   Adjuvant radiation therapy   05/18/2017  -  Anti-estrogen oral therapy   Tamoxifen 20 mg daily, switched to letrozole on 02/24/18 due to entering menopause   Genetic testing  11/16/2016 Initial Diagnosis   Genetic testing was negative for mutations in the 43 genes on [facility] Common Cancers panel (APC, ATM, AXIN2, BARD1, BMPR1A, BRCA1, BRCA2, BRIP1, CDH1, CDKN2A, CHEK2, DICER1, EPCAM, GREM1, HOXB13, KIT, MEN1, MLH1, MSH2, MSH6, MUTYH, NBN, NF1, PALB2, PDGFRA, PMS2, POLD1, POLE, PTEN, RAD50, RAD51C, RAD51D, SDHA, SDHB, SDHC, SDHD, SMAD4, SMARCA4, STK11, TP53, TSC1, TSC2, VHL).     CHIEF COMPLIANT: Follow-up of right breast cancer on letrozole therapy  INTERVAL HISTORY: Lisa Anthony is a 54 y.o. with above-mentioned history of right breast cancer treated with lumpectomy, adjuvant chemotherapy, and radiation who is currently on letrozole. Mammogram on 10/22/19 showed no evidence of malignancy bilaterally. She presents to the clinic today for annual follow-up.    ALLERGIES:  is allergic to sulfa antibiotics.  MEDICATIONS:  Current Outpatient Medications  Medication Sig Dispense Refill  . letrozole (FEMARA) 2.5 MG tablet Take 1 tablet (2.5 mg total) by mouth daily. 90 tablet 3  . Melatonin 3 MG TABS Take by mouth at bedtime.    . Minocycline HCl 90 MG TB24 Take 1 tablet by mouth as needed.  6  . venlafaxine XR (EFFEXOR-XR) 75 MG 24 hr capsule TAKE ONE CAPSULE BY MOUTH DAILY WITH BREAKFAST 90 capsule 3   Current Facility-Administered Medications  Medication Dose Route Frequency Provider Last Rate Last Admin  . 0.9 %  sodium chloride infusion  500 mL Intravenous Continuous Gatha Mayer, MD        PHYSICAL EXAMINATION: ECOG PERFORMANCE STATUS: 1 -  Symptomatic but completely ambulatory  Vitals:   04/07/20 1531  BP: (!) 137/95  Pulse: 77  Resp: 18  Temp: 98.7 F (37.1 C)  SpO2: 100%   Filed Weights   04/07/20 1531  Weight: 165 lb 6.4 oz (75 kg)    BREAST: No palpable masses or nodules in either right or left  breasts. No palpable axillary supraclavicular or infraclavicular adenopathy no breast tenderness or nipple discharge. (exam performed in the presence of a chaperone)  LABORATORY DATA:  I have reviewed the data as listed CMP Latest Ref Rng & Units 02/22/2019 02/01/2018 12/26/2017  Glucose 70 - 99 mg/dL 124(H) - 92  BUN 6 - 20 mg/dL 16 - 13  Creatinine 0.44 - 1.00 mg/dL 0.86 - 0.78  Sodium 135 - 145 mmol/L 138 - 141  Potassium 3.5 - 5.1 mmol/L 4.3 - 4.7  Chloride 98 - 111 mmol/L 102 - 102  CO2 22 - 32 mmol/L 22 - 24  Calcium 8.9 - 10.3 mg/dL 9.5 - 9.2  Total Protein 6.5 - 8.1 g/dL 7.7 7.2 5.9(L)  Total Bilirubin 0.3 - 1.2 mg/dL 0.4 0.7 0.5  Alkaline Phos 38 - 126 U/L 121 73 72  AST 15 - 41 U/L 43(H) 69(H) 79(H)  ALT 0 - 44 U/L 57(H) 72(H) 75(H)    Lab Results  Component Value Date   WBC 4.1 02/22/2019   HGB 14.5 02/22/2019   HCT 43.2 02/22/2019   MCV 96.2 02/22/2019   PLT 221 02/22/2019   NEUTROABS 2.5 02/22/2019    ASSESSMENT & PLAN:  Malignant neoplasm of upper-inner quadrant of right breast in female, estrogen receptor positive ([facility]) Right lumpectomy: IDC with DCIS, 1 cm, margins negative, 0/4 lymph nodes, ER 100%, PR 95%, Ki-67 30%, HER-2 negative ratio 1.31, T1 BN 0 stage IA Oncotype DX score 27: 18% risk of recurrence with tamoxifen alone intermediate risk.  Treatment summary: 1.adjuvant chemotherapy with Taxotere and Cytoxan every 3 weeks 4 started 12/21/2016 Completed 02/22/2017 2. radiation therapy07/16/2018-05/18/2017 3. Adjuvant antiestrogen therapywith tamoxifen 20 mg daily started 05/18/2017(last menstrual cycle was April 2018) switched to letrozole 02/24/2018 because she was menopausal  Letrozoletoxicities: 1. Joint stiffness: Even though these are moderate in severity, she is able to tolerate them. 2.   hot flashes on Effexor 75 mg.  Doing very well  Breast Cancer Surveillance: 1. Breast exam7/19/2021:Benign 2. Mammogram2/09/2019: Benign, breast  density category C  Return to clinic in 1 year for follow-up    No orders of the defined types were placed in this encounter.  The patient has a good understanding of the overall plan. she agrees with it. she will call with any problems that may develop before the next visit here.  Total time spent: 20 mins including face to face time and time spent for planning, charting and coordination of care  Nicholas Lose, MD 04/07/2020  I, Cloyde Reams Dorshimer, am acting as scribe for Dr. Nicholas Lose.  I have reviewed the above documentation for accuracy and completeness, and I agree with the above.

## 2020-04-07 ENCOUNTER — Other Ambulatory Visit: Payer: Self-pay

## 2020-04-07 ENCOUNTER — Other Ambulatory Visit: Payer: Self-pay | Admitting: Hematology and Oncology

## 2020-04-07 ENCOUNTER — Inpatient Hospital Stay: Payer: 59 | Attending: Hematology and Oncology | Admitting: Hematology and Oncology

## 2020-04-07 DIAGNOSIS — M2560 Stiffness of unspecified joint, not elsewhere classified: Secondary | ICD-10-CM | POA: Insufficient documentation

## 2020-04-07 DIAGNOSIS — Z79811 Long term (current) use of aromatase inhibitors: Secondary | ICD-10-CM | POA: Insufficient documentation

## 2020-04-07 DIAGNOSIS — Z170 Estrogen receptor positive status [ER+]: Secondary | ICD-10-CM | POA: Diagnosis not present

## 2020-04-07 DIAGNOSIS — Z79899 Other long term (current) drug therapy: Secondary | ICD-10-CM | POA: Insufficient documentation

## 2020-04-07 DIAGNOSIS — R232 Flushing: Secondary | ICD-10-CM | POA: Insufficient documentation

## 2020-04-07 DIAGNOSIS — C50211 Malignant neoplasm of upper-inner quadrant of right female breast: Secondary | ICD-10-CM | POA: Diagnosis not present

## 2020-04-07 MED ORDER — VENLAFAXINE HCL ER 75 MG PO CP24: ORAL_CAPSULE | ORAL | 3 refills | Status: DC

## 2020-04-07 NOTE — Assessment & Plan Note (Signed)
Right lumpectomy: IDC with DCIS, 1 cm, margins negative, 0/4 lymph nodes, ER 100%, PR 95%, Ki-67 30%, HER-2 negative ratio 1.31, T1 BN 0 stage IA Oncotype DX score 27: 18% risk of recurrence with tamoxifen alone intermediate risk.  Treatment summary: 1.adjuvant chemotherapy with Taxotere and Cytoxan every 3 weeks 4 started 12/21/2016 Completed 02/22/2017 2. radiation therapy07/16/2018-05/18/2017 3. Adjuvant antiestrogen therapywith tamoxifen 20 mg daily started 05/18/2017(last menstrual cycle was April 2018) switched to letrozole 02/24/2018 because she was menopausal  Letrozoletoxicities: 1. Joint stiffness: Even though these are moderate in severity, she is able to tolerate them. 2.  Arthritis: On Effexor 75 mg.  Doing very well  Breast Cancer Surveillance: 1. Breast exam7/19/2021:Benign 2. Mammogram2/09/2019: Benign, breast density category C  Return to clinic in 1 year for follow-up

## 2020-04-08 ENCOUNTER — Telehealth: Payer: Self-pay | Admitting: Hematology and Oncology

## 2020-04-08 DIAGNOSIS — S134XXA Sprain of ligaments of cervical spine, initial encounter: Secondary | ICD-10-CM | POA: Diagnosis not present

## 2020-04-08 DIAGNOSIS — M9901 Segmental and somatic dysfunction of cervical region: Secondary | ICD-10-CM | POA: Diagnosis not present

## 2020-04-08 DIAGNOSIS — S335XXA Sprain of ligaments of lumbar spine, initial encounter: Secondary | ICD-10-CM | POA: Diagnosis not present

## 2020-04-08 DIAGNOSIS — M9902 Segmental and somatic dysfunction of thoracic region: Secondary | ICD-10-CM | POA: Diagnosis not present

## 2020-04-08 DIAGNOSIS — M542 Cervicalgia: Secondary | ICD-10-CM | POA: Diagnosis not present

## 2020-04-08 DIAGNOSIS — M545 Low back pain: Secondary | ICD-10-CM | POA: Diagnosis not present

## 2020-04-08 DIAGNOSIS — M9903 Segmental and somatic dysfunction of lumbar region: Secondary | ICD-10-CM | POA: Diagnosis not present

## 2020-04-08 NOTE — Telephone Encounter (Signed)
Scheduled per 7/19 los. Called and left a msg, mailing appt letter and calendar printout  

## 2020-04-23 MED FILL — VENLAFAXINE HCL ER 75 MG CA: 75 | 90 days supply | Qty: 90 | Fill #0

## 2020-04-24 ENCOUNTER — Other Ambulatory Visit: Payer: Self-pay | Admitting: *Deleted

## 2020-04-28 ENCOUNTER — Other Ambulatory Visit: Payer: Self-pay

## 2020-04-28 ENCOUNTER — Other Ambulatory Visit (INDEPENDENT_AMBULATORY_CARE_PROVIDER_SITE_OTHER): Payer: 59

## 2020-04-28 DIAGNOSIS — E559 Vitamin D deficiency, unspecified: Secondary | ICD-10-CM

## 2020-04-28 DIAGNOSIS — R635 Abnormal weight gain: Secondary | ICD-10-CM | POA: Diagnosis not present

## 2020-04-28 DIAGNOSIS — Z0000 Encounter for general adult medical examination without abnormal findings: Secondary | ICD-10-CM

## 2020-04-28 DIAGNOSIS — E7800 Pure hypercholesterolemia, unspecified: Secondary | ICD-10-CM | POA: Diagnosis not present

## 2020-04-29 LAB — CBC
Hematocrit: 46.9 % — ABNORMAL HIGH (ref 34.0–46.6)
Hemoglobin: 15.7 g/dL (ref 11.1–15.9)
MCH: 33.7 pg — ABNORMAL HIGH (ref 26.6–33.0)
MCHC: 33.5 g/dL (ref 31.5–35.7)
MCV: 101 fL — ABNORMAL HIGH (ref 79–97)
Platelets: 295 10*3/uL (ref 150–450)
RBC: 4.66 x10E6/uL (ref 3.77–5.28)
RDW: 12.5 % (ref 11.7–15.4)
WBC: 6.5 10*3/uL (ref 3.4–10.8)

## 2020-04-29 LAB — COMPREHENSIVE METABOLIC PANEL
ALT: 55 IU/L — ABNORMAL HIGH (ref 0–32)
AST: 49 IU/L — ABNORMAL HIGH (ref 0–40)
Albumin/Globulin Ratio: 1.7 (ref 1.2–2.2)
Albumin: 4.4 g/dL (ref 3.8–4.9)
Alkaline Phosphatase: 150 IU/L — ABNORMAL HIGH (ref 48–121)
BUN/Creatinine Ratio: 15 (ref 9–23)
BUN: 11 mg/dL (ref 6–24)
Bilirubin Total: 0.3 mg/dL (ref 0.0–1.2)
CO2: 23 mmol/L (ref 20–29)
Calcium: 9.5 mg/dL (ref 8.7–10.2)
Chloride: 100 mmol/L (ref 96–106)
Creatinine, Ser: 0.74 mg/dL (ref 0.57–1.00)
GFR calc Af Amer: 107 mL/min/{1.73_m2} (ref >59)
GFR calc non Af Amer: 93 mL/min/{1.73_m2} (ref >59)
Globulin, Total: 2.6 g/dL (ref 1.5–4.5)
Glucose: 101 mg/dL — ABNORMAL HIGH (ref 65–99)
Potassium: 5 mmol/L (ref 3.5–5.2)
Sodium: 142 mmol/L (ref 134–144)
Total Protein: 7 g/dL (ref 6.0–8.5)

## 2020-04-29 LAB — LIPID PANEL
Chol/HDL Ratio: 4.9 ratio — ABNORMAL HIGH (ref 0.0–4.4)
Cholesterol, Total: 259 mg/dL — ABNORMAL HIGH (ref 100–199)
HDL: 53 mg/dL (ref >39)
LDL Chol Calc (NIH): 182 mg/dL — ABNORMAL HIGH (ref 0–99)
Triglycerides: 135 mg/dL (ref 0–149)
VLDL Cholesterol Cal: 24 mg/dL (ref 5–40)

## 2020-04-29 LAB — VITAMIN D 25 HYDROXY (VIT D DEFICIENCY, FRACTURES): Vit D, 25-Hydroxy: 28.4 ng/mL — ABNORMAL LOW (ref 30.0–100.0)

## 2020-04-29 LAB — TSH: TSH: 2.23 u[IU]/mL (ref 0.450–4.500)

## 2020-05-12 MED FILL — LETROZOLE 2.5 MG TABLET: 2.5 | 90 days supply | Qty: 90 | Fill #0

## 2020-07-21 MED FILL — VENLAFAXINE HCL ER 75 MG CA: 75 | 90 days supply | Qty: 90 | Fill #1

## 2020-08-18 MED FILL — LETROZOLE 2.5 MG TABLET: 2.5 | 90 days supply | Qty: 90 | Fill #1

## 2020-10-07 ENCOUNTER — Other Ambulatory Visit: Payer: Self-pay | Admitting: Hematology and Oncology

## 2020-10-07 DIAGNOSIS — Z853 Personal history of malignant neoplasm of breast: Secondary | ICD-10-CM

## 2020-10-21 MED FILL — VENLAFAXINE HCL ER 75 MG CA: 75 | 90 days supply | Qty: 90 | Fill #2

## 2020-11-14 ENCOUNTER — Ambulatory Visit
Admission: RE | Admit: 2020-11-14 | Discharge: 2020-11-14 | Disposition: A | Discharge status: Home / Self Care | Payer: 59 | Source: Ambulatory Visit | Attending: Hematology and Oncology | Admitting: Hematology and Oncology

## 2020-11-14 ENCOUNTER — Other Ambulatory Visit: Payer: Self-pay

## 2020-11-14 DIAGNOSIS — Z853 Personal history of malignant neoplasm of breast: Secondary | ICD-10-CM | POA: Diagnosis not present

## 2020-11-14 DIAGNOSIS — R922 Inconclusive mammogram: Secondary | ICD-10-CM | POA: Diagnosis not present

## 2020-11-19 MED FILL — LETROZOLE 2.5 MG TABLET: 2.5 | 90 days supply | Qty: 90 | Fill #2

## 2020-12-11 ENCOUNTER — Other Ambulatory Visit (HOSPITAL_BASED_OUTPATIENT_CLINIC_OR_DEPARTMENT_OTHER): Payer: Self-pay

## 2020-12-12 ENCOUNTER — Other Ambulatory Visit (HOSPITAL_BASED_OUTPATIENT_CLINIC_OR_DEPARTMENT_OTHER): Payer: Self-pay

## 2021-01-19 ENCOUNTER — Other Ambulatory Visit (HOSPITAL_COMMUNITY): Payer: Self-pay

## 2021-01-19 MED FILL — Venlafaxine HCl Cap ER 24HR 75 MG (Base Equivalent): ORAL | 90 days supply | Qty: 90 | Fill #0 | Status: AC

## 2021-01-26 ENCOUNTER — Ambulatory Visit: Payer: 59 | Admitting: Orthopedic Surgery

## 2021-01-26 ENCOUNTER — Ambulatory Visit (INDEPENDENT_AMBULATORY_CARE_PROVIDER_SITE_OTHER): Payer: 59

## 2021-01-26 ENCOUNTER — Encounter: Payer: Self-pay | Admitting: Orthopedic Surgery

## 2021-01-26 VITALS — Ht 65.0 in | Wt 165.0 lb

## 2021-01-26 DIAGNOSIS — M25562 Pain in left knee: Secondary | ICD-10-CM

## 2021-01-26 DIAGNOSIS — M23352 Other meniscus derangements, posterior horn of lateral meniscus, left knee: Secondary | ICD-10-CM

## 2021-01-26 MED ORDER — METHYLPREDNISOLONE ACETATE 40 MG/ML IJ SUSP
40.0000 mg | INTRAMUSCULAR | Status: AC | PRN
  Administered 2021-01-26: 40 mg via INTRA_ARTICULAR

## 2021-01-26 MED ORDER — LIDOCAINE HCL (PF) 1 % IJ SOLN
5.0000 mL | INTRAMUSCULAR | Status: AC | PRN
  Administered 2021-01-26: 5 mL

## 2021-01-26 NOTE — Progress Notes (Signed)
Office Visit Note   Patient: Lisa Anthony           Date of Birth: Jan 24, 1966           MRN: [id number] Visit Date: 01/26/2021              Requested by: No referring provider defined for this encounter. PCP: Patient, No Pcp Per (Inactive)  Chief Complaint  Patient presents with  . Left Knee - Pain, Injury    S/p 33month ago      HPI: Patient is a 55 year old woman who presents with lateral meniscal pain left knee.  Patient states that she was standing on a swivel chair when these chair swiveled and she fell off the chair sustaining a twisting injury to the left knee but no direct impact to her knee.  Patient has been having persistent sharp pain over the lateral joint line that is worse going up and down stairs.  Assessment & Plan: Visit Diagnoses:  1. Acute pain of left knee   2. Internal derangement of knee involving posterior horn of lateral meniscus, left     Plan: Left knee was injected.  Plan to follow-up in 4 weeks.  Discussed that if this is just a contusion to the lateral meniscus the injection should resolve her symptoms if she still has persistent symptoms we will need to get an MRI scan of the left knee to evaluate the location of the meniscal injury.  Patient at this time may require either debridement or meniscal repair.  Follow-Up Instructions: Return in about 4 weeks (around 02/23/2021).   Ortho Exam  Patient is alert, oriented, no adenopathy, well-dressed, normal affect, normal respiratory effort. Examination patient has a mild effusion of the left knee there is no redness no cellulitis no bruising no ecchymosis.  Collaterals are cruciates are stable the patellofemoral joint is minimally tender to palpation medial joint line is nontender to palpation she is point tender to palpation over the medial aspect of the lateral meniscus left knee.  Imaging: No results found. No images are attached to the encounter.  Labs: No results found for: HGBA1C,  ESRSEDRATE, CRP, LABURIC, REPTSTATUS, GRAMSTAIN, CULT, LABORGA   Lab Results  Component Value Date   ALBUMIN 4.4 04/28/2020   ALBUMIN 4.2 02/22/2019   ALBUMIN 4.1 02/01/2018    No results found for: MG Lab Results  Component Value Date   VD25OH 28.4 (L) 04/28/2020   VD25OH 27.7 (L) 03/07/2019   VD25OH 14.2 (L) 12/26/2017    No results found for: PREALBUMIN CBC EXTENDED Latest Ref Rng & Units 04/28/2020 02/22/2019 02/01/2018  WBC 3.4 - 10.8 x10E3/uL 6.5 4.1 4.7  RBC 3.77 - 5.28 x10E6/uL 4.66 4.49 4.21  HGB 11.1 - 15.9 g/dL 15.7 14.5 13.6  HCT 34.0 - 46.6 % 46.9(H) 43.2 40.9  PLT 150 - 450 x10E3/uL 295 221 228  NEUTROABS 1.7 - 7.7 K/uL - 2.5 -  LYMPHSABS 0.7 - 4.0 K/uL - 1.1 -     Body mass index is 27.46 kg/m.  Orders:  Orders Placed This Encounter  Procedures  . XR Knee 1-2 Views Left   No orders of the defined types were placed in this encounter.    Procedures: Large Joint Inj: L knee on 01/26/2021 3:30 PM Indications: pain and diagnostic evaluation Details: 22 G 1.5 in needle, anteromedial approach  Arthrogram: No  Medications: 5 mL lidocaine (PF) 1 %; 40 mg methylPREDNISolone acetate 40 MG/ML Outcome: tolerated well, no immediate complications Procedure, treatment  alternatives, risks and benefits explained, specific risks discussed. Consent was given by the patient. Immediately prior to procedure a time out was called to verify the correct patient, procedure, equipment, support staff and site/side marked as required. Patient was prepped and draped in the usual sterile fashion.      Clinical Data: No additional findings.  ROS:  All other systems negative, except as noted in the HPI. Review of Systems  Objective: Vital Signs: Ht 5\' 5"  (1.651 m)   Wt 165 lb (74.8 kg)   LMP 12/21/2016 (Exact Date)   BMI 27.46 kg/m   Specialty Comments:  No specialty comments available.  PMFS History: Patient Active Problem List   Diagnosis Date Noted  . Osteopenia  12/21/2017  . Vitamin D deficiency 12/21/2017  . Port catheter in place 12/21/2016  . Genetic testing 11/17/2016  . Ductal carcinoma in situ (DCIS) of right breast 11/02/2016  . Malignant neoplasm of upper-inner quadrant of right breast in female, estrogen receptor positive ([place]) 10/27/2016  . Hyperlipidemia 03/06/2013   Past Medical History:  Diagnosis Date  . Amenorrhea   . Anxiety   . Breast cancer ([place])    right  . Chronic kidney disease    past hx kidney stones   . Elevated liver enzymes   . Fatty liver   . History of chemotherapy    stopped 02-22-2017  . History of kidney stones    yrs ago   . History of removal of Port-a-Cath 02/2017  . Hx of radiation therapy    stopped 04-2017  . Lesion of right lobe of liver   . Osteopenia   . Personal history of chemotherapy   . Personal history of radiation therapy   . Port-A-Cath in place   . Vitamin D deficiency     Family History  Problem Relation Age of Onset  . Breast cancer Maternal Grandmother 62       bilateral  . Glaucoma Mother   . Heart attack Father 34  . Hyperlipidemia Father   . Breast cancer Cousin 30  . Hypertension Sister   . Colon cancer Neg Hx   . Colon polyps Neg Hx   . Esophageal cancer Neg Hx   . Rectal cancer Neg Hx   . Stomach cancer Neg Hx   . Pancreatic cancer Neg Hx     Past Surgical History:  Procedure Laterality Date  . BREAST LUMPECTOMY Right 11/18/2016   chemo and radiation  . BREAST LUMPECTOMY WITH RADIOACTIVE SEED AND SENTINEL LYMPH NODE BIOPSY Right 11/18/2016   Procedure: BREAST LUMPECTOMY WITH RADIOACTIVE SEED AND SENTINEL LYMPH NODE BIOPSY;  Surgeon: Autumn Messing III, MD;  Location: [facility];  Service: General;  Laterality: Right;  . DILATATION & CURETTAGE/HYSTEROSCOPY WITH MYOSURE N/A 02/06/2018   Procedure: DILATATION & CURETTAGE/HYSTEROSCOPY *with ultrasound guidance*;  Surgeon: Salvadore Dom, MD;  Location: [facility];  Service: Gynecology;   Laterality: N/A;  need ultrasound guidance due to hx of ablation  . ENDOMETRIAL ABLATION    . HERNIA REPAIR     at birth  . PORT-A-CATH REMOVAL Left 03/14/2017   Procedure: REMOVAL PORT-A-CATH;  Surgeon: Jovita Kussmaul, MD;  Location: [facility];  Service: General;  Laterality: Left;  . PORTACATH PLACEMENT N/A 12/13/2016   Procedure: INSERTION PORT-A-CATH;  Surgeon: Autumn Messing III, MD;  Location: [facility];  Service: General;  Laterality: N/A;   Social History   Occupational History  . Not on file  Tobacco Use  .  Smoking status: Former Smoker    Types: Cigarettes    Quit date: 03/26/1998    Years since quitting: 22.8  . Smokeless tobacco: Never Used  Vaping Use  . Vaping Use: Never used  Substance and Sexual Activity  . Alcohol use: Yes    Comment: socially  . Drug use: No  . Sexual activity: Not Currently    Partners: Male    Birth control/protection: Abstinence

## 2021-02-18 ENCOUNTER — Other Ambulatory Visit (HOSPITAL_COMMUNITY): Payer: Self-pay

## 2021-02-18 MED FILL — Letrozole Tab 2.5 MG: ORAL | 90 days supply | Qty: 90 | Fill #0 | Status: AC

## 2021-03-10 NOTE — Progress Notes (Signed)
55 y.o. G16P3003 Divorced White or Caucasian Not Hispanic or Latino female here for annual exam.   No vaginal bleeding. Vasomotor symptoms controlled with effexor. Not sexually active.    H/o Right breast cancer in 2018, s/p lumpectomy, chemo and radiation. Negative genetic testing.  H/O endometrial ablation, cycles stopped in 2018 with chemotherapy. No vaginal bleeding.  Patient's last menstrual period was 12/21/2016 (exact date).          Sexually active: No.  The current method of family planning is post menopausal status.   Had ablation  Exercising: No   Smoker:  no  Health Maintenance: Pap:  12/21/2017 WNL NEG HPV, 01-10-12 WNL per patient  History of abnormal Pap:  no MMG:  11/14/20 bi-rads 2 benign  BMD:   10/17/17 Osteopenia t score -1.5 Followed by oncologist  Colonoscopy: 03/31/18 normal, f/u 10 years.   TDaP:  01/01/16  Gardasil: NA   reports that she quit smoking about 22 years ago. Her smoking use included cigarettes. She has never used smokeless tobacco. She reports current alcohol use. She reports that she does not use drugs. 4-5 drinks on the weekend. Works for [employer] in [employer]. Works from home. 3 kids: 4, 42 and 7. 2 kids in college, oldest wants to do cosmotology, middle one will start LPN school in the fall. Son will be a senior in [employer], will have his associates in science at the end of HS.   Past Medical History:  Diagnosis Date   Amenorrhea    Anxiety    Breast cancer ([place])    right   Chronic kidney disease    past hx kidney stones    Elevated liver enzymes    Fatty liver    History of chemotherapy    stopped 02-22-2017   History of kidney stones    yrs ago    History of removal of Port-a-Cath 02/2017   Hx of radiation therapy    stopped 04-2017   Lesion of right lobe of liver    Osteopenia    Personal history of chemotherapy    Personal history of radiation therapy    Port-A-Cath in place    Vitamin D deficiency     Past Surgical History:  Procedure Laterality  Date   BREAST LUMPECTOMY Right 11/18/2016   chemo and radiation   BREAST LUMPECTOMY WITH RADIOACTIVE SEED AND SENTINEL LYMPH NODE BIOPSY Right 11/18/2016   Procedure: BREAST LUMPECTOMY WITH RADIOACTIVE SEED AND SENTINEL LYMPH NODE BIOPSY;  Surgeon: Autumn Messing III, MD;  Location: [facility];  Service: General;  Laterality: Right;   DILATATION & CURETTAGE/HYSTEROSCOPY WITH MYOSURE N/A 02/06/2018   Procedure: DILATATION & CURETTAGE/HYSTEROSCOPY *with ultrasound guidance*;  Surgeon: Salvadore Dom, MD;  Location: [place] [facility];  Service: Gynecology;  Laterality: N/A;  need ultrasound guidance due to hx of ablation   ENDOMETRIAL ABLATION     HERNIA REPAIR     at birth   [facility] REMOVAL Left 03/14/2017   Procedure: [place];  Surgeon: Jovita Kussmaul, MD;  Location: [place];  Service: General;  Laterality: Left;   PORTACATH PLACEMENT N/A 12/13/2016   Procedure: INSERTION PORT-A-CATH;  Surgeon: Autumn Messing III, MD;  Location: [place];  Service: General;  Laterality: N/A;    Current Outpatient Medications  Medication Sig Dispense Refill   letrozole (FEMARA) 2.5 MG tablet TAKE 1 TABLET BY MOUTH ONCE DAILY 90 tablet 3   Melatonin 3 MG TABS Take by mouth at bedtime.  Minocycline HCl 90 MG TB24 Take 1 tablet by mouth as needed.  6   venlafaxine XR (EFFEXOR-XR) 75 MG 24 hr capsule TAKE 1 CAPSULE BY MOUTH DAILY WITH BREAKFAST 90 capsule 3   Current Facility-Administered Medications  Medication Dose Route Frequency Provider Last Rate Last Admin   0.9 %  sodium chloride infusion  500 mL Intravenous Continuous Gatha Mayer, MD        Family History  Problem Relation Age of Onset   Breast cancer Maternal Grandmother 72       bilateral   Glaucoma Mother    Heart attack Father 27   Hyperlipidemia Father    Breast cancer Cousin 64   Hypertension Sister    Colon cancer Neg Hx    Colon polyps Neg Hx    Esophageal cancer Neg Hx    Rectal  cancer Neg Hx    Stomach cancer Neg Hx    Pancreatic cancer Neg Hx     Review of Systems  All other systems reviewed and are negative.  Exam:   BP 130/82   Pulse (!) 102   Ht 5\' 6"  (1.676 m)   Wt 170 lb 6.4 oz (77.3 kg)   LMP 12/21/2016 (Exact Date)   SpO2 98%   BMI 27.50 kg/m   Weight change: @WEIGHTCHANGE @ Height:   Height: 5\' 6"  (167.6 cm)  Ht Readings from Last 3 Encounters:  03/16/21 5\' 6"  (1.676 m)  01/26/21 5\' 5"  (1.651 m)  04/07/20 5\' 5"  (1.651 m)    General appearance: alert, cooperative and appears stated age Head: Normocephalic, without obvious abnormality, atraumatic Neck: no adenopathy, supple, symmetrical, trachea midline and thyroid normal to inspection and palpation Lungs: clear to auscultation bilaterally Cardiovascular: regular rate and rhythm Breasts: normal appearance, no masses or tenderness Abdomen: soft, non-tender; non distended,  no masses,  no organomegaly Extremities: extremities normal, atraumatic, no cyanosis or edema Skin: Skin color, texture, turgor normal. No rashes or lesions Lymph nodes: Cervical, supraclavicular, and axillary nodes normal. No abnormal inguinal nodes palpated Neurologic: Grossly normal   Pelvic: External genitalia:  no lesions              Urethra:  normal appearing urethra with no masses, tenderness or lesions              Bartholins and Skenes: normal                 Vagina: normal appearing vagina with normal color and discharge, no lesions              Cervix: no lesions               Bimanual Exam:  Uterus:  normal size, contour, position, consistency, mobility, non-tender and anteverted              Adnexa: no mass, fullness, tenderness               Rectovaginal: Confirms               Anus:  normal sphincter tone, no lesions  Gae Dry chaperoned for the exam.  1. Well woman exam Discussed breast self exam Discussed calcium and vit D intake  2. History of breast cancer Mammogram UTD F/U with  Oncology  3. History of endometrial ablation   4. Osteopenia, unspecified location Followed by Oncology  5. Vitamin D deficiency Not currently taking vit d - VITAMIN D 25 Hydroxy (Vit-D Deficiency, Fractures); Future  6. Elevated cholesterol  Return for fasting lipid panel - Comprehensive metabolic panel; Future  7. Elevated LFTs - Lipid panel; Future  8. Laboratory exam ordered as part of routine general medical examination - CBC; Future - Comprehensive metabolic panel; Future - Lipid panel; Future

## 2021-03-11 ENCOUNTER — Encounter: Payer: Self-pay | Admitting: Hematology and Oncology

## 2021-03-16 ENCOUNTER — Other Ambulatory Visit: Payer: Self-pay

## 2021-03-16 ENCOUNTER — Encounter: Payer: Self-pay | Admitting: Hematology and Oncology

## 2021-03-16 ENCOUNTER — Ambulatory Visit (INDEPENDENT_AMBULATORY_CARE_PROVIDER_SITE_OTHER): Payer: 59 | Admitting: Obstetrics and Gynecology

## 2021-03-16 ENCOUNTER — Encounter: Payer: Self-pay | Admitting: Obstetrics and Gynecology

## 2021-03-16 VITALS — BP 130/82 | HR 102 | Ht 66.0 in | Wt 170.4 lb

## 2021-03-16 DIAGNOSIS — E7800 Pure hypercholesterolemia, unspecified: Secondary | ICD-10-CM | POA: Diagnosis not present

## 2021-03-16 DIAGNOSIS — E559 Vitamin D deficiency, unspecified: Secondary | ICD-10-CM

## 2021-03-16 DIAGNOSIS — Z853 Personal history of malignant neoplasm of breast: Secondary | ICD-10-CM | POA: Diagnosis not present

## 2021-03-16 DIAGNOSIS — Z01419 Encounter for gynecological examination (general) (routine) without abnormal findings: Secondary | ICD-10-CM

## 2021-03-16 DIAGNOSIS — R7989 Other specified abnormal findings of blood chemistry: Secondary | ICD-10-CM | POA: Diagnosis not present

## 2021-03-16 DIAGNOSIS — M8580 Other specified disorders of bone density and structure, unspecified site: Secondary | ICD-10-CM

## 2021-03-16 DIAGNOSIS — Z9889 Other specified postprocedural states: Secondary | ICD-10-CM | POA: Diagnosis not present

## 2021-03-16 DIAGNOSIS — Z0000 Encounter for general adult medical examination without abnormal findings: Secondary | ICD-10-CM | POA: Diagnosis not present

## 2021-03-16 NOTE — Patient Instructions (Signed)

## 2021-04-06 NOTE — Progress Notes (Signed)
Hi Dr. Cyndia Skeeters good good thank you for calling  Patient Care Team: Patient, No Pcp Per (Inactive) as PCP - General (General Practice) Lovell Sheehan, NP as Nurse Practitioner (Nurse Practitioner) Jovita Kussmaul, MD as Consulting Physician (General Surgery) Nicholas Lose, MD as Consulting Physician (Hematology and Oncology) Kyung Rudd, MD as Consulting Physician (Radiation Oncology) Delice Bison, Charlestine Massed, NP as Nurse Practitioner (Hematology and Oncology) Salvadore Dom, MD as Consulting Physician (Obstetrics and Gynecology)  DIAGNOSIS:    ICD-10-CM   1. Malignant neoplasm of upper-inner quadrant of right breast in female, estrogen receptor positive ([place])  C50.211    Z17.0       SUMMARY OF ONCOLOGIC HISTORY: Oncology History  Malignant neoplasm of upper-inner quadrant of right breast in female, estrogen receptor positive ([place])  10/20/2016 Initial Diagnosis   Right breast biopsy 12:30 position: IDC grade 3, ER 100%, PR 95%, Ki-67 30%, HER-2 negative ratio 1.31, screening detected right breast asymmetry and calcifications 1.1 cm, right axillary borderline enlarged lymph node biopsy benign, T1c N0 stage IA clinical stage    11/17/2016 Genetic Testing   Testing was normal and did not reveal a mutation.  Genes tested include: APC, ATM, AXIN2, BARD1, BMPR1A, BRCA1, BRCA2, BRIP1, CDH1, CDKN2A, CHEK2, DICER1, EPCAM, GREM1, HOXB13, KIT, MEN1, MLH1, MSH2, MSH6, MUTYH, NBN, NF1, PALB2, PDGFRA, PMS2, POLD1, POLE, PTEN, RAD50, RAD51C, RAD51D, SDHA, SDHB, SDHC, SDHD, SMAD4, SMARCA4, STK11, TP53, TSC1, TSC2, VHL    11/18/2016 Surgery   Right lumpectomy: IDC with DCIS, 1 cm, margins negative, 0/4 lymph nodes, ER 100%, PR 95%, Ki-67 30%, HER-2 negative ratio 1.31, T1 BN 0 stage IA    11/25/2016 Oncotype testing   Oncotype DX score 27: 18% risk of recurrence with tamoxifen alone intermediate risk    12/21/2016 - 02/22/2017 Adjuvant Chemotherapy   Taxotere and Cytoxan x 4 cycles    04/04/2017 -  05/18/2017 Radiation Therapy   Adjuvant radiation therapy    05/18/2017 -  Anti-estrogen oral therapy   Tamoxifen 20 mg daily, switched to letrozole on 02/24/18 due to entering menopause    Genetic testing  11/16/2016 Initial Diagnosis   Genetic testing was negative for mutations in the 43 genes on [facility]'s Common Cancers panel (APC, ATM, AXIN2, BARD1, BMPR1A, BRCA1, BRCA2, BRIP1, CDH1, CDKN2A, CHEK2, DICER1, EPCAM, GREM1, HOXB13, KIT, MEN1, MLH1, MSH2, MSH6, MUTYH, NBN, NF1, PALB2, PDGFRA, PMS2, POLD1, POLE, PTEN, RAD50, RAD51C, RAD51D, SDHA, SDHB, SDHC, SDHD, SMAD4, SMARCA4, STK11, TP53, TSC1, TSC2, VHL).      CHIEF COMPLIANT: Follow-up of right breast cancer on letrozole therapy  INTERVAL HISTORY: Lisa Anthony is a 55 y.o. with above-mentioned history of right breast cancer treated with lumpectomy, adjuvant chemotherapy, and radiation who is currently on letrozole. Mammogram on 11/14/20 showed no evidence of malignancy bilaterally. She presents to the clinic today for annual follow-up.  Joint stiffness is much improved  ALLERGIES:  is allergic to sulfa antibiotics.  MEDICATIONS:  Current Outpatient Medications  Medication Sig Dispense Refill   letrozole (FEMARA) 2.5 MG tablet TAKE 1 TABLET BY MOUTH ONCE DAILY 90 tablet 3   Melatonin 3 MG TABS Take by mouth at bedtime.     Minocycline HCl 90 MG TB24 Take 1 tablet by mouth as needed.  6   venlafaxine XR (EFFEXOR-XR) 75 MG 24 hr capsule TAKE 1 CAPSULE BY MOUTH DAILY WITH BREAKFAST 90 capsule 3   Current Facility-Administered Medications  Medication Dose Route Frequency Provider Last Rate Last Admin   0.9 %  sodium chloride infusion  500 mL Intravenous Continuous Gatha Mayer, MD        PHYSICAL EXAMINATION: ECOG PERFORMANCE STATUS: 1 - Symptomatic but completely ambulatory  Vitals:   04/07/21 1447  BP: (!) 156/94  Pulse: 93  Resp: 18  Temp: 97.7 F (36.5 C)  SpO2: 98%   Filed Weights   04/07/21 1447  Weight: 170  lb 12.8 oz (77.5 kg)    BREAST: No palpable masses or nodules in either right or left breasts. No palpable axillary supraclavicular or infraclavicular adenopathy no breast tenderness or nipple discharge. (exam performed in the presence of a chaperone)  LABORATORY DATA:  I have reviewed the data as listed CMP Latest Ref Rng & Units 04/28/2020 02/22/2019 02/01/2018  Glucose 65 - 99 mg/dL 101(H) 124(H) -  BUN 6 - 24 mg/dL 11 16 -  Creatinine 0.57 - 1.00 mg/dL 0.74 0.86 -  Sodium 134 - 144 mmol/L 142 138 -  Potassium 3.5 - 5.2 mmol/L 5.0 4.3 -  Chloride 96 - 106 mmol/L 100 102 -  CO2 20 - 29 mmol/L 23 22 -  Calcium 8.7 - 10.2 mg/dL 9.5 9.5 -  Total Protein 6.0 - 8.5 g/dL 7.0 7.7 7.2  Total Bilirubin 0.0 - 1.2 mg/dL 0.3 0.4 0.7  Alkaline Phos 48 - 121 IU/L 150(H) 121 73  AST 0 - 40 IU/L 49(H) 43(H) 69(H)  ALT 0 - 32 IU/L 55(H) 57(H) 72(H)    Lab Results  Component Value Date   WBC 6.5 04/28/2020   HGB 15.7 04/28/2020   HCT 46.9 (H) 04/28/2020   MCV 101 (H) 04/28/2020   PLT 295 04/28/2020   NEUTROABS 2.5 02/22/2019    ASSESSMENT & PLAN:  Malignant neoplasm of upper-inner quadrant of right breast in female, estrogen receptor positive ([facility]) Right lumpectomy: IDC with DCIS, 1 cm, margins negative, 0/4 lymph nodes, ER 100%, PR 95%, Ki-67 30%, HER-2 negative ratio 1.31, T1 BN 0 stage IA Oncotype DX score 27: 18% risk of recurrence with tamoxifen alone intermediate risk.   Treatment summary: 1. adjuvant chemotherapy with Taxotere and Cytoxan every 3 weeks 4  started 12/21/2016 Completed 02/22/2017  2. radiation therapy 04/04/2017 - 05/18/2017 3. Adjuvant antiestrogen therapy with tamoxifen 20 mg daily started 05/18/2017 (last menstrual cycle was April 2018) switched to letrozole 02/24/2018 because she was menopausal   Letrozole toxicities: 1. Joint stiffness: Turmeric appears to be helping her. 2.   hot flashes on Effexor 75 mg.  Doing very well I renewed her prescription for letrozole  for another year.   Breast Cancer Surveillance: 1. Breast exam 04/07/2020: Benign 2. Mammogram 11/14/2020: Benign, breast density category C   Return to clinic in 1 year for follow-up    No orders of the defined types were placed in this encounter.  The patient has a good understanding of the overall plan. she agrees with it. she will call with any problems that may develop before the next visit here.  Total time spent: 20 mins including face to face time and time spent for planning, charting and coordination of care  Rulon Eisenmenger, MD, MPH 04/07/2021  I, Thana Ates, am acting as scribe for Dr. Nicholas Lose.  I have reviewed the above documentation for accuracy and completeness, and I agree with the above.

## 2021-04-07 ENCOUNTER — Other Ambulatory Visit: Payer: Self-pay

## 2021-04-07 ENCOUNTER — Inpatient Hospital Stay: Payer: 59 | Attending: Hematology and Oncology | Admitting: Hematology and Oncology

## 2021-04-07 ENCOUNTER — Other Ambulatory Visit (HOSPITAL_COMMUNITY): Payer: Self-pay

## 2021-04-07 DIAGNOSIS — C50211 Malignant neoplasm of upper-inner quadrant of right female breast: Secondary | ICD-10-CM | POA: Insufficient documentation

## 2021-04-07 DIAGNOSIS — Z79811 Long term (current) use of aromatase inhibitors: Secondary | ICD-10-CM | POA: Diagnosis not present

## 2021-04-07 DIAGNOSIS — Z9221 Personal history of antineoplastic chemotherapy: Secondary | ICD-10-CM | POA: Insufficient documentation

## 2021-04-07 DIAGNOSIS — Z170 Estrogen receptor positive status [ER+]: Secondary | ICD-10-CM | POA: Diagnosis not present

## 2021-04-07 DIAGNOSIS — Z923 Personal history of irradiation: Secondary | ICD-10-CM | POA: Diagnosis not present

## 2021-04-07 MED ORDER — TURMERIC 500 MG PO CAPS: 1.0000 | ORAL_CAPSULE | Freq: Every day | ORAL | Status: DC

## 2021-04-07 MED ORDER — LETROZOLE 2.5 MG PO TABS
ORAL_TABLET | Freq: Every day | ORAL | 3 refills | Status: DC
  Filled 2021-04-07: qty 90, fill #0
  Filled 2021-05-26: qty 90, 90d supply, fill #0
  Filled 2021-08-17: qty 90, 90d supply, fill #1
  Filled 2021-11-24: qty 90, 90d supply, fill #2
  Filled 2022-02-22: qty 90, 90d supply, fill #3

## 2021-04-07 MED ORDER — VENLAFAXINE HCL ER 75 MG PO CP24
ORAL_CAPSULE | Freq: Every day | ORAL | 3 refills | Status: DC
  Filled 2021-04-07: qty 90, 90d supply, fill #0
  Filled 2021-07-23: qty 90, 90d supply, fill #1
  Filled 2021-10-22: qty 90, 90d supply, fill #2
  Filled 2022-01-25: qty 90, 90d supply, fill #3

## 2021-04-07 NOTE — Assessment & Plan Note (Signed)
Right lumpectomy: IDC with DCIS, 1 cm, margins negative, 0/4 lymph nodes, ER 100%, PR 95%, Ki-67 30%, HER-2 negative ratio 1.31, T1 BN 0 stage IA Oncotype DX score 27: 18% risk of recurrence with tamoxifen alone intermediate risk.  Treatment summary: 1.adjuvant chemotherapy with Taxotere and Cytoxan every 3 weeks 4 started 12/21/2016 Completed 02/22/2017 2. radiation therapy07/16/2018-05/18/2017 3. Adjuvant antiestrogen therapywith tamoxifen 20 mg daily started 05/18/2017(last menstrual cycle was April 2018)switched to letrozole 02/24/2018 because she wasmenopausal  Letrozoletoxicities: 1.Joint stiffness: Even though these are moderate in severity, she is able to tolerate them. 2. hot flashes on Effexor 75 mg. Doing very well  Breast Cancer Surveillance: 1. Breast exam7/19/2021:Benign 2. Mammogram2/25/2022: Benign, breast density category C  Return to clinic in 1 year for follow-up

## 2021-05-26 ENCOUNTER — Other Ambulatory Visit (HOSPITAL_COMMUNITY): Payer: Self-pay

## 2021-07-15 ENCOUNTER — Telehealth: Payer: Self-pay | Admitting: Obstetrics and Gynecology

## 2021-07-15 NOTE — Telephone Encounter (Signed)
-----   Message from [place] sent at 06/22/2021  5:38 AM EDT ----- Regarding: Cancellation of Order # [id number] Order number [id number] for the procedure CBC [LAB294] has been  canceled by [employer]te [[email or web address]]. This procedure was ordered  by Salvadore Dom, MD [[id number]] on Mar 16, 2021 for  the patient Lisa Anthony [[id number]]. The reason for  cancellation was "Order Expired".  This was a future order.

## 2021-07-15 NOTE — Telephone Encounter (Signed)
Left message for patient to call.

## 2021-07-15 NOTE — Telephone Encounter (Signed)
Please reach out to the patient to schedule her fasting labs and reorder a CBC, CMP, lipid panel, TSH and vit D (see codes from annual exam).

## 2021-07-22 NOTE — Telephone Encounter (Signed)
Left detailed message on voicemail regarding the below and I asked to call me.

## 2021-07-23 ENCOUNTER — Other Ambulatory Visit (HOSPITAL_COMMUNITY): Payer: Self-pay

## 2021-08-12 NOTE — Telephone Encounter (Signed)
Left message for patient to call regarding labs that Dr.Jertson asked to be drawn.

## 2021-08-17 ENCOUNTER — Other Ambulatory Visit (HOSPITAL_COMMUNITY): Payer: Self-pay

## 2021-08-28 ENCOUNTER — Other Ambulatory Visit: Payer: 59

## 2021-09-10 ENCOUNTER — Other Ambulatory Visit: Payer: 59

## 2021-09-10 ENCOUNTER — Other Ambulatory Visit: Payer: Self-pay | Admitting: Obstetrics and Gynecology

## 2021-09-10 ENCOUNTER — Other Ambulatory Visit: Payer: Self-pay

## 2021-09-10 DIAGNOSIS — E559 Vitamin D deficiency, unspecified: Secondary | ICD-10-CM

## 2021-09-10 DIAGNOSIS — Z0000 Encounter for general adult medical examination without abnormal findings: Secondary | ICD-10-CM

## 2021-09-10 DIAGNOSIS — R7989 Other specified abnormal findings of blood chemistry: Secondary | ICD-10-CM

## 2021-09-10 DIAGNOSIS — E7800 Pure hypercholesterolemia, unspecified: Secondary | ICD-10-CM

## 2021-09-10 NOTE — Progress Notes (Signed)
Fasting lab orders placed

## 2021-09-12 LAB — COMPREHENSIVE METABOLIC PANEL
AG Ratio: 1.6 (calc) (ref 1.0–2.5)
ALT: 81 U/L — ABNORMAL HIGH (ref 6–29)
AST: 54 U/L — ABNORMAL HIGH (ref 10–35)
Albumin: 4.4 g/dL (ref 3.6–5.1)
Alkaline phosphatase (APISO): 100 U/L (ref 37–153)
BUN: 13 mg/dL (ref 7–25)
CO2: 27 mmol/L (ref 20–32)
Calcium: 9.6 mg/dL (ref 8.6–10.4)
Chloride: 101 mmol/L (ref 98–110)
Creat: 0.8 mg/dL (ref 0.50–1.03)
Globulin: 2.7 g/dL (calc) (ref 1.9–3.7)
Glucose, Bld: 108 mg/dL — ABNORMAL HIGH (ref 65–99)
Potassium: 4.9 mmol/L (ref 3.5–5.3)
Sodium: 138 mmol/L (ref 135–146)
Total Bilirubin: 0.7 mg/dL (ref 0.2–1.2)
Total Protein: 7.1 g/dL (ref 6.1–8.1)

## 2021-09-12 LAB — CBC
HCT: 42.7 % (ref 35.0–45.0)
Hemoglobin: 14.8 g/dL (ref 11.7–15.5)
MCH: 32.9 pg (ref 27.0–33.0)
MCHC: 34.7 g/dL (ref 32.0–36.0)
MCV: 94.9 fL (ref 80.0–100.0)
MPV: 10.6 fL (ref 7.5–12.5)
Platelets: 274 10*3/uL (ref 140–400)
RBC: 4.5 10*6/uL (ref 3.80–5.10)
RDW: 11.6 % (ref 11.0–15.0)
WBC: 5.3 10*3/uL (ref 3.8–10.8)

## 2021-09-12 LAB — HEMOGLOBIN A1C
Hgb A1c MFr Bld: 5.6 % of total Hgb (ref <5.7)
Mean Plasma Glucose: 114 mg/dL
eAG (mmol/L): 6.3 mmol/L

## 2021-09-12 LAB — LIPID PANEL
Cholesterol: 273 mg/dL — ABNORMAL HIGH (ref <200)
HDL: 67 mg/dL (ref >=50)
LDL Cholesterol (Calc): 188 mg/dL (calc) — ABNORMAL HIGH
Non-HDL Cholesterol (Calc): 206 mg/dL (calc) — ABNORMAL HIGH (ref <130)
Total CHOL/HDL Ratio: 4.1 (calc) (ref <5.0)
Triglycerides: 75 mg/dL (ref <150)

## 2021-09-12 LAB — VITAMIN D 25 HYDROXY (VIT D DEFICIENCY, FRACTURES): Vit D, 25-Hydroxy: 16 ng/mL — ABNORMAL LOW (ref 30–100)

## 2021-09-16 ENCOUNTER — Other Ambulatory Visit: Payer: Self-pay | Admitting: *Deleted

## 2021-09-16 ENCOUNTER — Other Ambulatory Visit: Payer: Self-pay

## 2021-09-16 DIAGNOSIS — E559 Vitamin D deficiency, unspecified: Secondary | ICD-10-CM

## 2021-09-17 ENCOUNTER — Other Ambulatory Visit: Payer: Self-pay | Admitting: Hematology and Oncology

## 2021-09-17 DIAGNOSIS — Z1231 Encounter for screening mammogram for malignant neoplasm of breast: Secondary | ICD-10-CM

## 2021-10-22 ENCOUNTER — Other Ambulatory Visit (HOSPITAL_COMMUNITY): Payer: Self-pay

## 2021-11-16 ENCOUNTER — Ambulatory Visit
Admission: RE | Admit: 2021-11-16 | Discharge: 2021-11-16 | Disposition: A | Discharge status: Home / Self Care | Payer: 59 | Source: Ambulatory Visit | Attending: Hematology and Oncology | Admitting: Hematology and Oncology

## 2021-11-16 ENCOUNTER — Other Ambulatory Visit: Payer: Self-pay

## 2021-11-16 DIAGNOSIS — Z1231 Encounter for screening mammogram for malignant neoplasm of breast: Secondary | ICD-10-CM

## 2021-11-24 ENCOUNTER — Other Ambulatory Visit (HOSPITAL_COMMUNITY): Payer: Self-pay

## 2021-12-04 ENCOUNTER — Telehealth: Payer: Self-pay | Admitting: Hematology and Oncology

## 2021-12-04 NOTE — Telephone Encounter (Signed)
.  Called patient to schedule appointment per 3/16 inb, patient is aware of date and time.   ?

## 2022-01-25 ENCOUNTER — Other Ambulatory Visit (HOSPITAL_COMMUNITY): Payer: Self-pay

## 2022-02-22 ENCOUNTER — Other Ambulatory Visit (HOSPITAL_COMMUNITY): Payer: Self-pay

## 2022-03-04 NOTE — Progress Notes (Deleted)
56 y.o. [id number] Divorced White or Caucasian Not Hispanic or Latino female here for annual exam.      Patient's last menstrual period was 12/21/2016 (exact date).          Sexually active: {yes no:314532}  The current method of family planning is {contraception:315051}.    Exercising: {yes no:314532}  {types:19826} Smoker:  no  Health Maintenance: Pap:  12-21-17 normal Neg HPV History of abnormal Pap:  no MMG:  11-16-21 normal Cat.B BiRADS 1 BMD:   10-17-17 Osteopenia T -1.5 Colonoscopy: 03-31-18 TDaP:  2017 Gardasil: no   reports that she quit smoking about 23 years ago. Her smoking use included cigarettes. She has never used smokeless tobacco. She reports current alcohol use. She reports that she does not use drugs.  Past Medical History:  Diagnosis Date   Amenorrhea    Anxiety    Breast cancer ([place])    right   Chronic kidney disease    past hx kidney stones    Elevated liver enzymes    Fatty liver    History of chemotherapy    stopped 02-22-2017   History of kidney stones    yrs ago    History of removal of Port-a-Cath 02/2017   Hx of radiation therapy    stopped 04-2017   Lesion of right lobe of liver    Osteopenia    Personal history of chemotherapy    Personal history of radiation therapy    Port-A-Cath in place    Vitamin D deficiency     Past Surgical History:  Procedure Laterality Date   BREAST LUMPECTOMY Right 11/18/2016   chemo and radiation   BREAST LUMPECTOMY WITH RADIOACTIVE SEED AND SENTINEL LYMPH NODE BIOPSY Right 11/18/2016   Procedure: BREAST LUMPECTOMY WITH RADIOACTIVE SEED AND SENTINEL LYMPH NODE BIOPSY;  Surgeon: Autumn Messing III, MD;  Location: [facility];  Service: General;  Laterality: Right;   DILATATION & CURETTAGE/HYSTEROSCOPY WITH MYOSURE N/A 02/06/2018   Procedure: DILATATION & CURETTAGE/HYSTEROSCOPY *with ultrasound guidance*;  Surgeon: Salvadore Dom, MD;  Location: [facility];  Service: Gynecology;  Laterality:  N/A;  need ultrasound guidance due to hx of ablation   ENDOMETRIAL ABLATION     HERNIA REPAIR     at birth   [facility] REMOVAL Left 03/14/2017   Procedure: [place];  Surgeon: Jovita Kussmaul, MD;  Location: [facility];  Service: General;  Laterality: Left;   PORTACATH PLACEMENT N/A 12/13/2016   Procedure: INSERTION PORT-A-CATH;  Surgeon: Autumn Messing III, MD;  Location: [facility];  Service: General;  Laterality: N/A;    Current Outpatient Medications  Medication Sig Dispense Refill   letrozole (FEMARA) 2.5 MG tablet TAKE 1 TABLET BY MOUTH ONCE DAILY 90 tablet 3   Melatonin 3 MG TABS Take by mouth at bedtime.     Minocycline HCl 90 MG TB24 Take 1 tablet by mouth as needed.  6   Turmeric (QC TUMERIC COMPLEX) 500 MG CAPS Take 1 capsule by mouth daily.     venlafaxine XR (EFFEXOR-XR) 75 MG 24 hr capsule TAKE 1 CAPSULE BY MOUTH DAILY WITH BREAKFAST 90 capsule 3   Current Facility-Administered Medications  Medication Dose Route Frequency Provider Last Rate Last Admin   0.9 %  sodium chloride infusion  500 mL Intravenous Continuous Gatha Mayer, MD        Family History  Problem Relation Age of Onset   Breast cancer Maternal Grandmother 49  bilateral   Glaucoma Mother    Heart attack Father 54   Hyperlipidemia Father    Breast cancer Cousin 48   Hypertension Sister    Colon cancer Neg Hx    Colon polyps Neg Hx    Esophageal cancer Neg Hx    Rectal cancer Neg Hx    Stomach cancer Neg Hx    Pancreatic cancer Neg Hx     Review of Systems  Exam:   LMP 12/21/2016 (Exact Date)   Weight change: '@WEIGHTCHANGE'$ @ Height:      Ht Readings from Last 3 Encounters:  04/07/21 '5\' 6"'$  (1.676 m)  03/16/21 '5\' 6"'$  (1.676 m)  01/26/21 '5\' 5"'$  (1.651 m)    General appearance: alert, cooperative and appears stated age Head: Normocephalic, without obvious abnormality, atraumatic Neck: no adenopathy, supple, symmetrical, trachea midline and thyroid {CHL AMB PHY EX THYROID  NORM [phone]::"normal to inspection and palpation"} Lungs: clear to auscultation bilaterally Cardiovascular: regular rate and rhythm Breasts: {Exam; breast:13139::"normal appearance, no masses or tenderness"} Abdomen: soft, non-tender; non distended,  no masses,  no organomegaly Extremities: extremities normal, atraumatic, no cyanosis or edema Skin: Skin color, texture, turgor normal. No rashes or lesions Lymph nodes: Cervical, supraclavicular, and axillary nodes normal. No abnormal inguinal nodes palpated Neurologic: Grossly normal   Pelvic: External genitalia:  no lesions              Urethra:  normal appearing urethra with no masses, tenderness or lesions              Bartholins and Skenes: normal                 Vagina: normal appearing vagina with normal color and discharge, no lesions              Cervix: {CHL AMB PHY EX CERVIX NORM [phone]::"no lesions"}               Bimanual Exam:  Uterus:  {CHL AMB PHY EX UTERUS NORM DEFAULT:[phone]::"normal size, contour, position, consistency, mobility, non-tender"}              Adnexa: {CHL AMB PHY EX ADNEXA NO MASS DEFAULT:[phone]::"no mass, fullness, tenderness"}               Rectovaginal: Confirms               Anus:  normal sphincter tone, no lesions  *** chaperoned for the exam.  A:  Well Woman with normal exam  P:

## 2022-03-18 ENCOUNTER — Ambulatory Visit: Payer: 59 | Admitting: Obstetrics and Gynecology

## 2022-04-07 ENCOUNTER — Ambulatory Visit: Payer: 59 | Admitting: Hematology and Oncology

## 2022-04-16 NOTE — Progress Notes (Signed)
Patient Care Team: Patient, No Pcp Per as PCP - General (General Practice) Lovell Sheehan, NP as Nurse Practitioner (Nurse Practitioner) Jovita Kussmaul, MD as Consulting Physician (General Surgery) Nicholas Lose, MD as Consulting Physician (Hematology and Oncology) Kyung Rudd, MD as Consulting Physician (Radiation Oncology) Delice Bison Charlestine Massed, NP as Nurse Practitioner (Hematology and Oncology) Salvadore Dom, MD as Consulting Physician (Obstetrics and Gynecology)  DIAGNOSIS: No diagnosis found.  SUMMARY OF ONCOLOGIC HISTORY: Oncology History  Malignant neoplasm of upper-inner quadrant of right breast in female, estrogen receptor positive ([place])  10/20/2016 Initial Diagnosis   Right breast biopsy 12:30 position: IDC grade 3, ER 100%, PR 95%, Ki-67 30%, HER-2 negative ratio 1.31, screening detected right breast asymmetry and calcifications 1.1 cm, right axillary borderline enlarged lymph node biopsy benign, T1c N0 stage IA clinical stage   11/17/2016 Genetic Testing   Testing was normal and did not reveal a mutation.  Genes tested include: APC, ATM, AXIN2, BARD1, BMPR1A, BRCA1, BRCA2, BRIP1, CDH1, CDKN2A, CHEK2, DICER1, EPCAM, GREM1, HOXB13, KIT, MEN1, MLH1, MSH2, MSH6, MUTYH, NBN, NF1, PALB2, PDGFRA, PMS2, POLD1, POLE, PTEN, RAD50, RAD51C, RAD51D, SDHA, SDHB, SDHC, SDHD, SMAD4, SMARCA4, STK11, TP53, TSC1, TSC2, VHL   11/18/2016 Surgery   Right lumpectomy: IDC with DCIS, 1 cm, margins negative, 0/4 lymph nodes, ER 100%, PR 95%, Ki-67 30%, HER-2 negative ratio 1.31, T1 BN 0 stage IA   11/25/2016 Oncotype testing   Oncotype DX score 27: 18% risk of recurrence with tamoxifen alone intermediate risk   12/21/2016 - 02/22/2017 Adjuvant Chemotherapy   Taxotere and Cytoxan x 4 cycles   04/04/2017 - 05/18/2017 Radiation Therapy   Adjuvant radiation therapy   05/18/2017 -  Anti-estrogen oral therapy   Tamoxifen 20 mg daily, switched to letrozole on 02/24/18 due to entering menopause   Genetic  testing  11/16/2016 Initial Diagnosis   Genetic testing was negative for mutations in the 43 genes on [employer]'s Common Cancers panel (APC, ATM, AXIN2, BARD1, BMPR1A, BRCA1, BRCA2, BRIP1, CDH1, CDKN2A, CHEK2, DICER1, EPCAM, GREM1, HOXB13, KIT, MEN1, MLH1, MSH2, MSH6, MUTYH, NBN, NF1, PALB2, PDGFRA, PMS2, POLD1, POLE, PTEN, RAD50, RAD51C, RAD51D, SDHA, SDHB, SDHC, SDHD, SMAD4, SMARCA4, STK11, TP53, TSC1, TSC2, VHL).     CHIEF COMPLIANT: Follow-up of right breast cancer on letrozole therapy  INTERVAL HISTORY: Lisa Anthony is a 56 y.o. with above-mentioned history of right breast cancer treated with lumpectomy, adjuvant chemotherapy, and radiation who is currently on letrozole. She presents to the clinic today for a annual follow-up.   ALLERGIES:  is allergic to sulfa antibiotics.  MEDICATIONS:  Current Outpatient Medications  Medication Sig Dispense Refill   letrozole (FEMARA) 2.5 MG tablet TAKE 1 TABLET BY MOUTH ONCE DAILY 90 tablet 3   Melatonin 3 MG TABS Take by mouth at bedtime.     Minocycline HCl 90 MG TB24 Take 1 tablet by mouth as needed.  6   Turmeric (QC TUMERIC COMPLEX) 500 MG CAPS Take 1 capsule by mouth daily.     venlafaxine XR (EFFEXOR-XR) 75 MG 24 hr capsule TAKE 1 CAPSULE BY MOUTH DAILY WITH BREAKFAST 90 capsule 3   Current Facility-Administered Medications  Medication Dose Route Frequency Provider Last Rate Last Admin   0.9 %  sodium chloride infusion  500 mL Intravenous Continuous Gatha Mayer, MD        PHYSICAL EXAMINATION: ECOG PERFORMANCE STATUS: {CHL ONC ECOG RF:[id number]}  There were no vitals filed for this visit. There were no vitals filed for this visit.  BREAST:*** No palpable masses or nodules in either right or left breasts. No palpable axillary supraclavicular or infraclavicular adenopathy no breast tenderness or nipple discharge. (exam performed in the presence of a chaperone)  LABORATORY DATA:  I have reviewed the data as listed    Latest  Ref Rng & Units 09/10/2021   10:53 AM 04/28/2020    9:24 AM 02/22/2019    8:36 AM  CMP  Glucose 65 - 99 mg/dL 108  101  124   BUN 7 - 25 mg/dL '13  11  16   ' Creatinine 0.50 - 1.03 mg/dL 0.80  0.74  0.86   Sodium 135 - 146 mmol/L 138  142  138   Potassium 3.5 - 5.3 mmol/L 4.9  5.0  4.3   Chloride 98 - 110 mmol/L 101  100  102   CO2 20 - 32 mmol/L '27  23  22   ' Calcium 8.6 - 10.4 mg/dL 9.6  9.5  9.5   Total Protein 6.1 - 8.1 g/dL 7.1  7.0  7.7   Total Bilirubin 0.2 - 1.2 mg/dL 0.7  0.3  0.4   Alkaline Phos 48 - 121 IU/L  150  121   AST 10 - 35 U/L 54  49  43   ALT 6 - 29 U/L 81  55  57     Lab Results  Component Value Date   WBC 5.3 09/10/2021   HGB 14.8 09/10/2021   HCT 42.7 09/10/2021   MCV 94.9 09/10/2021   PLT 274 09/10/2021   NEUTROABS 2.5 02/22/2019    ASSESSMENT & PLAN:  No problem-specific Assessment & Plan notes found for this encounter.    No orders of the defined types were placed in this encounter.  The patient has a good understanding of the overall plan. she agrees with it. she will call with any problems that may develop before the next visit here. Total time spent: 30 mins including face to face time and time spent for planning, charting and co-ordination of care   Suzzette Righter, [place] 04/16/22    I Gardiner Coins am scribing for Dr. Lindi Adie  ***

## 2022-04-20 ENCOUNTER — Inpatient Hospital Stay: Payer: 59 | Attending: Hematology and Oncology | Admitting: Hematology and Oncology

## 2022-04-20 ENCOUNTER — Other Ambulatory Visit: Payer: Self-pay

## 2022-04-20 ENCOUNTER — Other Ambulatory Visit (HOSPITAL_COMMUNITY): Payer: Self-pay

## 2022-04-20 VITALS — BP 120/95 | HR 109 | Temp 98.2°F | Resp 18 | Ht 66.0 in | Wt 166.4 lb

## 2022-04-20 DIAGNOSIS — C50211 Malignant neoplasm of upper-inner quadrant of right female breast: Secondary | ICD-10-CM | POA: Diagnosis not present

## 2022-04-20 DIAGNOSIS — Z79811 Long term (current) use of aromatase inhibitors: Secondary | ICD-10-CM | POA: Insufficient documentation

## 2022-04-20 DIAGNOSIS — Z170 Estrogen receptor positive status [ER+]: Secondary | ICD-10-CM

## 2022-04-20 DIAGNOSIS — Z853 Personal history of malignant neoplasm of breast: Secondary | ICD-10-CM | POA: Diagnosis not present

## 2022-04-20 DIAGNOSIS — Z780 Asymptomatic menopausal state: Secondary | ICD-10-CM | POA: Diagnosis not present

## 2022-04-20 MED ORDER — LETROZOLE 2.5 MG PO TABS
ORAL_TABLET | Freq: Every day | ORAL | 3 refills | Status: DC
  Filled 2022-04-20 – 2022-05-18 (×2): qty 90, 90d supply, fill #0
  Filled 2022-08-30: qty 90, 90d supply, fill #1
  Filled 2022-11-23: qty 90, 90d supply, fill #2
  Filled 2023-02-18: qty 90, 90d supply, fill #3

## 2022-04-20 NOTE — Assessment & Plan Note (Addendum)
Right lumpectomy: IDC with DCIS, 1 cm, margins negative, 0/4 lymph nodes, ER 100%, PR 95%, Ki-67 30%, HER-2 negative ratio 1.31, T1 BN 0 stage IA Oncotype DX score 27: 18% risk of recurrence with tamoxifen alone intermediate risk.  Treatment summary: 1.adjuvant chemotherapy with Taxotere and Cytoxan every 3 weeks 4 started 12/21/2016 Completed 02/22/2017 2. radiation therapy07/16/2018-05/18/2017 3. Adjuvant antiestrogen therapywith tamoxifen 20 mg daily started 05/18/2017(last menstrual cycle was April 2018)switched to letrozole 02/24/2018 because she wasmenopausal  Letrozoletoxicities: 1.Joint stiffness: Turmeric appears to be helping her. 2.hot flashes on Effexor 75 mg. Doing very well  We discussed total of 5 years versus 7 years of antiestrogen therapy.  Since she is tolerating it extremely well we decided to continue it for 7 years. I renewed her prescription for letrozole for another year.  Breast Cancer Surveillance: 1. Breast exam8/09/2021:Benign 2. Mammogram2/28/2023: Benign, breast density category B  Return to clinic in 1 year for follow-up

## 2022-04-21 ENCOUNTER — Telehealth: Payer: Self-pay | Admitting: Hematology and Oncology

## 2022-04-21 NOTE — Telephone Encounter (Signed)
Scheduled appointment per 8/1 los. Patient is aware.

## 2022-04-27 ENCOUNTER — Other Ambulatory Visit (HOSPITAL_COMMUNITY): Payer: Self-pay

## 2022-04-27 ENCOUNTER — Other Ambulatory Visit: Payer: Self-pay | Admitting: Hematology and Oncology

## 2022-04-27 DIAGNOSIS — C50211 Malignant neoplasm of upper-inner quadrant of right female breast: Secondary | ICD-10-CM

## 2022-04-27 MED ORDER — VENLAFAXINE HCL ER 75 MG PO CP24
ORAL_CAPSULE | Freq: Every day | ORAL | 3 refills | Status: DC
  Filled 2022-04-27: qty 90, 90d supply, fill #0
  Filled 2022-07-31: qty 90, 90d supply, fill #1
  Filled 2022-10-27: qty 90, 90d supply, fill #2
  Filled 2023-01-27: qty 90, 90d supply, fill #3

## 2022-05-18 ENCOUNTER — Other Ambulatory Visit (HOSPITAL_COMMUNITY): Payer: Self-pay

## 2022-05-19 ENCOUNTER — Other Ambulatory Visit (HOSPITAL_COMMUNITY): Payer: Self-pay

## 2022-08-02 ENCOUNTER — Other Ambulatory Visit (HOSPITAL_COMMUNITY): Payer: Self-pay

## 2022-08-30 ENCOUNTER — Other Ambulatory Visit (HOSPITAL_COMMUNITY): Payer: Self-pay

## 2022-08-31 ENCOUNTER — Other Ambulatory Visit (HOSPITAL_COMMUNITY): Payer: Self-pay

## 2022-09-02 ENCOUNTER — Encounter: Payer: Self-pay | Admitting: Hematology and Oncology

## 2022-09-04 ENCOUNTER — Encounter: Payer: Self-pay | Admitting: Hematology and Oncology

## 2022-10-11 DIAGNOSIS — L814 Other melanin hyperpigmentation: Secondary | ICD-10-CM | POA: Diagnosis not present

## 2022-10-11 DIAGNOSIS — L719 Rosacea, unspecified: Secondary | ICD-10-CM | POA: Diagnosis not present

## 2022-10-20 ENCOUNTER — Other Ambulatory Visit (HOSPITAL_COMMUNITY): Payer: Self-pay

## 2022-10-22 ENCOUNTER — Other Ambulatory Visit (HOSPITAL_COMMUNITY): Payer: Self-pay

## 2022-10-26 ENCOUNTER — Other Ambulatory Visit (HOSPITAL_COMMUNITY): Payer: Self-pay

## 2022-10-26 MED ORDER — MINOCYCLINE HCL 75 MG PO CAPS
75.0000 mg | ORAL_CAPSULE | Freq: Every day | ORAL | 4 refills | Status: AC
  Filled 2022-10-26: qty 30, 30d supply, fill #0
  Filled 2023-01-27: qty 30, 30d supply, fill #1
  Filled 2023-02-18 – 2023-05-02 (×2): qty 30, 30d supply, fill #2

## 2022-10-27 ENCOUNTER — Other Ambulatory Visit: Payer: Self-pay

## 2022-10-28 ENCOUNTER — Other Ambulatory Visit: Payer: Self-pay

## 2022-11-23 ENCOUNTER — Other Ambulatory Visit (HOSPITAL_COMMUNITY): Payer: Self-pay

## 2022-11-24 ENCOUNTER — Other Ambulatory Visit: Payer: Self-pay | Admitting: Hematology and Oncology

## 2022-11-24 DIAGNOSIS — Z1231 Encounter for screening mammogram for malignant neoplasm of breast: Secondary | ICD-10-CM

## 2022-12-08 DIAGNOSIS — L820 Inflamed seborrheic keratosis: Secondary | ICD-10-CM | POA: Diagnosis not present

## 2023-01-10 ENCOUNTER — Ambulatory Visit
Admission: RE | Admit: 2023-01-10 | Discharge: 2023-01-10 | Disposition: A | Discharge status: Home / Self Care | Payer: Commercial Managed Care - PPO | Source: Ambulatory Visit | Attending: Hematology and Oncology | Admitting: Hematology and Oncology

## 2023-01-10 DIAGNOSIS — Z1231 Encounter for screening mammogram for malignant neoplasm of breast: Secondary | ICD-10-CM | POA: Diagnosis not present

## 2023-01-27 ENCOUNTER — Other Ambulatory Visit (HOSPITAL_COMMUNITY): Payer: Self-pay

## 2023-01-27 ENCOUNTER — Encounter: Payer: Self-pay | Admitting: Hematology and Oncology

## 2023-02-15 ENCOUNTER — Encounter: Payer: Self-pay | Admitting: Obstetrics and Gynecology

## 2023-02-15 NOTE — Progress Notes (Signed)
57 y.o. [id number] Divorced White or Caucasian Not Hispanic or Latino female here for annual exam.  No vaginal bleeding. Not dating.   H/o Right breast cancer in 2018, s/p lumpectomy, chemo and radiation. Negative genetic testing.  H/O endometrial ablation, cycles stopped in 2018 with chemotherapy.  She has lost 20 lbs in the last few months.   H/O vit d def, not taking vit d.   Patient's last menstrual period was 12/21/2016 (exact date).          Sexually active: No.  The current method of family planning is post menopausal status.    Exercising: Yes.     Cardio/walking; 30 mins 3-4x a week Smoker: no  Health Maintenance: Pap: 12/21/2017-WNL, HPV- neg, 01/10/2012-WNL per pt History of abnormal Pap: no MMG: 01/10/2023-neg birads 1 BMD: 10/17/2017-osteopenia (T-score -1.5) Colonoscopy: 03/31/2018, Due 03/2028 TDaP: 01/01/2016 Gardasil: never   reports that she quit smoking about 24 years ago. Her smoking use included cigarettes. She has never used smokeless tobacco. She reports current alcohol use. She reports that she does not use drugs. Works for [employer] in [employer]. She has 3 children.   Past Medical History:  Diagnosis Date   Amenorrhea    Anxiety    Breast cancer (HCC)    right   Chronic kidney disease    past hx kidney stones    Elevated liver enzymes    Fatty liver    History of chemotherapy    stopped 02-22-2017   History of kidney stones    yrs ago    History of removal of Port-a-Cath 02/2017   Hx of radiation therapy    stopped 04-2017   Lesion of right lobe of liver    Osteopenia    Personal history of chemotherapy    Personal history of radiation therapy    Port-A-Cath in place    Vitamin D deficiency     Past Surgical History:  Procedure Laterality Date   BREAST LUMPECTOMY Right 11/18/2016   chemo and radiation   BREAST LUMPECTOMY WITH RADIOACTIVE SEED AND SENTINEL LYMPH NODE BIOPSY Right 11/18/2016   Procedure: BREAST LUMPECTOMY WITH RADIOACTIVE SEED AND SENTINEL LYMPH  NODE BIOPSY;  Surgeon: Chevis Pretty III, MD;  Location: [facility];  Service: General;  Laterality: Right;   DILATATION & CURETTAGE/HYSTEROSCOPY WITH MYOSURE N/A 02/06/2018   Procedure: DILATATION & CURETTAGE/HYSTEROSCOPY *with ultrasound guidance*;  Surgeon: Romualdo Bolk, MD;  Location: [facility];  Service: Gynecology;  Laterality: N/A;  need ultrasound guidance due to hx of ablation   ENDOMETRIAL ABLATION     HERNIA REPAIR     at birth   [facility] REMOVAL Left 03/14/2017   Procedure: REMOVAL PORT-A-CATH;  Surgeon: Griselda Miner, MD;  Location: [facility];  Service: General;  Laterality: Left;   PORTACATH PLACEMENT N/A 12/13/2016   Procedure: INSERTION PORT-A-CATH;  Surgeon: Chevis Pretty III, MD;  Location: [facility];  Service: General;  Laterality: N/A;    Current Outpatient Medications  Medication Sig Dispense Refill   letrozole (FEMARA) 2.5 MG tablet TAKE 1 TABLET BY MOUTH ONCE DAILY 90 tablet 3   Melatonin 3 MG TABS Take by mouth at bedtime.     minocycline (MINOCIN) 75 MG capsule Take 1 capsule (75 mg total) by mouth daily with food. 30 capsule 4   venlafaxine XR (EFFEXOR-XR) 75 MG 24 hr capsule TAKE 1 CAPSULE BY MOUTH DAILY WITH BREAKFAST 90 capsule 3   Current Facility-Administered Medications  Medication Dose Route  Frequency Provider Last Rate Last Admin   0.9 %  sodium chloride infusion  500 mL Intravenous Continuous Iva Boop, MD        Family History  Problem Relation Age of Onset   Breast cancer Maternal Grandmother 52       bilateral   Glaucoma Mother    Heart attack Father 72   Hyperlipidemia Father    Breast cancer Cousin 29   Hypertension Sister    Colon cancer Neg Hx    Colon polyps Neg Hx    Esophageal cancer Neg Hx    Rectal cancer Neg Hx    Stomach cancer Neg Hx    Pancreatic cancer Neg Hx     Review of Systems  All other systems reviewed and are negative.   Exam:   BP 118/82   Pulse 81   Ht 5\' 5"   (1.651 m)   Wt 146 lb (66.2 kg)   LMP 12/21/2016 (Exact Date)   SpO2 97%   BMI 24.30 kg/m   Weight change: @WEIGHTCHANGE @ Height:   Height: 5\' 5"  (165.1 cm)  Ht Readings from Last 3 Encounters:  02/18/23 5\' 5"  (1.651 m)  04/20/22 5\' 6"  (1.676 m)  04/07/21 5\' 6"  (1.676 m)    General appearance: alert, cooperative and appears stated age Head: Normocephalic, without obvious abnormality, atraumatic Neck: no adenopathy, supple, symmetrical, trachea midline and thyroid normal to inspection and palpation Lungs: clear to auscultation bilaterally Cardiovascular: regular rate and rhythm Breasts: normal appearance, no masses or tenderness Abdomen: soft, non-tender; non distended,  no masses,  no organomegaly Extremities: extremities normal, atraumatic, no cyanosis or edema Skin: Skin color, texture, turgor normal. No rashes or lesions Lymph nodes: Cervical, supraclavicular, and axillary nodes normal. No abnormal inguinal nodes palpated Neurologic: Grossly normal   Pelvic: External genitalia:  no lesions              Urethra:  normal appearing urethra with no masses, tenderness or lesions              Bartholins and Skenes: normal                 Vagina: atrophic appearing vagina with normal color and discharge, no lesions              Cervix: no lesions               Bimanual Exam:  Uterus:  normal size, contour, position, consistency, mobility, non-tender              Adnexa: no mass, fullness, tenderness               Rectovaginal: Confirms               Anus:  normal sphincter tone, no lesions  Jodelle Red, CMA chaperoned for the exam.  1. Well woman exam Discussed breast self exam Discussed calcium and vit D intake Will establish care with a primary Mammogram is UTD Colonoscopy UTD  2. Elevated cholesterol - Lipid panel (fasting) -Aware she needs to establish care with a Primary  3. Vitamin D deficiency Not taking supplements - VITAMIN D 25 Hydroxy (Vit-D Deficiency,  Fractures)  4. Elevated LFTs - Comprehensive metabolic panel  5. History of breast cancer Mammogram UTD  6. Screening for cervical cancer - Cytology - PAP  7. Osteopenia, unspecified location - DG Bone Density; Future  8. Elevated glucose - Hemoglobin A1c

## 2023-02-18 ENCOUNTER — Encounter: Payer: Self-pay | Admitting: Obstetrics and Gynecology

## 2023-02-18 ENCOUNTER — Ambulatory Visit (INDEPENDENT_AMBULATORY_CARE_PROVIDER_SITE_OTHER): Payer: Commercial Managed Care - PPO | Admitting: Obstetrics and Gynecology

## 2023-02-18 ENCOUNTER — Other Ambulatory Visit (HOSPITAL_COMMUNITY)
Admission: RE | Admit: 2023-02-18 | Discharge: 2023-02-18 | Disposition: A | Discharge status: Home / Self Care | Payer: Commercial Managed Care - PPO | Source: Ambulatory Visit | Attending: Obstetrics and Gynecology | Admitting: Obstetrics and Gynecology

## 2023-02-18 ENCOUNTER — Encounter: Payer: Self-pay | Admitting: Hematology and Oncology

## 2023-02-18 ENCOUNTER — Other Ambulatory Visit (HOSPITAL_COMMUNITY): Payer: Self-pay

## 2023-02-18 VITALS — BP 118/82 | HR 81 | Ht 65.0 in | Wt 146.0 lb

## 2023-02-18 DIAGNOSIS — M8580 Other specified disorders of bone density and structure, unspecified site: Secondary | ICD-10-CM | POA: Diagnosis not present

## 2023-02-18 DIAGNOSIS — Z124 Encounter for screening for malignant neoplasm of cervix: Secondary | ICD-10-CM | POA: Insufficient documentation

## 2023-02-18 DIAGNOSIS — Z853 Personal history of malignant neoplasm of breast: Secondary | ICD-10-CM | POA: Diagnosis not present

## 2023-02-18 DIAGNOSIS — R7989 Other specified abnormal findings of blood chemistry: Secondary | ICD-10-CM

## 2023-02-18 DIAGNOSIS — R7309 Other abnormal glucose: Secondary | ICD-10-CM

## 2023-02-18 DIAGNOSIS — Z01419 Encounter for gynecological examination (general) (routine) without abnormal findings: Secondary | ICD-10-CM

## 2023-02-18 DIAGNOSIS — E559 Vitamin D deficiency, unspecified: Secondary | ICD-10-CM | POA: Diagnosis not present

## 2023-02-18 DIAGNOSIS — E7800 Pure hypercholesterolemia, unspecified: Secondary | ICD-10-CM | POA: Diagnosis not present

## 2023-02-18 NOTE — Patient Instructions (Signed)

## 2023-02-19 LAB — COMPREHENSIVE METABOLIC PANEL
AG Ratio: 1.6 (calc) (ref 1.0–2.5)
ALT: 86 U/L — ABNORMAL HIGH (ref 6–29)
AST: 59 U/L — ABNORMAL HIGH (ref 10–35)
Albumin: 4.5 g/dL (ref 3.6–5.1)
Alkaline phosphatase (APISO): 100 U/L (ref 37–153)
BUN: 7 mg/dL (ref 7–25)
CO2: 26 mmol/L (ref 20–32)
Calcium: 9.7 mg/dL (ref 8.6–10.4)
Chloride: 98 mmol/L (ref 98–110)
Creat: 0.71 mg/dL (ref 0.50–1.03)
Globulin: 2.8 g/dL (calc) (ref 1.9–3.7)
Glucose, Bld: 95 mg/dL (ref 65–99)
Potassium: 3.8 mmol/L (ref 3.5–5.3)
Sodium: 136 mmol/L (ref 135–146)
Total Bilirubin: 0.6 mg/dL (ref 0.2–1.2)
Total Protein: 7.3 g/dL (ref 6.1–8.1)

## 2023-02-19 LAB — HEMOGLOBIN A1C
Hgb A1c MFr Bld: 5.4 % of total Hgb (ref <5.7)
Mean Plasma Glucose: 108 mg/dL
eAG (mmol/L): 6 mmol/L

## 2023-02-19 LAB — VITAMIN D 25 HYDROXY (VIT D DEFICIENCY, FRACTURES): Vit D, 25-Hydroxy: 19 ng/mL — ABNORMAL LOW (ref 30–100)

## 2023-02-19 LAB — LIPID PANEL
Cholesterol: 266 mg/dL — ABNORMAL HIGH (ref <200)
HDL: 68 mg/dL (ref >=50)
LDL Cholesterol (Calc): 177 mg/dL (calc) — ABNORMAL HIGH
Non-HDL Cholesterol (Calc): 198 mg/dL (calc) — ABNORMAL HIGH (ref <130)
Total CHOL/HDL Ratio: 3.9 (calc) (ref <5.0)
Triglycerides: 94 mg/dL (ref <150)

## 2023-02-22 ENCOUNTER — Other Ambulatory Visit: Payer: Self-pay | Admitting: Obstetrics and Gynecology

## 2023-02-22 DIAGNOSIS — R7989 Other specified abnormal findings of blood chemistry: Secondary | ICD-10-CM

## 2023-02-23 LAB — CYTOLOGY - PAP
Comment: NEGATIVE
Diagnosis: NEGATIVE
High risk HPV: NEGATIVE

## 2023-02-25 ENCOUNTER — Encounter: Payer: Self-pay | Admitting: Hematology and Oncology

## 2023-02-25 ENCOUNTER — Other Ambulatory Visit: Payer: Self-pay

## 2023-02-25 ENCOUNTER — Encounter: Payer: Self-pay | Admitting: Pharmacist

## 2023-02-25 ENCOUNTER — Other Ambulatory Visit (HOSPITAL_COMMUNITY): Payer: Self-pay

## 2023-02-25 DIAGNOSIS — E559 Vitamin D deficiency, unspecified: Secondary | ICD-10-CM

## 2023-02-25 MED ORDER — VITAMIN D (ERGOCALCIFEROL) 1.25 MG (50000 UNIT) PO CAPS
50000.0000 [IU] | ORAL_CAPSULE | ORAL | 0 refills | Status: DC
Start: 2023-02-25 — End: 2023-03-02
  Filled 2023-02-25: qty 8, 56d supply, fill #0

## 2023-02-28 ENCOUNTER — Other Ambulatory Visit: Payer: Self-pay

## 2023-03-01 ENCOUNTER — Other Ambulatory Visit (HOSPITAL_COMMUNITY): Payer: Self-pay

## 2023-03-02 ENCOUNTER — Other Ambulatory Visit: Payer: Self-pay

## 2023-03-02 DIAGNOSIS — E559 Vitamin D deficiency, unspecified: Secondary | ICD-10-CM

## 2023-03-02 MED ORDER — VITAMIN D (ERGOCALCIFEROL) 1.25 MG (50000 UNIT) PO CAPS
50000.0000 [IU] | ORAL_CAPSULE | ORAL | 0 refills | Status: AC
Start: 2023-03-02 — End: ?

## 2023-03-02 NOTE — Telephone Encounter (Signed)
Med refill request: vitamin D 1.25mg  50,000 units Last AEX: 02/18/23 Patient wishes to change pharmacy for this prescription from [facility] to [facility]. Refill authorized: Please Advise?

## 2023-03-03 ENCOUNTER — Other Ambulatory Visit: Payer: Self-pay

## 2023-04-18 ENCOUNTER — Encounter: Payer: Self-pay | Admitting: Hematology and Oncology

## 2023-04-25 ENCOUNTER — Other Ambulatory Visit: Payer: Self-pay

## 2023-04-25 ENCOUNTER — Encounter: Payer: Self-pay | Admitting: Hematology and Oncology

## 2023-04-25 ENCOUNTER — Inpatient Hospital Stay: Payer: Managed Care, Other (non HMO) | Attending: Hematology and Oncology | Admitting: Hematology and Oncology

## 2023-04-25 VITALS — BP 129/85 | HR 83 | Temp 97.7°F | Resp 18 | Ht 65.0 in | Wt 135.7 lb

## 2023-04-25 DIAGNOSIS — M2560 Stiffness of unspecified joint, not elsewhere classified: Secondary | ICD-10-CM | POA: Insufficient documentation

## 2023-04-25 DIAGNOSIS — R232 Flushing: Secondary | ICD-10-CM | POA: Insufficient documentation

## 2023-04-25 DIAGNOSIS — C50211 Malignant neoplasm of upper-inner quadrant of right female breast: Secondary | ICD-10-CM | POA: Diagnosis present

## 2023-04-25 DIAGNOSIS — Z170 Estrogen receptor positive status [ER+]: Secondary | ICD-10-CM | POA: Diagnosis not present

## 2023-04-25 DIAGNOSIS — Z79811 Long term (current) use of aromatase inhibitors: Secondary | ICD-10-CM | POA: Insufficient documentation

## 2023-04-25 MED ORDER — LETROZOLE 2.5 MG PO TABS: ORAL_TABLET | Freq: Every day | ORAL | 3 refills | Status: DC

## 2023-04-25 NOTE — Progress Notes (Signed)
Patient Care Team: Patient, No Pcp Per as PCP - General (General Practice) Miquel Dunn, NP as Nurse Practitioner (Nurse Practitioner) Griselda Miner, MD as Consulting Physician (General Surgery) Serena Croissant, MD as Consulting Physician (Hematology and Oncology) Dorothy Puffer, MD as Consulting Physician (Radiation Oncology) Axel Filler Larna Daughters, NP as Nurse Practitioner (Hematology and Oncology) Romualdo Bolk, MD (Inactive) as Consulting Physician (Obstetrics and Gynecology)  DIAGNOSIS:  Encounter Diagnosis  Name Primary?   Malignant neoplasm of upper-inner quadrant of right breast in female, estrogen receptor positive (HCC) Yes    SUMMARY OF ONCOLOGIC HISTORY: Oncology History  Malignant neoplasm of upper-inner quadrant of right breast in female, estrogen receptor positive (HCC)  10/20/2016 Initial Diagnosis   Right breast biopsy 12:30 position: IDC grade 3, ER 100%, PR 95%, Ki-67 30%, HER-2 negative ratio 1.31, screening detected right breast asymmetry and calcifications 1.1 cm, right axillary borderline enlarged lymph node biopsy benign, T1c N0 stage IA clinical stage   11/17/2016 Genetic Testing   Testing was normal and did not reveal a mutation.  Genes tested include: APC, ATM, AXIN2, BARD1, BMPR1A, BRCA1, BRCA2, BRIP1, CDH1, CDKN2A, CHEK2, DICER1, EPCAM, GREM1, HOXB13, KIT, MEN1, MLH1, MSH2, MSH6, MUTYH, NBN, NF1, PALB2, PDGFRA, PMS2, POLD1, POLE, PTEN, RAD50, RAD51C, RAD51D, SDHA, SDHB, SDHC, SDHD, SMAD4, SMARCA4, STK11, TP53, TSC1, TSC2, VHL   11/18/2016 Surgery   Right lumpectomy: IDC with DCIS, 1 cm, margins negative, 0/4 lymph nodes, ER 100%, PR 95%, Ki-67 30%, HER-2 negative ratio 1.31, T1 BN 0 stage IA   11/25/2016 Oncotype testing   Oncotype DX score 27: 18% risk of recurrence with tamoxifen alone intermediate risk   12/21/2016 - 02/22/2017 Adjuvant Chemotherapy   Taxotere and Cytoxan x 4 cycles   04/04/2017 - 05/18/2017 Radiation Therapy   Adjuvant radiation  therapy   05/18/2017 -  Anti-estrogen oral therapy   Tamoxifen 20 mg daily, switched to letrozole on 02/24/18 due to entering menopause   Genetic testing  11/16/2016 Initial Diagnosis   Genetic testing was negative for mutations in the 43 genes on [facility]'s Common Cancers panel (APC, ATM, AXIN2, BARD1, BMPR1A, BRCA1, BRCA2, BRIP1, CDH1, CDKN2A, CHEK2, DICER1, EPCAM, GREM1, HOXB13, KIT, MEN1, MLH1, MSH2, MSH6, MUTYH, NBN, NF1, PALB2, PDGFRA, PMS2, POLD1, POLE, PTEN, RAD50, RAD51C, RAD51D, SDHA, SDHB, SDHC, SDHD, SMAD4, SMARCA4, STK11, TP53, TSC1, TSC2, VHL).     CHIEF COMPLIANT: Follow-up letrozole  INTERVAL HISTORY: Lisa Anthony is a 57 y.o. with above-mentioned history of right breast cancer treated with lumpectomy, adjuvant chemotherapy, and radiation who is currently on letrozole. Patient reports that she has lost at least 30 pounds. She states that she has been walking and watching what she eats. Tolerating letrozole extremely well with no side effects or complaints.  ALLERGIES:  is allergic to sulfa antibiotics.  MEDICATIONS:  Current Outpatient Medications  Medication Sig Dispense Refill   letrozole (FEMARA) 2.5 MG tablet TAKE 1 TABLET BY MOUTH ONCE DAILY 90 tablet 3   Melatonin 3 MG TABS Take by mouth at bedtime.     minocycline (MINOCIN) 75 MG capsule Take 1 capsule (75 mg total) by mouth daily with food. 30 capsule 4   venlafaxine XR (EFFEXOR-XR) 75 MG 24 hr capsule TAKE 1 CAPSULE BY MOUTH DAILY WITH BREAKFAST 90 capsule 3   Vitamin D, Ergocalciferol, (DRISDOL) 1.25 MG (50000 UNIT) CAPS capsule Take 1 capsule (50,000 Units total) by mouth every 7 (seven) days. 8 capsule 0   Current Facility-Administered Medications  Medication Dose Route Frequency Provider Last Rate  Last Admin   0.9 %  sodium chloride infusion  500 mL Intravenous Continuous Iva Boop, MD        PHYSICAL EXAMINATION: ECOG PERFORMANCE STATUS: 1 - Symptomatic but completely ambulatory  Vitals:    04/25/23 1535  BP: 129/85  Pulse: 83  Resp: 18  Temp: 97.7 F (36.5 C)  SpO2: 98%   Filed Weights   04/25/23 1535  Weight: 135 lb 11.2 oz (61.6 kg)    BREAST: No palpable masses or nodules in either right or left breasts. No palpable axillary supraclavicular or infraclavicular adenopathy no breast tenderness or nipple discharge. (exam performed in the presence of a chaperone)  LABORATORY DATA:  I have reviewed the data as listed    Latest Ref Rng & Units 02/18/2023    8:37 AM 09/10/2021   10:53 AM 04/28/2020    9:24 AM  CMP  Glucose 65 - 99 mg/dL 95  409  811   BUN 7 - 25 mg/dL 7  13  11    Creatinine 0.50 - 1.03 mg/dL 9.14  7.82  9.56   Sodium 135 - 146 mmol/L 136  138  142   Potassium 3.5 - 5.3 mmol/L 3.8  4.9  5.0   Chloride 98 - 110 mmol/L 98  101  100   CO2 20 - 32 mmol/L 26  27  23    Calcium 8.6 - 10.4 mg/dL 9.7  9.6  9.5   Total Protein 6.1 - 8.1 g/dL 7.3  7.1  7.0   Total Bilirubin 0.2 - 1.2 mg/dL 0.6  0.7  0.3   Alkaline Phos 48 - 121 IU/L   150   AST 10 - 35 U/L 59  54  49   ALT 6 - 29 U/L 86  81  55     Lab Results  Component Value Date   WBC 5.3 09/10/2021   HGB 14.8 09/10/2021   HCT 42.7 09/10/2021   MCV 94.9 09/10/2021   PLT 274 09/10/2021   NEUTROABS 2.5 02/22/2019    ASSESSMENT & PLAN:  Malignant neoplasm of upper-inner quadrant of right breast in female, estrogen receptor positive (HCC) Right lumpectomy: IDC with DCIS, 1 cm, margins negative, 0/4 lymph nodes, ER 100%, PR 95%, Ki-67 30%, HER-2 negative ratio 1.31, T1 BN 0 stage IA Oncotype DX score 27: 18% risk of recurrence with tamoxifen alone intermediate risk.   Treatment summary: 1. adjuvant chemotherapy with Taxotere and Cytoxan every 3 weeks 4  started 12/21/2016 Completed 02/22/2017  2. radiation therapy 04/04/2017 - 05/18/2017 3. Adjuvant antiestrogen therapy with tamoxifen 20 mg daily started 05/18/2017 (last menstrual cycle was April 2018) switched to letrozole 02/24/2018 because she was  menopausal   Letrozole toxicities: 1. Joint stiffness: Turmeric appears to be helping her. 2.   hot flashes on Effexor 75 mg.  Doing very well   We discussed total of 5 years versus 7 years of antiestrogen therapy.  Since she is tolerating it extremely well we decided to continue it for 7 years. I renewed her prescription for letrozole for another year.   Breast Cancer Surveillance: 1. Breast exam 04/25/2023 benign 2. Mammogram 01/12/2023: Benign, breast density category B   She lost a significant amount of weight by eating healthy and exercising regularly.  She is getting slim so that she can look good for her daughter's wedding in October. Return to clinic in 1 year for follow-up   No orders of the defined types were placed in this encounter.  The  patient has a good understanding of the overall plan. she agrees with it. she will call with any problems that may develop before the next visit here. Total time spent: 30 mins including face to face time and time spent for planning, charting and co-ordination of care   Tamsen Meek, MD 04/25/23    I Janan Ridge am acting as a Neurosurgeon for [employer]  I have reviewed the above documentation for accuracy and completeness, and I agree with the above.

## 2023-04-25 NOTE — Assessment & Plan Note (Signed)
Right lumpectomy: IDC with DCIS, 1 cm, margins negative, 0/4 lymph nodes, ER 100%, PR 95%, Ki-67 30%, HER-2 negative ratio 1.31, T1 BN 0 stage IA Oncotype DX score 27: 18% risk of recurrence with tamoxifen alone intermediate risk.   Treatment summary: 1. adjuvant chemotherapy with Taxotere and Cytoxan every 3 weeks 4  started 12/21/2016 Completed 02/22/2017  2. radiation therapy 04/04/2017 - 05/18/2017 3. Adjuvant antiestrogen therapy with tamoxifen 20 mg daily started 05/18/2017 (last menstrual cycle was April 2018) switched to letrozole 02/24/2018 because she was menopausal   Letrozole toxicities: 1. Joint stiffness: Turmeric appears to be helping her. 2.   hot flashes on Effexor 75 mg.  Doing very well   We discussed total of 5 years versus 7 years of antiestrogen therapy.  Since she is tolerating it extremely well we decided to continue it for 7 years. I renewed her prescription for letrozole for another year.   Breast Cancer Surveillance: 1. Breast exam 04/25/2023 benign 2. Mammogram 01/12/2023: Benign, breast density category B   Return to clinic in 1 year for follow-up

## 2023-05-02 ENCOUNTER — Other Ambulatory Visit: Payer: Self-pay

## 2023-05-02 ENCOUNTER — Other Ambulatory Visit: Payer: Self-pay | Admitting: Hematology and Oncology

## 2023-05-02 DIAGNOSIS — Z170 Estrogen receptor positive status [ER+]: Secondary | ICD-10-CM

## 2023-05-02 MED ORDER — VENLAFAXINE HCL ER 75 MG PO CP24
ORAL_CAPSULE | Freq: Every day | ORAL | 3 refills | Status: DC
Start: 2023-05-02 — End: 2024-04-17

## 2023-05-25 ENCOUNTER — Telehealth: Payer: Self-pay | Admitting: Obstetrics and Gynecology

## 2023-05-25 NOTE — Telephone Encounter (Signed)
Please contact patient and remind her she is due to recheck her vitamin D with repeat blood work.   I have already placed the future order.

## 2023-05-31 NOTE — Telephone Encounter (Signed)
Call placed to patient, left detailed message, ok per dpr.  Advised per Dr. Edward Jolly. Return call to office at [phone], option 1, to schedule lab appt.

## 2023-12-12 ENCOUNTER — Other Ambulatory Visit: Payer: Self-pay | Admitting: Hematology and Oncology

## 2023-12-12 DIAGNOSIS — Z0000 Encounter for general adult medical examination without abnormal findings: Secondary | ICD-10-CM

## 2024-01-12 ENCOUNTER — Ambulatory Visit
Admission: RE | Admit: 2024-01-12 | Discharge: 2024-01-12 | Disposition: A | Discharge status: Home / Self Care | Source: Ambulatory Visit | Attending: Hematology and Oncology

## 2024-01-12 DIAGNOSIS — Z0000 Encounter for general adult medical examination without abnormal findings: Secondary | ICD-10-CM

## 2024-04-17 ENCOUNTER — Other Ambulatory Visit: Payer: Self-pay | Admitting: Hematology and Oncology

## 2024-04-17 DIAGNOSIS — Z170 Estrogen receptor positive status [ER+]: Secondary | ICD-10-CM

## 2024-04-26 ENCOUNTER — Ambulatory Visit: Payer: Managed Care, Other (non HMO) | Admitting: Hematology and Oncology

## 2024-04-30 ENCOUNTER — Other Ambulatory Visit: Payer: Self-pay | Admitting: Hematology and Oncology

## 2024-05-22 ENCOUNTER — Inpatient Hospital Stay: Attending: Hematology and Oncology | Admitting: Hematology and Oncology

## 2024-05-22 VITALS — BP 132/84 | HR 96 | Temp 98.0°F | Resp 18 | Ht 66.0 in | Wt 146.2 lb

## 2024-05-22 DIAGNOSIS — Z853 Personal history of malignant neoplasm of breast: Secondary | ICD-10-CM | POA: Diagnosis not present

## 2024-05-22 DIAGNOSIS — Z170 Estrogen receptor positive status [ER+]: Secondary | ICD-10-CM

## 2024-05-22 DIAGNOSIS — Z9221 Personal history of antineoplastic chemotherapy: Secondary | ICD-10-CM | POA: Insufficient documentation

## 2024-05-22 DIAGNOSIS — Z79899 Other long term (current) drug therapy: Secondary | ICD-10-CM | POA: Insufficient documentation

## 2024-05-22 DIAGNOSIS — R232 Flushing: Secondary | ICD-10-CM | POA: Diagnosis not present

## 2024-05-22 DIAGNOSIS — Z08 Encounter for follow-up examination after completed treatment for malignant neoplasm: Secondary | ICD-10-CM | POA: Diagnosis present

## 2024-05-22 DIAGNOSIS — Z923 Personal history of irradiation: Secondary | ICD-10-CM | POA: Insufficient documentation

## 2024-05-22 DIAGNOSIS — C50211 Malignant neoplasm of upper-inner quadrant of right female breast: Secondary | ICD-10-CM | POA: Diagnosis not present

## 2024-05-22 DIAGNOSIS — N951 Menopausal and female climacteric states: Secondary | ICD-10-CM | POA: Insufficient documentation

## 2024-05-22 NOTE — Progress Notes (Signed)
 Patient Care Team: Patient, No Pcp Per as PCP - General (General Practice) Melvenia Givens, NP as Nurse Practitioner (Nurse Practitioner) Curvin Deward MOULD, MD as Consulting Physician (General Surgery) Odean Potts, MD as Consulting Physician (Hematology and Oncology) Dewey Rush, MD as Consulting Physician (Radiation Oncology) Crawford Morna Pickle, NP as Nurse Practitioner (Hematology and Oncology) Jertson, Jill Evelyn, MD as Consulting Physician (Obstetrics and Gynecology)  DIAGNOSIS:  Encounter Diagnosis  Name Primary?   Malignant neoplasm of upper-inner quadrant of right breast in female, estrogen receptor positive (HCC) Yes    SUMMARY OF ONCOLOGIC HISTORY: Oncology History  Malignant neoplasm of upper-inner quadrant of right breast in female, estrogen receptor positive (HCC)  10/20/2016 Initial Diagnosis   Right breast biopsy 12:30 position: IDC grade 3, ER 100%, PR 95%, Ki-67 30%, HER-2 negative ratio 1.31, screening detected right breast asymmetry and calcifications 1.1 cm, right axillary borderline enlarged lymph node biopsy benign, T1c N0 stage IA clinical stage   11/17/2016 Genetic Testing   Testing was normal and did not reveal a mutation.  Genes tested include: APC, ATM, AXIN2, BARD1, BMPR1A, BRCA1, BRCA2, BRIP1, CDH1, CDKN2A, CHEK2, DICER1, EPCAM, GREM1, HOXB13, KIT, MEN1, MLH1, MSH2, MSH6, MUTYH, NBN, NF1, PALB2, PDGFRA, PMS2, POLD1, POLE, PTEN, RAD50, RAD51C, RAD51D, SDHA, SDHB, SDHC, SDHD, SMAD4, SMARCA4, STK11, TP53, TSC1, TSC2, VHL   11/18/2016 Surgery   Right lumpectomy: IDC with DCIS, 1 cm, margins negative, 0/4 lymph nodes, ER 100%, PR 95%, Ki-67 30%, HER-2 negative ratio 1.31, T1 BN 0 stage IA   11/25/2016 Oncotype testing   Oncotype DX score 27: 18% risk of recurrence with tamoxifen  alone intermediate risk   12/21/2016 - 02/22/2017 Adjuvant Chemotherapy   Taxotere  and Cytoxan  x 4 cycles   04/04/2017 - 05/18/2017 Radiation Therapy   Adjuvant radiation therapy    05/18/2017 -  Anti-estrogen oral therapy   Tamoxifen  20 mg daily, switched to letrozole  on 02/24/18 due to entering menopause   Genetic testing  11/16/2016 Initial Diagnosis   Genetic testing was negative for mutations in the 43 genes on [facility]'s Common Cancers panel (APC, ATM, AXIN2, BARD1, BMPR1A, BRCA1, BRCA2, BRIP1, CDH1, CDKN2A, CHEK2, DICER1, EPCAM, GREM1, HOXB13, KIT, MEN1, MLH1, MSH2, MSH6, MUTYH, NBN, NF1, PALB2, PDGFRA, PMS2, POLD1, POLE, PTEN, RAD50, RAD51C, RAD51D, SDHA, SDHB, SDHC, SDHD, SMAD4, SMARCA4, STK11, TP53, TSC1, TSC2, VHL).     CHIEF COMPLIANT:   HISTORY OF PRESENT ILLNESS: Surveillance of breast cancer on antiestrogen therapy  History of Present Illness Lisa Anthony is a 58 year old female with a history of breast cancer who presents for follow-up regarding hormone therapy treatment.  She has been on tamoxifen  for seven years and is considering discontinuing it, despite having recently received another three-month supply. She is currently taking Effexor  for anxiety and hot flashes, experiencing significant relief from hot flashes. Missing doses for two consecutive days results in severe symptoms. She is contemplating weaning off Effexor  but is uncertain about the timeline.     ALLERGIES:  is allergic to sulfa antibiotics.  MEDICATIONS:  Current Outpatient Medications  Medication Sig Dispense Refill   letrozole  (FEMARA ) 2.5 MG tablet TAKE 1 TABLET DAILY 90 tablet 3   Melatonin 3 MG TABS Take by mouth at bedtime.     minocycline  (MINOCIN ) 75 MG capsule Take 1 capsule (75 mg total) by mouth daily with food. 30 capsule 4   venlafaxine  XR (EFFEXOR -XR) 75 MG 24 hr capsule TAKE 1 CAPSULE DAILY WITH BREAKFAST 90 capsule 3   Vitamin D , Ergocalciferol , (DRISDOL )  1.25 MG (50000 UNIT) CAPS capsule Take 1 capsule (50,000 Units total) by mouth every 7 (seven) days. 8 capsule 0   Current Facility-Administered Medications  Medication Dose Route Frequency Provider Last Rate  Last Admin   0.9 %  sodium chloride  infusion  500 mL Intravenous Continuous Avram Lupita BRAVO, MD        PHYSICAL EXAMINATION: ECOG PERFORMANCE STATUS: 1 - Symptomatic but completely ambulatory  Vitals:   05/22/24 1530  BP: 132/84  Pulse: 96  Resp: 18  Temp: 98 F (36.7 C)  SpO2: 99%   Filed Weights   05/22/24 1530  Weight: 146 lb 3.2 oz (66.3 kg)     LABORATORY DATA:  I have reviewed the data as listed    Latest Ref Rng & Units 02/18/2023    8:37 AM 09/10/2021   10:53 AM 04/28/2020    9:24 AM  CMP  Glucose 65 - 99 mg/dL 95  891  898   BUN 7 - 25 mg/dL 7  13  11    Creatinine 0.50 - 1.03 mg/dL 9.28  9.19  9.25   Sodium 135 - 146 mmol/L 136  138  142   Potassium 3.5 - 5.3 mmol/L 3.8  4.9  5.0   Chloride 98 - 110 mmol/L 98  101  100   CO2 20 - 32 mmol/L 26  27  23    Calcium 8.6 - 10.4 mg/dL 9.7  9.6  9.5   Total Protein 6.1 - 8.1 g/dL 7.3  7.1  7.0   Total Bilirubin 0.2 - 1.2 mg/dL 0.6  0.7  0.3   Alkaline Phos 48 - 121 IU/L   150   AST 10 - 35 U/L 59  54  49   ALT 6 - 29 U/L 86  81  55     Lab Results  Component Value Date   WBC 5.3 09/10/2021   HGB 14.8 09/10/2021   HCT 42.7 09/10/2021   MCV 94.9 09/10/2021   PLT 274 09/10/2021   NEUTROABS 2.5 02/22/2019    ASSESSMENT & PLAN:  Malignant neoplasm of upper-inner quadrant of right breast in female, estrogen receptor positive (HCC) Right lumpectomy: IDC with DCIS, 1 cm, margins negative, 0/4 lymph nodes, ER 100%, PR 95%, Ki-67 30%, HER-2 negative ratio 1.31, T1 BN 0 stage IA Oncotype DX score 27: 18% risk of recurrence with tamoxifen  alone intermediate risk.   Treatment summary: 1. adjuvant chemotherapy with Taxotere  and Cytoxan  every 3 weeks 4  started 12/21/2016 Completed 02/22/2017  2. radiation therapy 04/04/2017 - 05/18/2017 3. Adjuvant antiestrogen therapy with tamoxifen  20 mg daily started 05/18/2017 (last menstrual cycle was April 2018) switched to letrozole  02/24/2018 because she was menopausal.  I  recommended discontinuation of antiestrogen therapy at this time having completed 7 years.   Letrozole  toxicities: 1. Joint stiffness: Turmeric appears to be helping her. 2.   hot flashes on Effexor  75 mg.  Doing very well.    Breast Cancer Surveillance: 1. Breast exam 05/22/24 benign 2. Mammogram 01/16/24: Benign, breast density category B   She lost a significant amount of weight by eating healthy and exercising regularly.   She is expecting her first grandchild to be born this month.  She is very excited about that. Return to clinic on an as-needed basis She will continue her Effexor  treatment and get refills to her primary care physician. ------------------------------------- Assessment and Plan Assessment & Plan Estrogen receptor positive malignant neoplasm of upper-inner quadrant of right breast, status post hormone therapy  Completed seven years of hormone therapy, including one year of tamoxifen . Therapy deemed sufficient, advised to stop hormone therapy. - Discontinue hormone therapy. - Continue annual mammograms.  Vasomotor symptoms (hot flashes) associated with menopause Hot flashes managed with venlafaxine , providing significant symptom relief. - Continue venlafaxine  for hot flashes. - Discuss tapering off venlafaxine  if desired, by reducing to half dose for a month, then every other day for another month before stopping.  Anxiety symptoms Anxiety symptoms managed with venlafaxine . She has a year's worth of refills available until July 2026. - Continue venlafaxine  for anxiety. - Discuss tapering off venlafaxine  if desired, by reducing to half dose for a month, then every other day for another month before stopping.      No orders of the defined types were placed in this encounter.  The patient has a good understanding of the overall plan. she agrees with it. she will call with any problems that may develop before the next visit here. Total time spent: 30 mins including  face to face time and time spent for planning, charting and co-ordination of care   Naomi MARLA Chad, MD 05/22/24

## 2024-05-22 NOTE — Assessment & Plan Note (Signed)
 Right lumpectomy: IDC with DCIS, 1 cm, margins negative, 0/4 lymph nodes, ER 100%, PR 95%, Ki-67 30%, HER-2 negative ratio 1.31, T1 BN 0 stage IA Oncotype DX score 27: 18% risk of recurrence with tamoxifen  alone intermediate risk.   Treatment summary: 1. adjuvant chemotherapy with Taxotere  and Cytoxan  every 3 weeks 4  started 12/21/2016 Completed 02/22/2017  2. radiation therapy 04/04/2017 - 05/18/2017 3. Adjuvant antiestrogen therapy with tamoxifen  20 mg daily started 05/18/2017 (last menstrual cycle was April 2018) switched to letrozole  02/24/2018 because she was menopausal   Letrozole  toxicities: 1. Joint stiffness: Turmeric appears to be helping her. 2.   hot flashes on Effexor  75 mg.  Doing very well  Breast Cancer Surveillance: 1. Breast exam 05/22/24 benign 2. Mammogram 01/16/24: Benign, breast density category B   She lost a significant amount of weight by eating healthy and exercising regularly.  She is getting slim so that she can look good for her daughter's wedding in October. Return to clinic in 1 year for follow-up
